# Patient Record
Sex: Female | Born: 1950 | Race: White | Hispanic: No | Smoking: Former smoker
Health system: Southern US, Community
[De-identification: ages and names within clinical notes are randomized; demographics above are authoritative.]

## PROBLEM LIST (undated history)

## (undated) DIAGNOSIS — K219 Gastro-esophageal reflux disease without esophagitis: Secondary | ICD-10-CM

## (undated) DIAGNOSIS — J439 Emphysema, unspecified: Secondary | ICD-10-CM

## (undated) DIAGNOSIS — J45909 Unspecified asthma, uncomplicated: Secondary | ICD-10-CM

## (undated) DIAGNOSIS — M797 Fibromyalgia: Secondary | ICD-10-CM

## (undated) DIAGNOSIS — R569 Unspecified convulsions: Secondary | ICD-10-CM

## (undated) DIAGNOSIS — E079 Disorder of thyroid, unspecified: Secondary | ICD-10-CM

## (undated) DIAGNOSIS — G709 Myoneural disorder, unspecified: Secondary | ICD-10-CM

## (undated) DIAGNOSIS — M1990 Unspecified osteoarthritis, unspecified site: Secondary | ICD-10-CM

## (undated) DIAGNOSIS — I10 Essential (primary) hypertension: Secondary | ICD-10-CM

## (undated) DIAGNOSIS — E785 Hyperlipidemia, unspecified: Secondary | ICD-10-CM

## (undated) DIAGNOSIS — F419 Anxiety disorder, unspecified: Secondary | ICD-10-CM

## (undated) DIAGNOSIS — D649 Anemia, unspecified: Secondary | ICD-10-CM

## (undated) DIAGNOSIS — A809 Acute poliomyelitis, unspecified: Secondary | ICD-10-CM

## (undated) HISTORY — DX: Emphysema, unspecified: J43.9

## (undated) HISTORY — DX: Hyperlipidemia, unspecified: E78.5

## (undated) HISTORY — DX: Myoneural disorder, unspecified: G70.9

## (undated) HISTORY — PX: HIATAL HERNIA REPAIR: SHX195

## (undated) HISTORY — DX: Acute poliomyelitis, unspecified: A80.9

## (undated) HISTORY — DX: Anemia, unspecified: D64.9

## (undated) HISTORY — DX: Unspecified asthma, uncomplicated: J45.909

## (undated) HISTORY — DX: Unspecified convulsions: R56.9

## (undated) HISTORY — DX: Essential (primary) hypertension: I10

## (undated) HISTORY — DX: Unspecified osteoarthritis, unspecified site: M19.90

## (undated) HISTORY — PX: APPENDECTOMY: SHX54

## (undated) HISTORY — DX: Anxiety disorder, unspecified: F41.9

## (undated) HISTORY — DX: Fibromyalgia: M79.7

## (undated) HISTORY — DX: Gastro-esophageal reflux disease without esophagitis: K21.9

## (undated) HISTORY — DX: Disorder of thyroid, unspecified: E07.9

---
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
## 1990-07-30 HISTORY — PX: ABDOMINAL HYSTERECTOMY: SHX81

## 1998-01-19 HISTORY — PX: BREAST EXCISIONAL BIOPSY: SUR124

## 1999-04-14 ENCOUNTER — Other Ambulatory Visit: Admission: RE | Admit: 1999-04-14 | Discharge: 1999-04-14 | Payer: Self-pay | Admitting: *Deleted

## 1999-04-16 ENCOUNTER — Other Ambulatory Visit: Admission: RE | Admit: 1999-04-16 | Discharge: 1999-04-16 | Payer: Self-pay | Admitting: *Deleted

## 2000-01-23 ENCOUNTER — Ambulatory Visit (HOSPITAL_COMMUNITY): Admission: RE | Admit: 2000-01-23 | Discharge: 2000-01-23 | Payer: Self-pay | Admitting: Family Medicine

## 2001-02-19 ENCOUNTER — Encounter: Payer: Self-pay | Admitting: *Deleted

## 2001-02-19 ENCOUNTER — Encounter: Admission: RE | Admit: 2001-02-19 | Discharge: 2001-02-19 | Payer: Self-pay | Admitting: *Deleted

## 2002-02-09 ENCOUNTER — Encounter: Payer: Self-pay | Admitting: Family Medicine

## 2002-02-09 ENCOUNTER — Encounter: Admission: RE | Admit: 2002-02-09 | Discharge: 2002-02-09 | Payer: Self-pay | Admitting: Family Medicine

## 2002-06-04 ENCOUNTER — Encounter: Payer: Self-pay | Admitting: Anesthesiology

## 2002-06-10 ENCOUNTER — Encounter (INDEPENDENT_AMBULATORY_CARE_PROVIDER_SITE_OTHER): Payer: Self-pay | Admitting: Specialist

## 2002-06-10 ENCOUNTER — Inpatient Hospital Stay (HOSPITAL_COMMUNITY): Admission: RE | Admit: 2002-06-10 | Discharge: 2002-06-13 | Payer: Self-pay | Admitting: Obstetrics and Gynecology

## 2002-07-30 HISTORY — PX: FEMUR DISTAL LOCKING SCREW INSERTION: SHX1593

## 2002-07-30 HISTORY — PX: OOPHORECTOMY: SHX86

## 2002-08-25 ENCOUNTER — Encounter: Admission: RE | Admit: 2002-08-25 | Discharge: 2002-08-25 | Payer: Self-pay | Admitting: Obstetrics and Gynecology

## 2002-08-25 ENCOUNTER — Encounter: Payer: Self-pay | Admitting: Obstetrics and Gynecology

## 2003-02-03 ENCOUNTER — Ambulatory Visit (HOSPITAL_BASED_OUTPATIENT_CLINIC_OR_DEPARTMENT_OTHER): Admission: RE | Admit: 2003-02-03 | Discharge: 2003-02-03 | Payer: Self-pay | Admitting: Orthopedic Surgery

## 2004-03-01 ENCOUNTER — Inpatient Hospital Stay (HOSPITAL_COMMUNITY): Admission: AD | Admit: 2004-03-01 | Discharge: 2004-03-02 | Payer: Self-pay | Admitting: Family Medicine

## 2004-03-17 ENCOUNTER — Encounter: Admission: RE | Admit: 2004-03-17 | Discharge: 2004-03-17 | Payer: Self-pay | Admitting: Family Medicine

## 2006-12-02 ENCOUNTER — Ambulatory Visit: Payer: Self-pay | Admitting: Nurse Practitioner

## 2006-12-04 ENCOUNTER — Ambulatory Visit (HOSPITAL_COMMUNITY): Admission: RE | Admit: 2006-12-04 | Discharge: 2006-12-04 | Payer: Self-pay | Admitting: Nurse Practitioner

## 2006-12-04 ENCOUNTER — Ambulatory Visit (HOSPITAL_COMMUNITY): Admission: RE | Admit: 2006-12-04 | Discharge: 2006-12-04 | Payer: Self-pay | Admitting: Family Medicine

## 2006-12-04 ENCOUNTER — Ambulatory Visit: Payer: Self-pay | Admitting: *Deleted

## 2006-12-09 ENCOUNTER — Ambulatory Visit: Payer: Self-pay | Admitting: Nurse Practitioner

## 2006-12-10 ENCOUNTER — Encounter: Admission: RE | Admit: 2006-12-10 | Discharge: 2006-12-10 | Payer: Self-pay | Admitting: Family Medicine

## 2006-12-31 ENCOUNTER — Inpatient Hospital Stay (HOSPITAL_COMMUNITY): Admission: EM | Admit: 2006-12-31 | Discharge: 2007-01-01 | Payer: Self-pay | Admitting: Emergency Medicine

## 2007-01-10 ENCOUNTER — Ambulatory Visit: Payer: Self-pay | Admitting: Internal Medicine

## 2007-01-23 ENCOUNTER — Ambulatory Visit: Admission: RE | Admit: 2007-01-23 | Discharge: 2007-01-23 | Payer: Self-pay | Admitting: Family Medicine

## 2007-02-03 ENCOUNTER — Ambulatory Visit: Payer: Self-pay | Admitting: Family Medicine

## 2007-02-03 ENCOUNTER — Encounter: Admission: RE | Admit: 2007-02-03 | Discharge: 2007-02-03 | Payer: Self-pay | Admitting: Family Medicine

## 2007-02-04 ENCOUNTER — Ambulatory Visit: Payer: Self-pay | Admitting: Family Medicine

## 2007-02-07 ENCOUNTER — Ambulatory Visit (HOSPITAL_COMMUNITY): Admission: RE | Admit: 2007-02-07 | Discharge: 2007-02-07 | Payer: Self-pay | Admitting: Cardiology

## 2007-02-17 ENCOUNTER — Encounter: Admission: RE | Admit: 2007-02-17 | Discharge: 2007-02-17 | Payer: Self-pay | Admitting: Family Medicine

## 2007-02-26 ENCOUNTER — Ambulatory Visit: Payer: Self-pay | Admitting: Internal Medicine

## 2007-04-07 ENCOUNTER — Ambulatory Visit: Payer: Self-pay | Admitting: Internal Medicine

## 2007-04-07 ENCOUNTER — Encounter (INDEPENDENT_AMBULATORY_CARE_PROVIDER_SITE_OTHER): Payer: Self-pay | Admitting: Nurse Practitioner

## 2007-04-07 LAB — CONVERTED CEMR LAB
Albumin: 4.5 g/dL (ref 3.5–5.2)
Bilirubin, Direct: 0.2 mg/dL (ref 0.0–0.3)
Cholesterol: 143 mg/dL (ref 0–200)
HDL: 37 mg/dL — ABNORMAL LOW (ref >39)
Indirect Bilirubin: 1.1 mg/dL — ABNORMAL HIGH (ref 0.0–0.9)
LDL Cholesterol: 59 mg/dL (ref 0–99)

## 2007-04-16 ENCOUNTER — Ambulatory Visit (HOSPITAL_COMMUNITY): Admission: RE | Admit: 2007-04-16 | Discharge: 2007-04-16 | Payer: Self-pay | Admitting: Family Medicine

## 2007-04-16 ENCOUNTER — Ambulatory Visit: Payer: Self-pay | Admitting: Cardiology

## 2007-04-16 ENCOUNTER — Encounter (INDEPENDENT_AMBULATORY_CARE_PROVIDER_SITE_OTHER): Payer: Self-pay | Admitting: Family Medicine

## 2007-08-12 ENCOUNTER — Ambulatory Visit: Payer: Self-pay | Admitting: Internal Medicine

## 2007-08-12 ENCOUNTER — Encounter (INDEPENDENT_AMBULATORY_CARE_PROVIDER_SITE_OTHER): Payer: Self-pay | Admitting: Nurse Practitioner

## 2007-08-12 LAB — CONVERTED CEMR LAB
Alkaline Phosphatase: 129 units/L — ABNORMAL HIGH (ref 39–117)
Creatinine, Ser: 0.81 mg/dL (ref 0.40–1.20)
Glucose, Bld: 84 mg/dL (ref 70–99)
HDL: 44 mg/dL (ref >39)
Potassium: 4.3 meq/L (ref 3.5–5.3)
VLDL: 48 mg/dL — ABNORMAL HIGH (ref 0–40)

## 2007-10-21 ENCOUNTER — Encounter (INDEPENDENT_AMBULATORY_CARE_PROVIDER_SITE_OTHER): Payer: Self-pay | Admitting: Nurse Practitioner

## 2007-10-21 ENCOUNTER — Ambulatory Visit: Payer: Self-pay | Admitting: Internal Medicine

## 2007-10-21 LAB — CONVERTED CEMR LAB
ALT: 14 units/L (ref 0–35)
AST: 17 units/L (ref 0–37)
Bilirubin, Direct: 0.2 mg/dL (ref 0.0–0.3)
LDL Cholesterol: 60 mg/dL (ref 0–99)
Total Bilirubin: 1.4 mg/dL — ABNORMAL HIGH (ref 0.3–1.2)
Total CHOL/HDL Ratio: 3.6
Triglycerides: 207 mg/dL — ABNORMAL HIGH (ref <150)
VLDL: 41 mg/dL — ABNORMAL HIGH (ref 0–40)

## 2007-11-18 ENCOUNTER — Ambulatory Visit: Payer: Self-pay | Admitting: Family Medicine

## 2008-01-20 ENCOUNTER — Ambulatory Visit: Payer: Self-pay | Admitting: Internal Medicine

## 2008-01-20 ENCOUNTER — Encounter (INDEPENDENT_AMBULATORY_CARE_PROVIDER_SITE_OTHER): Payer: Self-pay | Admitting: Nurse Practitioner

## 2008-01-20 LAB — CONVERTED CEMR LAB
Cholesterol: 145 mg/dL (ref 0–200)
LDL Cholesterol: 71 mg/dL (ref 0–99)

## 2008-01-21 ENCOUNTER — Encounter: Admission: RE | Admit: 2008-01-21 | Discharge: 2008-01-21 | Payer: Self-pay | Admitting: Obstetrics and Gynecology

## 2008-03-22 ENCOUNTER — Ambulatory Visit: Payer: Self-pay | Admitting: Internal Medicine

## 2008-03-22 ENCOUNTER — Encounter (INDEPENDENT_AMBULATORY_CARE_PROVIDER_SITE_OTHER): Payer: Self-pay | Admitting: Family Medicine

## 2008-03-22 LAB — CONVERTED CEMR LAB
Cholesterol: 145 mg/dL (ref 0–200)
Triglycerides: 218 mg/dL — ABNORMAL HIGH (ref <150)
VLDL: 44 mg/dL — ABNORMAL HIGH (ref 0–40)

## 2008-04-06 ENCOUNTER — Ambulatory Visit: Payer: Self-pay | Admitting: Family Medicine

## 2008-04-06 LAB — CONVERTED CEMR LAB
Albumin: 4.7 g/dL (ref 3.5–5.2)
Basophils Absolute: 0 10*3/uL (ref 0.0–0.1)
CO2: 24 meq/L (ref 19–32)
CRP: 0.1 mg/dL (ref <0.6)
Calcium: 9.1 mg/dL (ref 8.4–10.5)
Chloride: 106 meq/L (ref 96–112)
Eosinophils Relative: 2 % (ref 0–5)
Lymphocytes Relative: 35 % (ref 12–46)
Lymphs Abs: 2.4 10*3/uL (ref 0.7–4.0)
MCHC: 32.2 g/dL (ref 30.0–36.0)
MCV: 89.4 fL (ref 78.0–100.0)
Monocytes Relative: 7 % (ref 3–12)
Neutro Abs: 3.8 10*3/uL (ref 1.7–7.7)
Neutrophils Relative %: 55 % (ref 43–77)
Potassium: 4.2 meq/L (ref 3.5–5.3)
RDW: 13.8 % (ref 11.5–15.5)
Sodium: 143 meq/L (ref 135–145)
Vit D, 1,25-Dihydroxy: 30 (ref 30–89)
WBC: 6.8 10*3/uL (ref 4.0–10.5)

## 2008-04-19 ENCOUNTER — Ambulatory Visit: Payer: Self-pay | Admitting: Family Medicine

## 2008-07-19 ENCOUNTER — Ambulatory Visit: Payer: Self-pay | Admitting: Family Medicine

## 2008-08-20 ENCOUNTER — Ambulatory Visit: Payer: Self-pay | Admitting: Family Medicine

## 2008-09-18 IMAGING — CR DG FEET 3 VIEWS BILAT
6 series · 6 of 6 positions shown · non-contrast
Comparison: none

CLINICAL DATA: Bilateral 1st metatarsal deformity.  No injury.  Chronic pain.
 LEFT FOOT ? 3 VIEW:

[t foot ap left]
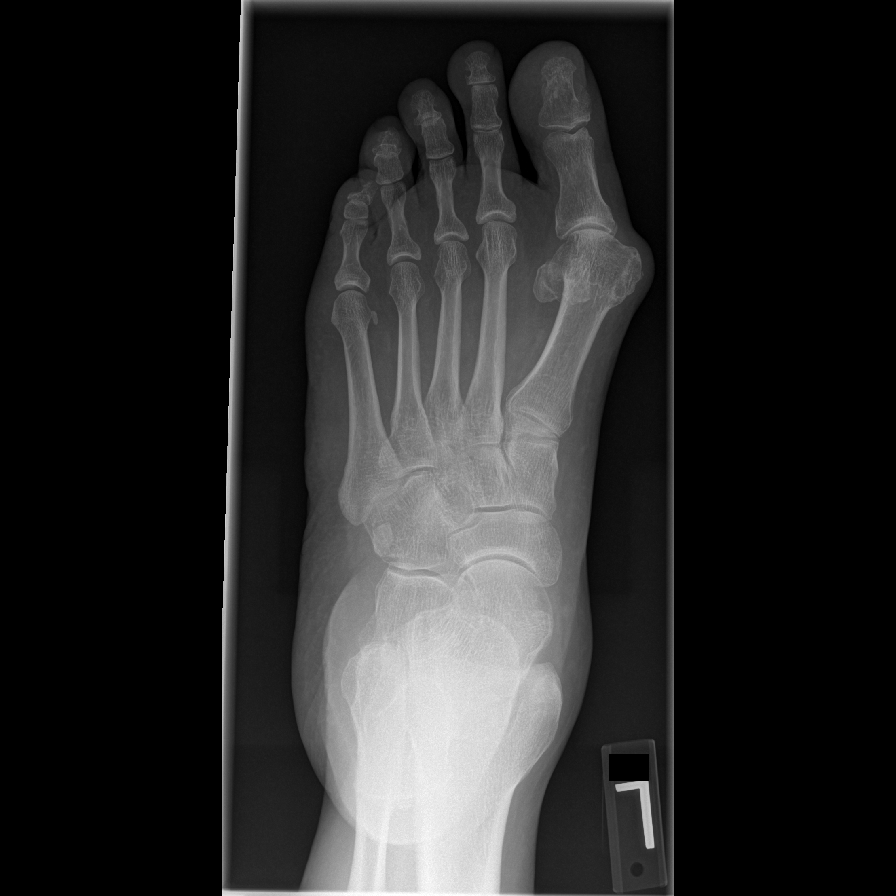

[t foot oblique left]
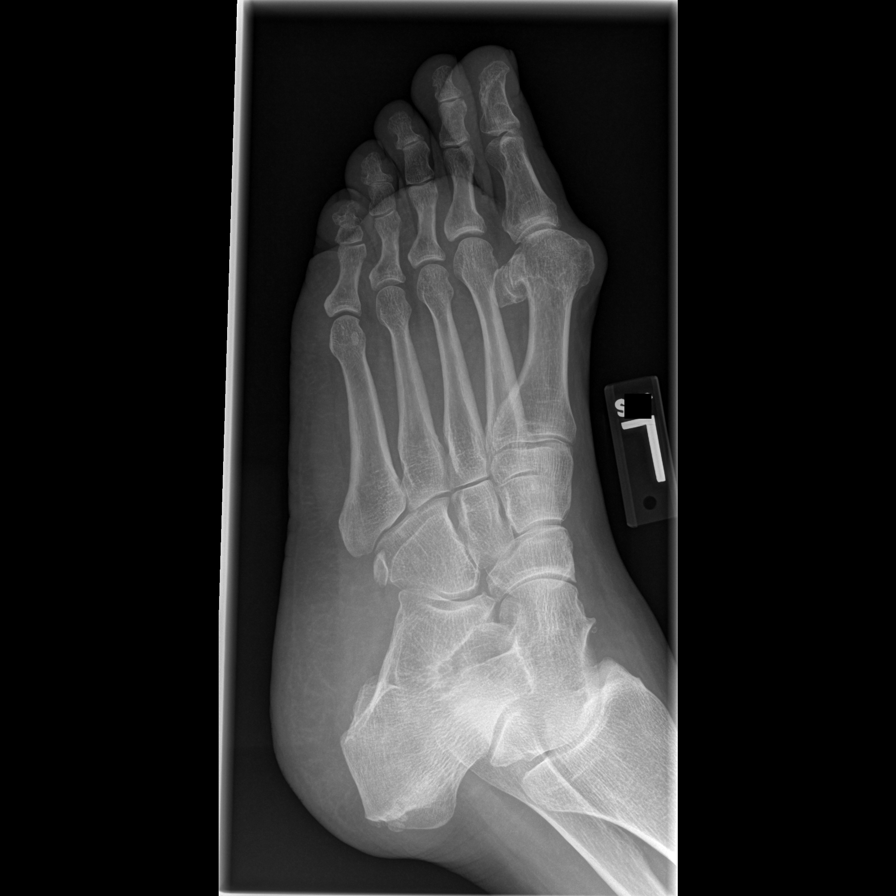

[t foot lat left]
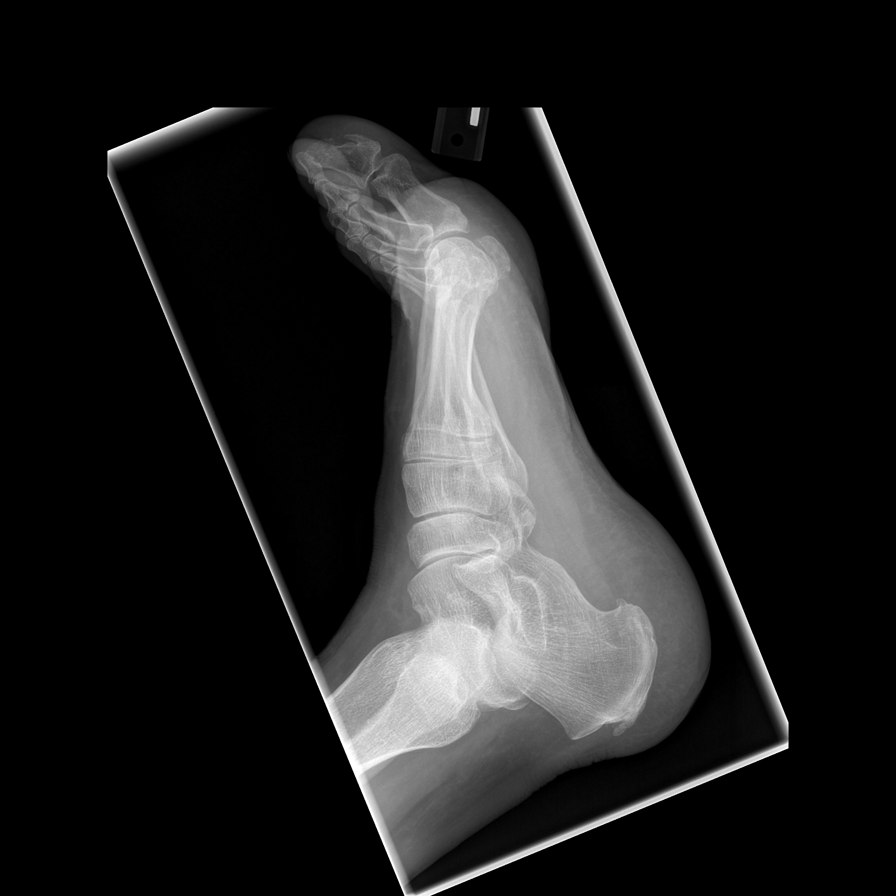

[t foot ap right]
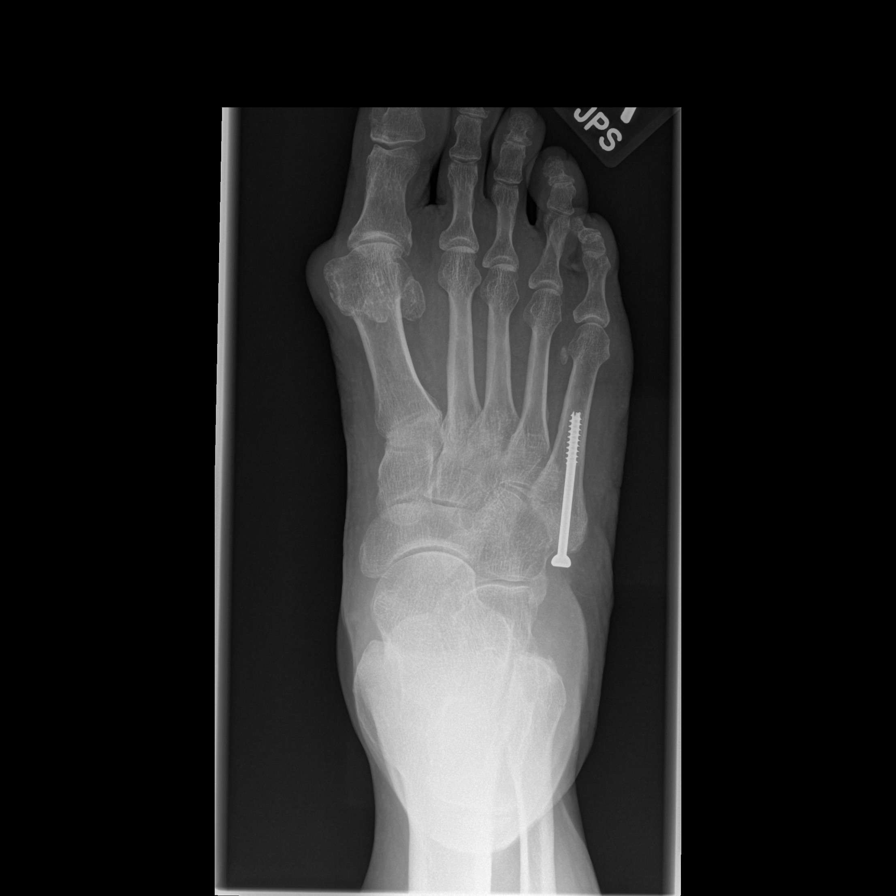

[t foot oblique right]
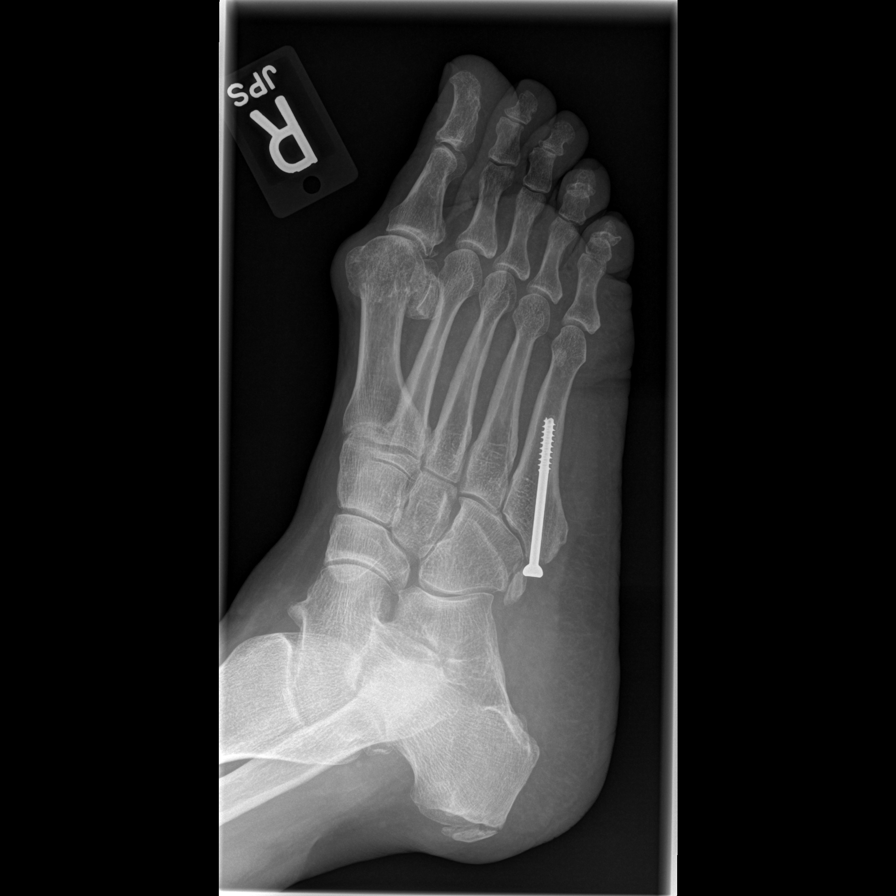

[t foot lat right]
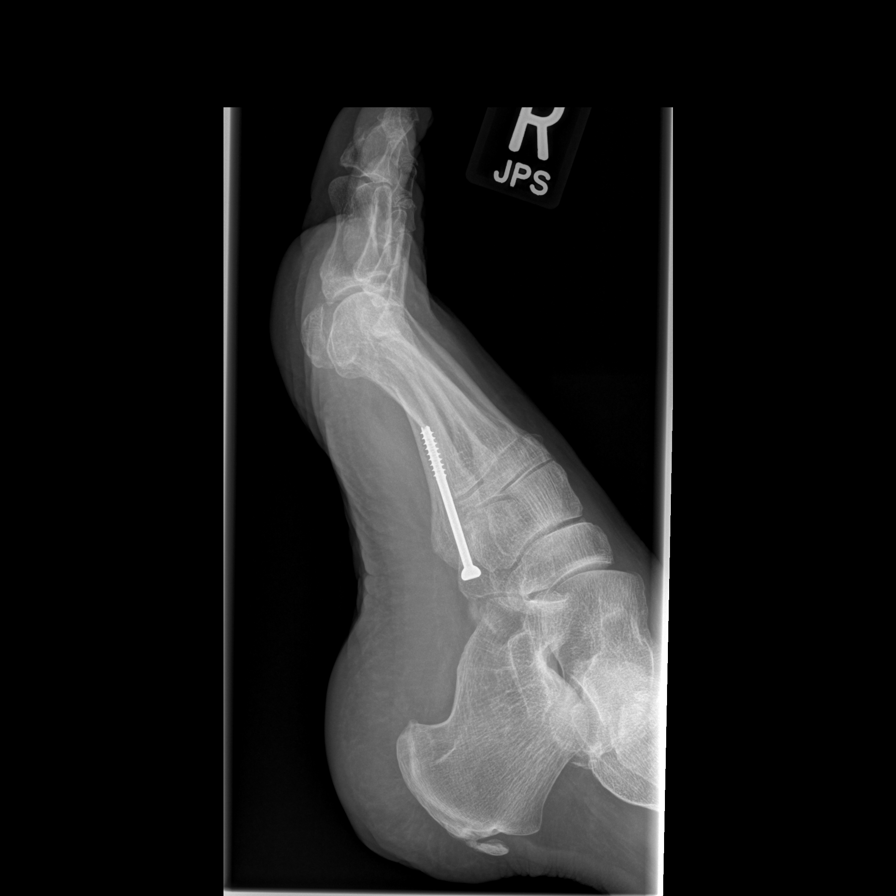

[6 of 6 positions shown; findings below may reference images not displayed]

FINDINGS: There are changes of bunion associated with the left 1st metatarsophalangeal joint with overgrowth of the left 1st metatarsal head and hallux valgus deformity.  The remainder of the study is unremarkable aside for a plantar calcaneal spur and exostosis associated with the 1st toe distal phalanx.
IMPRESSION: Changes of a bunion with hallux valgus deformity associated with the left 1st metatarsophalangeal joint.  
 RIGHT FOOT ? 3 VIEW:
FINDINGS: There are changes of a bunion associated with the right 1st metatarsophalangeal joint with hallux valgus deformity and overgrowth of the 1st metatarsal head.  There has been previous screw fixation of a probable proximal right 5th metatarsal fracture (now healed).  There are no erosive or destructive changes.  There is an exostosis associated with the distal phalanx right 1st toe.
IMPRESSION: Changes of a bunion associated with the right 1st metatarsophalangeal joint as discussed above.

## 2008-11-19 ENCOUNTER — Ambulatory Visit (HOSPITAL_COMMUNITY): Admission: RE | Admit: 2008-11-19 | Discharge: 2008-11-19 | Payer: Self-pay | Admitting: Family Medicine

## 2008-11-19 ENCOUNTER — Ambulatory Visit: Payer: Self-pay | Admitting: Family Medicine

## 2008-12-23 ENCOUNTER — Ambulatory Visit: Payer: Self-pay | Admitting: Family Medicine

## 2009-01-21 ENCOUNTER — Encounter: Admission: RE | Admit: 2009-01-21 | Discharge: 2009-01-21 | Payer: Self-pay | Admitting: Family Medicine

## 2009-01-24 ENCOUNTER — Encounter: Admission: RE | Admit: 2009-01-24 | Discharge: 2009-01-24 | Payer: Self-pay | Admitting: Family Medicine

## 2009-03-28 ENCOUNTER — Ambulatory Visit (HOSPITAL_COMMUNITY): Admission: RE | Admit: 2009-03-28 | Discharge: 2009-03-28 | Payer: Self-pay | Admitting: Family Medicine

## 2009-03-28 ENCOUNTER — Ambulatory Visit: Payer: Self-pay | Admitting: Family Medicine

## 2009-06-17 ENCOUNTER — Ambulatory Visit: Payer: Self-pay | Admitting: Family Medicine

## 2009-06-17 LAB — CONVERTED CEMR LAB
Cholesterol: 190 mg/dL (ref 0–200)
Rhuematoid fact SerPl-aCnc: <20 intl units/mL (ref 0–20)

## 2009-06-20 ENCOUNTER — Ambulatory Visit (HOSPITAL_COMMUNITY): Admission: RE | Admit: 2009-06-20 | Discharge: 2009-06-20 | Payer: Self-pay | Admitting: Family Medicine

## 2009-11-15 ENCOUNTER — Ambulatory Visit: Payer: Self-pay | Admitting: Family Medicine

## 2009-11-15 LAB — CONVERTED CEMR LAB
Basophils Absolute: 0 10*3/uL (ref 0.0–0.1)
Basophils Relative: 0 % (ref 0–1)
CO2: 28 meq/L (ref 19–32)
Calcium: 9 mg/dL (ref 8.4–10.5)
Cholesterol: 180 mg/dL (ref 0–200)
Creatinine, Ser: 0.84 mg/dL (ref 0.40–1.20)
Eosinophils Absolute: 0.1 10*3/uL (ref 0.0–0.7)
Hemoglobin: 10.7 g/dL — ABNORMAL LOW (ref 12.0–15.0)
Lymphs Abs: 1.7 10*3/uL (ref 0.7–4.0)
Neutro Abs: 2.4 10*3/uL (ref 1.7–7.7)
Neutrophils Relative %: 51 % (ref 43–77)
Platelets: 180 10*3/uL (ref 150–400)
Potassium: 4.5 meq/L (ref 3.5–5.3)
RDW: 18.8 % — ABNORMAL HIGH (ref 11.5–15.5)
Total CHOL/HDL Ratio: 4.6
Total Protein: 7 g/dL (ref 6.0–8.3)
Valproic Acid Lvl: 70.2 ug/mL (ref 50.0–100.0)
Vit D, 25-Hydroxy: 23 ng/mL — ABNORMAL LOW (ref 30–89)

## 2009-12-01 ENCOUNTER — Emergency Department (HOSPITAL_COMMUNITY): Admission: EM | Admit: 2009-12-01 | Discharge: 2009-12-01 | Payer: Self-pay | Admitting: Emergency Medicine

## 2010-02-10 ENCOUNTER — Ambulatory Visit (HOSPITAL_COMMUNITY): Admission: RE | Admit: 2010-02-10 | Discharge: 2010-02-10 | Payer: Self-pay | Admitting: Family Medicine

## 2010-06-06 ENCOUNTER — Encounter (INDEPENDENT_AMBULATORY_CARE_PROVIDER_SITE_OTHER): Payer: Self-pay | Admitting: Family Medicine

## 2010-06-06 LAB — CONVERTED CEMR LAB
HCT: 42.2 % (ref 36.0–46.0)
HCV Ab: NEGATIVE
Hep A Total Ab: NEGATIVE
Hep B Core Total Ab: NEGATIVE
MCHC: 31 g/dL (ref 30.0–36.0)
MCV: 93.4 fL (ref 78.0–100.0)
Magnesium: 1.9 mg/dL (ref 1.5–2.5)
RBC: 4.52 M/uL (ref 3.87–5.11)
Vit D, 25-Hydroxy: 32 ng/mL (ref 30–89)

## 2010-06-08 ENCOUNTER — Ambulatory Visit (HOSPITAL_COMMUNITY)
Admission: RE | Admit: 2010-06-08 | Discharge: 2010-06-08 | Payer: Self-pay | Source: Home / Self Care | Admitting: Family Medicine

## 2010-08-20 ENCOUNTER — Encounter: Payer: Self-pay | Admitting: Family Medicine

## 2010-10-17 LAB — HEMOCCULT GUIAC POC 1CARD (OFFICE): Fecal Occult Bld: POSITIVE

## 2010-10-17 LAB — COMPREHENSIVE METABOLIC PANEL
Albumin: 3.9 g/dL (ref 3.5–5.2)
BUN: 9 mg/dL (ref 6–23)
Creatinine, Ser: 0.81 mg/dL (ref 0.4–1.2)
Total Bilirubin: 1.1 mg/dL (ref 0.3–1.2)
Total Protein: 7.4 g/dL (ref 6.0–8.3)

## 2010-10-17 LAB — DIFFERENTIAL
Basophils Absolute: 0 10*3/uL (ref 0.0–0.1)
Lymphocytes Relative: 17 % (ref 12–46)
Monocytes Absolute: 0.4 10*3/uL (ref 0.1–1.0)
Neutro Abs: 6.1 10*3/uL (ref 1.7–7.7)

## 2010-10-17 LAB — CBC
HCT: 35 % — ABNORMAL LOW (ref 36.0–46.0)
MCV: 80.6 fL (ref 78.0–100.0)
Platelets: 172 10*3/uL (ref 150–400)
RDW: 16.9 % — ABNORMAL HIGH (ref 11.5–15.5)

## 2010-10-17 LAB — PROTIME-INR: INR: 1.09 (ref 0.00–1.49)

## 2010-10-17 LAB — APTT: aPTT: 26 seconds (ref 24–37)

## 2010-10-17 LAB — ETHANOL: Alcohol, Ethyl (B): <5 mg/dL (ref 0–10)

## 2010-12-12 NOTE — Discharge Summary (Signed)
NAME:  Tamara Chambers, Tamara Chambers              ACCOUNT NO.:  [id number]   MEDICAL RECORD NO.:  [id number]          PATIENT TYPE:  INP   LOCATION:  3741                         FACILITY:  [facility]   PHYSICIAN:  Herbie Saxon, MDDATE OF BIRTH:  21-Oct-1950   DATE OF ADMISSION:  12/31/2006  DATE OF DISCHARGE:  01/01/2007                         DISCHARGE SUMMARY - REFERRING   PRIMARY CARE PHYSICIAN:  Dr. Maurine Minister, at [facility].   CARDIOLOGIST:  [facility].   RADIOLOGY:  The chest x-ray of December 31, 2006, shows right middle lobe  atelectasis otherwise negative study.   HOSPITAL COURSE:  This 60 year old Caucasian lady presented to the  emergency room complaining of retrosternal chest pain which has been  ongoing for four days associated with some increased wheezing and  indigestion symptoms.  She does have a past medical history of  gastroesophageal reflux disease, bronchial asthma.  Patient's serial  cardiac enzymes have been negative.  EKGs also not showing any acute  changes.  She has been afebrile.  Atypical chest pain is, at present,  controlled.  Patient is not hemodynamically unstable.  She is clinically  improved with bronchodilator therapy and proton pump inhibitors.  Patient also has a past medical history of anxiety and depression for  which she is on Cymbalta and Xanax.  Her lipid panel did show that she  has dyslipidemia with HDL of 28 and triglycerides 220 with a VLDL of 44.  She is being discharged home in stable condition.   DIET:  Cardiac, heart healthy, low fat, low sodium, low cholesterol,  1800 calorie ADA diet.   ACTIVITY:  As tolerated.   FOLLOW UP:  1. With primary care physician at [facility] in one week.  2. [facility] in 3-5 days for possible stress echo as an      outpatient.   DISCHARGE MEDICATIONS:  1. Aspirin 162 mg daily.  2. Niacin 100 mg p.o. b.i.d.  3. Nitroglycerin 0.4 mg sublingual p.r.n.  4. DuoNebs 3 mL q.6h. p.r.n.  5. AcipHex 20 mg b.i.d.  6. Xanax 0.25 mg b.i.d.  7. Ultram 50 mg q.6h. p.r.n.  8. Trazodone 50 mg daily.  9. Lisinopril 10 mg daily.  10.AcipHex 20 mg b.i.d.  11.Cymbalta 90 mg nightly.  12.Pravachol 40 mg daily.  13.Ventolin inhaler 2 puffs q.6h. p.r.n.  14.Metformin 500 mg daily.   DISCHARGE DIAGNOSES:  1. Atypical chest pain, acute coronary syndrome unlikely.  2. Hypertension, controlled.  3. Depression, anxiety.  4. Hyperlipidemia.  5. Gastroesophageal reflux disease.  6. Bronchial asthma.  7. History of diverticulitis.  8. History of seizure disorder in 2001.  9. Mild hyperglycemia, rule out new onset diabetes.   PHYSICAL EXAMINATION:  GENERAL APPEARANCE:  She is a middle-aged lady  not in acute distress.  VITAL SIGNS:  Temperature 98, pulse 56, respiratory rate 20, blood  pressure 126/89.  HEENT:  Pupils are equal, round, reactive to light and accommodation.  Extraocular muscles are intact.  Oropharynx and nasopharynx are clear.  Moist mucous membranes.  Head is atraumatic and normocephalic.  NECK:  There is no elevated JVD or carotid bruit.  No  thyromegaly.  Neck  is supple.  CHEST:  Few expiratory rhonchi.  CARDIOVASCULAR:  Heart sounds 1 and 2 regular.  ABDOMEN:  Soft, nontender, no organomegaly.  Normoactive bowel sounds.  EXTREMITIES:  Peripheral pulses present, no pedal edema.  NEUROLOGIC:  She is alert and oriented x3.  Cranial nerves II-XII  intact.   LABORATORY DATA:  CK 95, MB fraction 1.5, troponin 0.01.  Glucose 158,  sodium 141, potassium 3.8, chloride 104, bicarbonate 30, BUN 11,  creatinine 0.9.  A wbc of 6, hematocrit 40, platelet count 219.   Patient is to be worked up as possible diabetes as outpatient. At  present, I will start her on metformin 500 mg daily.  This is to be  reviewed by the primary care physician as an outpatient.   Patient ,s medication and tests and need for follow-up with the primary  care physician and the cardiac clinic.   She verbalizes understanding.      Herbie Saxon, MD  Electronically Signed     MIO/MEDQ  D:  01/01/2007  T:  01/01/2007  Job:  [id number]

## 2010-12-12 NOTE — Cardiovascular Report (Signed)
NAME:  Tamara Chambers, Tamara Chambers              ACCOUNT NO.:  [id number]   MEDICAL RECORD NO.:  [id number]          PATIENT TYPE:  OIB   LOCATION:  2899                         FACILITY:  [facility]   PHYSICIAN:  Cristy Hilts. Jacinto Halim, MD       DATE OF BIRTH:  [birth year]   DATE OF PROCEDURE:  02/07/2007  DATE OF DISCHARGE:  02/07/2007                            CARDIAC CATHETERIZATION   PROCEDURE PERFORMED:  1. Left ventriculography.  2. Selective left coronary aortography.  3. Abdominal aortogram.   INDICATIONS:  Ms. Tamara Chambers is a 60 year old female with history  of hypertension, GERD, childhood polio, history of duodenal ulcers in  the past, COPD, asthma, smoking, quit 3 years ago.  She also has history  of anxiety and depression and panic attacks, has been complaining of  chest discomfort.  She had undergone a stress Myoview which had shown  inferior apical ischemia, which was very subtle on the  low risk study.  Because of her persistent symptoms, she was given the option to evaluate  her coronary anatomy versus continue medical care.  Given this, after  obtaining informed consent, she was brought to the cardiac  catheterization lab to evaluate her coronary anatomy.  Abdominal  aortogram was performed to evaluate for abdominal aortic  atherosclerosis.   HEMODYNAMIC DATA:  The ventricular pressures were 108/7 with an end-  diastolic pressure of 9 mmHg.  The aortic pressure 108/58 with a mean of  81 mmHg.  There was no pressure gradient across the aortic valve.   ANGIOGRAPHIC DATA:  Left ventricle:  Left ventricular systolic function  was normal with ejection fraction of 65%.  There was no significant  mitral regurgitation.   Right coronary artery:  Right coronary artery is a dominant vessel.  It  is smooth and normal.   Left main:  Left main is large caliber vessel, appears smooth and  normal.   Circumflex:  Circumflex is a large caliber vessel.  Gives origin to  large obtuse marginal one.   Smooth and normal.   LAD.  LAD is a large caliber vessel.  Gives origin to a moderate-sized  diagonal 1 and a moderate-to-large sized diagonal 2.  The LAD and the  diagonals are smooth and normal.   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  They were widely patent.   IMPRESSION:  Normal coronary arteries and normal hemodynamics of the  left ventricle and normal abdominal aorta.  No evidence of renal artery  stenosis or abdominal aortic aneurysm.   RECOMMENDATIONS:  The patient has been reassured.  She will need primary  prevention strategy.  He will follow up with a pulmonary Urgent Care for  further management and evaluation, and a total of 100 mL of contrast was  utilized for diagnostic angiography.   TECHNIQUE OF PROCEDURE:  Under usual sterile precautions using a 6-  French right femoral arterial access, a 6-French multi-purpose B #2  catheter was advanced into the ascending aorta and then into the left  ventricle over a J-wire.  The left ventriculography was performed  in  both LAO and RAO projection.  Catheter was flushed with saline, pulled  back in the ascending aorta and right coronary artery was selectively  engaged, and angiography was performed.  The left main coronary artery  with selective engaged angiography was performed, then the catheter was  pulled back, and the abdominal aortogram was performed.  The catheter  was pulled out of the body in the usual fashion.  Right femoral  angiography was performed through the arterial access sheath, and the  access closed with StarClose with excellent hemostasis.  The patient  tolerated the procedure.  No immediate complication noted.      Cristy Hilts. Jacinto Halim, MD  Electronically Signed     JRG/MEDQ  D:  02/07/2007  T:  02/08/2007  Job:  [id number]
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
## 2010-12-15 NOTE — H&P (Signed)
NAME:  Tamara Chambers, Tamara Chambers                        ACCOUNT NO.:  [id number]   MEDICAL RECORD NO.:  [id number]                   PATIENT TYPE:  INP   LOCATION:  NA                                   FACILITY:  [facility]   PHYSICIAN:  Katherine Roan, M.D.               DATE OF BIRTH:  Dec 08, 1950   DATE OF ADMISSION:  06/09/2002  DATE OF DISCHARGE:                                HISTORY & PHYSICAL   CHIEF COMPLAINT:  Abdominal pain.   HISTORY OF PRESENT ILLNESS:  The patient is a 60 year old gravida 2, para 1  female who presents for a laparotomy for pelvic pain and bilateral ovarian  cysts.  She had a vaginal hysterectomy in 1992 and was evaluated with  tenderness in her left lower quadrant, found to have symptoms and signs of  diverticulitis as well as a large left ovarian cyst measuring about 5 cm.  We have quieted down the diverticulitis and she continues to have the  ovarian cyst.  CA 125 is normal.  Because of the discomfort, laparotomy with  removal of both ovaries has been presented to the patient and she has  agreed.   PAST MEDICAL HISTORY:  She takes albuterol for asthma.  She has a large  hiatal hernia for which she takes Aciphex.  She also takes Paxil for  depression and Xanax for anxiety.  She had a history of a tonsillectomy at  age 47, a C-section in 1984 for a failure to progress and fetal distress  done by Dr. Barbera Setters.   ALLERGIES:  No known drug allergies.   REVIEW OF SYSTEMS:  HEENT:  She has sinus headaches.  Denies migraines,  denies decrease in vision or auditory acuity.  She wears contacts.  No  dizziness.  HEART:  No history of hypertension, no chest pain.  She denies  history of mitral valve prolapse.  LUNGS:  She is an asthmatic, takes  albuterol once or twice a day.  No acute exacerbations in the last year.  GU:  She denies stress incontinence.  GI:  She denies bowel habit change,  but has had a history of diverticulitis and has been recently evaluated.  MUSCLES, BONES, AND JOINTS:  No fractures or arthritis.   SOCIAL HISTORY:  She works at [employer].  She drinks alcohol  socially and reports that she smokes one pack of cigarettes daily.   FAMILY HISTORY:  Her mother is 58, living and well.  Father died at 44 of a  liver disease.  She has three brothers who are living and well, two sisters  who are living and well and two with thyroid disease.  She has a strong  family history of heart disease in her paternal uncles and maternal  grandfather.  She has a sister that is borderline diabetic.   PHYSICAL EXAMINATION:  GENERAL:  A well-developed, nourished, alert and  cooperative 60 year old female who  appears to be her stated age.  Is in no  acute distress.  Is oriented to time, place, and recent events.  VITAL SIGNS:  Weight is 203, blood pressure 120/80, pulse 80.  HEENT:  Unremarkable.  The oropharynx is not injected.  NECK:  Supple.  Carotid pulses are equal without bruits.  The thyroid is not  enlarged.  I detect no adenopathy.  LUNGS:  Clear to P&A.  BREASTS:  No masses or tenderness.  HEART:  Normal sinus rhythm, no murmurs.  No heaves, thrills, rubs, or  gallops.  ABDOMEN:  The abdomen is on plain.  There is a low transverse incision which  is well healed.  Femoral pulses are equal, bowel sounds are normal.  Liver,  spleen, or kidneys are not palpated.  PELVIC:  Reveals a 4-5 cm left adnexal mass which is tender.  Rectovaginal  confirms.  I cannot feel a small cyst on the right ovary.  EXTREMITIES:  Show good range of motion, equal pulses and reflexes.   IMPRESSION:  Cystic ovaries with abdominal pain, history of diverticulitis.   PLAN:  Laparotomy with removal of both ovaries.  Detailed informed consent  has been given to the patient in the form of risks of infection, blood loss,  damage to bladder, bowel, and vascular injuries.  She has also been  appraised of the risks of blood transfusion including West Nile  virus as  well as AIDS and hepatitis.                                               Katherine Roan, M.D.    SDM/MEDQ  D:  06/09/2002  T:  06/09/2002  Job:  [id number]
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
## 2010-12-15 NOTE — Op Note (Signed)
NAME:  Tamara Chambers, Tamara Chambers                        ACCOUNT NO.:  [id number]   MEDICAL RECORD NO.:  [id number]                   PATIENT TYPE:  AMB   LOCATION:  DSC                                  FACILITY:  [facility]   PHYSICIAN:  Harvie Junior, M.D.                DATE OF BIRTH:  1951-04-01   DATE OF PROCEDURE:  02/03/2003  DATE OF DISCHARGE:                                 OPERATIVE REPORT   PREOPERATIVE DIAGNOSIS:  Fracture of fifth metatarsal base with  displacement.   POSTOPERATIVE DIAGNOSIS:  Fracture of fifth metatarsal base with  displacement.   PROCEDURE:  Open reduction and internal fixation of the fifth base of the  metatarsal fracture.   SURGEON:  Harvie Junior, M.D.   ASSISTANT:  Marshia Ly, P.A.   ANESTHESIA:  General.   BRIEF HISTORY:  The patient is a 60 year old female with a history of having  a twisting type injury to her foot.  She ultimately suffered a displaced  fifth metatarsal fracture.  She is followed by Dr. Salvadore Farber over at the  [facility].  She ultimately on x-ray was noted to have distraction  of her fifth metatarsal fragment.  After a discussion with Dr. Salvadore Farber  who was concerned about need for surgical intervention, she was sent over  for evaluation.  Discussion at that time was centered around persistent  nonoperative treatment versus operative intervention.  She ultimately  elected to undergo operative intervention and she was brought to the  operating room for this procedure.   DESCRIPTION OF PROCEDURE:  The patient was taken to the operating room and  after undergoing general anesthesia, the patient was placed on the operating  table.  The right leg was then prepped and draped in the usual sterile  fashion.  Following this, an incision was made over the fifth metatarsal.  Subcutaneous tissues were taken down to the level of the fracture.  The  fracture was identified.  The healing elements were removed and the fracture  was  anatomically reduced.  A guide-wire was then advanced down the shaft in  both the AP and lateral.  Following that, a Synthes 4.5 mm threaded screw  was used to advance down the shaft and excellent bony fixation was achieved  as well as compression.  Fluoroscopy was used throughout the case to insure  there was anatomic alignment of the fracture and compression across the  fracture site and once this had been achieved, the patient had her wounds  copiously irrigated and suctioned dry.  The skin was closed with 4-0 nylon  interrupted sutures.  A sterile compressive dressing was applied at this  point as well as a posterior plaster.  The patient was taken to the recovery  room and was noted to be in satisfactory condition.  Estimated blood loss  was none.  Harvie Junior, M.D.    Ranae Plumber  D:  02/03/2003  T:  02/03/2003  Job:  [id number]   cc:   Delano Metz, M.D.  [address]  [place]  [place] [address]  Fax: [phone]

## 2010-12-15 NOTE — Discharge Summary (Signed)
NAME:  Tamara Chambers, Tamara Chambers                        ACCOUNT NO.:  [id number]   MEDICAL RECORD NO.:  [id number]                   PATIENT TYPE:  INP   LOCATION:  3731                                 FACILITY:  [facility]   PHYSICIAN:  Leighton Roach McDiarmid, M.D.             DATE OF BIRTH:  14-Jul-1951   DATE OF ADMISSION:  03/01/2004  DATE OF DISCHARGE:  03/02/2004                                 DISCHARGE SUMMARY   DISCHARGE DIAGNOSES:  1. Chest pain, noncardiac in nature.  2. Hypertension.  3. Depression.  4. Tobacco abuse.  5. Anxiety.   DISCHARGE MEDICATIONS:  1. Minocycline 50 mg p.o. every day.  2. Aciphex 40 mg one p.o. every day.  3. Aspirin 81 mg one p.o. every day.  4. Hydrochlorothiazide 12.5 mg one p.o. every day.  5. Paxil 20 mg one p.o. every day.  6. Lisinopril 10 mg one p.o. every day.  7. Estrogen patch one use as directed.  8. Gemfibrozil 600 mg p.o. b.i.d.   CONSULTATIONS:  Smoking cessation.   PROCEDURES:  EKG that was normal.   DISPOSITION:  Discharge to home.   FOLLOW UP:  Followup will be with urgent care with Dr. Delano Metz, on  Friday, March 10, 2004, after 2 p.m. and [facility]  for a cardiac stress test on March 08, 2004 at 8:45 a.m.   HISTORY OF PRESENT ILLNESS:  This is a 60 year old female who, after several  months of difficulty at work, became upset after a boss suggested that she  look for other work while sitting at her computer.  Looking for other work,  she experienced severe burning chest pain substernal in nature that radiated  to her left arm and with sharp bursts of pain into her neck.  She was  flushed and sweating and also felt nauseated and dizzy and felt confused.  After two hours, she was concerned and became increasingly upset, so she  called her [facility]  ref[facility] her for admission for a rule out chest pain.  Her pain is now  resolved with only minimal burning in  her left chest.  Please see the  dictated H&P for a full history and physical.   HOSPITAL COURSE:  1. Chest pain.  The patient underwent three sets of cardiac enzyme testing     all of which were negative.  She also had a normal basic metabolic panel     and CBC.  She did have a TSH drawn which was within normal limits at     1.847 and a lipid profile with a total cholesterol of 153, triglycerides     of 325, HDL cholesterol of 28, and LDL cholesterol of 60.  The patient     was discharged after ruling out for a cardiac cause of her chest pain but     she will undergo stress  testing at [facility] on     March 08, 2004, after discharge.  She is to continue on her     hydrochlorothiazide, Lisinopril, and was started on gemfibrozil as her     Lipitor apparently was not controlling her triglycerides and bringing up     her HDL cholesterol.  2. Hypertension.  She was continued with good control on hydrochlorothiazide     and Lisinopril.  3. Depression.  She was continued on her Paxil.  4. Tobacco abuse.  She underwent a smoking cessation consult and we have     advised her to continue smoking cessation measures as she does have some     risk factors for coronary artery disease.  5. Anxiety.  The patient's pain did resolve after administration of a very     low dose of Xanax.  After she did rule out for cardiac cause of her chest     pain, we determined that it was likely to be secondary to anxiety related     to her greatly increased social stressors in the previous several months.     We have suggested that she obtain psychological counseling as an     outpatient either with a professional psychologist or in her church where     she is deeply involved with, and she is agreeable at this time.      Ursula Beath, MD                     Leighton Roach McDiarmid, M.D.    JT/MEDQ  D:  03/03/2004  T:  03/04/2004  Job:  [id number]   cc:   Delano Metz, M.D.  [address]  [place]  [place] [address]  Fax: [phone]   [facility]

## 2010-12-15 NOTE — Discharge Summary (Signed)
   NAME:  Tamara Chambers, Tamara Chambers                        ACCOUNT NO.:  [id number]   MEDICAL RECORD NO.:  [id number]                   PATIENT TYPE:  INP   LOCATION:  0465                                 [facility]ACILITY:  [facility]   PHYSICIAN:  Katherine Roan, M.D.               DATE OF BIRTH:  10-25-1950   DATE OF ADMISSION:  06/10/2002  DATE OF DISCHARGE:  06/13/2002                                 DISCHARGE SUMMARY   ADMISSION DIAGNOSIS:  Pelvic pain, cystic ovaries.   DISCHARGE DIAGNOSIS:  Pelvic pain, cystic ovaries.   OPERATION PERFORMED:  Exploratory laparotomy, bilateral salpingo-  oophorectomy, appendectomy.   BRIEF HISTORY:  The patient is a 60 year old female status post vaginal  hysterectomy who was found to have a left adnexal mass on routine scan when  she was worked up for diverticulitis.  The mass in the ovary was about 4 cm.  At the time she had an acute diverticular attack and it was felt best to put  this on quiescent prior to surgical procedure.  She was then admitted for  definitive care.  She continued to have intermittent pelvic pain.  Admission  hemoglobin 14, echocardiogram was essentially normal as was a chest x-ray.  She had a history of asthma.   HOSPITAL COURSE:  The patient was admitted to the hospital and underwent an  uneventful pelvic exam under anesthesia, exploratory laparotomy, bilateral  salpingo-oophorectomy and appendectomy, the findings of benign ovarian cyst  and a normal appendix.  She was kept an extra day in the hospital because  her incision was draining bloody fluid which we thought was from the  irrigation.  Incision was intact.  After she was observed for 24 hours with  no drainage, she was discharged to be followed in the office in two weeks.  She was to call for fever, bleeding or any other difficulty.  Pathology  report revealed benign findings.   DISCHARGE CONDITION:  Improved.                                               Katherine Roan,  M.D.    SDM/MEDQ  D:  06/22/2002  T:  06/22/2002  Job:  [id number]

## 2010-12-15 NOTE — Op Note (Signed)
NAME:  Tamara Chambers, Tamara Chambers                        ACCOUNT NO.:  [id number]   MEDICAL RECORD NO.:  [id number]                   PATIENT TYPE:  INP   LOCATION:  NA                                   [facility]   PHYSICIAN:  Katherine Roan, M.D.               DATE OF BIRTH:  06-26-1951   DATE OF PROCEDURE:  06/10/2002  DATE OF DISCHARGE:                                 OPERATIVE REPORT   PREOPERATIVE DIAGNOSES:  Ovarian cyst, pelvic pain status post vaginal  hysterectomy.   POSTOPERATIVE DIAGNOSES:  Ovarian cyst, pelvic pain status post vaginal  hysterectomy.   OPERATION:  Pelvic exam under anesthesia, exploratory laparotomy, bilateral  salpingo-oophorectomy and appendectomy.   DESCRIPTION OF PROCEDURE:  The patient was placed in lithotomy position and  pelvic exam under anesthesia was accomplished. The left ovary was about 4-5  cm, no masses noted otherwise in the adnexa. The patient was then prepped  and draped, Foley catheter was inserted and the transverse incision was made  in the abdomen. The incision was extended to the fascia which was opened  transversely, peritoneum opened vertically. The upper abdomen appeared to be  normal with no abnormalities noted. The abdominal viscera were then packed  away carefully from the pelvic viscera. The appendix was on top of the right  infundibulopelvic ligament and appeared to be somewhat swollen, was not  erythematous, however. There was a small adhesion into the left tube. The  infundibulopelvic ligament on the left was isolated, skeletonized and  ligated with #0 chromic suture. The left ovary which had a small simple cyst  on it was removed, hemostasis was secure. The right ovary was removed in the  same fashion, skeletonized the infundibulopelvic ligating this and suture  ligating it with #0  chromic and 3-0 Vicryl sutures for hemostasis. The  appendix was in and because it was swollen and laying on top of the  infundibulopelvic  ligament and I freed it up, ligated the mesoappendix and  tied off the base of the appendix with #0 chromic, inverted the stump with 3-  0 Vicryl pursestring suture. Hemostasis was secure. The pelvis was then  washed with copious amounts of saline. The parietoperitoneum was then closed  with 2-0 PDS after sponge and needle count was verified to be correct. The  fascia was then closed with a running suture 2-0 Vicryl and one interrupted  suture of 2-0 Vicryl. The subcutaneum was closed with one 2-0 Vicryl and the  skin was closed with 4-0 PDS. The incision was infiltrated with 0.5%  Marcaine with epinephrine. The patient was awakened and carried to the  recovery room in good condition.  Katherine Roan, M.D.    SDM/MEDQ  D:  06/10/2002  T:  06/10/2002  Job:  [id number]

## 2011-02-19 ENCOUNTER — Ambulatory Visit (HOSPITAL_COMMUNITY)
Admission: RE | Admit: 2011-02-19 | Discharge: 2011-02-19 | Disposition: A | Discharge status: Home / Self Care | Payer: Medicare Other | Source: Ambulatory Visit | Attending: Internal Medicine | Admitting: Internal Medicine

## 2011-02-19 ENCOUNTER — Other Ambulatory Visit (HOSPITAL_COMMUNITY): Payer: Self-pay | Admitting: Internal Medicine

## 2011-02-19 DIAGNOSIS — R0602 Shortness of breath: Secondary | ICD-10-CM | POA: Insufficient documentation

## 2011-02-19 DIAGNOSIS — M7989 Other specified soft tissue disorders: Secondary | ICD-10-CM

## 2011-02-19 DIAGNOSIS — K449 Diaphragmatic hernia without obstruction or gangrene: Secondary | ICD-10-CM | POA: Insufficient documentation

## 2011-04-07 ENCOUNTER — Inpatient Hospital Stay (HOSPITAL_COMMUNITY)
Admission: EM | Admit: 2011-04-07 | Discharge: 2011-04-10 | DRG: 812 | Disposition: A | Discharge status: Home / Self Care | Payer: Medicare Other | Attending: Internal Medicine | Admitting: Internal Medicine

## 2011-04-07 ENCOUNTER — Emergency Department (HOSPITAL_COMMUNITY): Payer: Medicare Other

## 2011-04-07 DIAGNOSIS — E785 Hyperlipidemia, unspecified: Secondary | ICD-10-CM | POA: Diagnosis present

## 2011-04-07 DIAGNOSIS — I10 Essential (primary) hypertension: Secondary | ICD-10-CM | POA: Diagnosis present

## 2011-04-07 DIAGNOSIS — F329 Major depressive disorder, single episode, unspecified: Secondary | ICD-10-CM | POA: Diagnosis present

## 2011-04-07 DIAGNOSIS — R002 Palpitations: Secondary | ICD-10-CM | POA: Diagnosis present

## 2011-04-07 DIAGNOSIS — K449 Diaphragmatic hernia without obstruction or gangrene: Secondary | ICD-10-CM | POA: Diagnosis present

## 2011-04-07 DIAGNOSIS — E669 Obesity, unspecified: Secondary | ICD-10-CM | POA: Diagnosis present

## 2011-04-07 DIAGNOSIS — K219 Gastro-esophageal reflux disease without esophagitis: Secondary | ICD-10-CM | POA: Diagnosis present

## 2011-04-07 DIAGNOSIS — G4733 Obstructive sleep apnea (adult) (pediatric): Secondary | ICD-10-CM | POA: Diagnosis present

## 2011-04-07 DIAGNOSIS — D509 Iron deficiency anemia, unspecified: Principal | ICD-10-CM | POA: Diagnosis present

## 2011-04-07 DIAGNOSIS — K5730 Diverticulosis of large intestine without perforation or abscess without bleeding: Secondary | ICD-10-CM | POA: Diagnosis present

## 2011-04-07 DIAGNOSIS — K222 Esophageal obstruction: Secondary | ICD-10-CM | POA: Diagnosis present

## 2011-04-07 DIAGNOSIS — Z7982 Long term (current) use of aspirin: Secondary | ICD-10-CM

## 2011-04-07 LAB — BASIC METABOLIC PANEL
Calcium: 9.8 mg/dL (ref 8.4–10.5)
GFR calc Af Amer: >60 mL/min (ref >60)
GFR calc non Af Amer: >60 mL/min (ref >60)
Potassium: 4.1 mEq/L (ref 3.5–5.1)
Sodium: 137 mEq/L (ref 135–145)

## 2011-04-07 LAB — T4, FREE: Free T4: 0.86 ng/dL (ref 0.80–1.80)

## 2011-04-07 LAB — DIFFERENTIAL
Basophils Absolute: 0 10*3/uL (ref 0.0–0.1)
Basophils Relative: 0 % (ref 0–1)
Eosinophils Absolute: 0.1 10*3/uL (ref 0.0–0.7)
Eosinophils Relative: 1 % (ref 0–5)
Lymphocytes Relative: 23 % (ref 12–46)

## 2011-04-07 LAB — CBC
HCT: 25.2 % — ABNORMAL LOW (ref 36.0–46.0)
Platelets: 302 10*3/uL (ref 150–400)
RDW: 17.4 % — ABNORMAL HIGH (ref 11.5–15.5)
WBC: 9.7 10*3/uL (ref 4.0–10.5)

## 2011-04-07 LAB — T3, FREE: T3, Free: 2.8 pg/mL (ref 2.3–4.2)

## 2011-04-07 LAB — D-DIMER, QUANTITATIVE: D-Dimer, Quant: 0.46 ug/mL-FEU (ref 0.00–0.48)

## 2011-04-07 LAB — ABO/RH: ABO/RH(D): O POS

## 2011-04-08 LAB — CARDIAC PANEL(CRET KIN+CKTOT+MB+TROPI)
CK, MB: 2.1 ng/mL (ref 0.3–4.0)
CK, MB: 2.1 ng/mL (ref 0.3–4.0)
Relative Index: INVALID (ref 0.0–2.5)
Relative Index: INVALID (ref 0.0–2.5)
Total CK: 65 U/L (ref 7–177)
Total CK: 77 U/L (ref 7–177)
Troponin I: <0.3 ng/mL (ref <0.30)
Troponin I: <0.3 ng/mL (ref <0.30)
Troponin I: <0.3 ng/mL (ref <0.30)

## 2011-04-08 LAB — IRON AND TIBC
Iron: <10 ug/dL — ABNORMAL LOW (ref 42–135)
UIBC: >450 ug/dL — ABNORMAL HIGH (ref 125–400)

## 2011-04-08 LAB — PREPARE RBC (CROSSMATCH)

## 2011-04-08 LAB — FOLATE
Folate: 18.2 ng/mL
Folate: 18.8 ng/mL

## 2011-04-08 LAB — IRON: Iron: 14 ug/dL — ABNORMAL LOW (ref 42–135)

## 2011-04-08 LAB — CBC
Platelets: 234 10*3/uL (ref 150–400)
RBC: 3.19 MIL/uL — ABNORMAL LOW (ref 3.87–5.11)
RDW: 16.9 % — ABNORMAL HIGH (ref 11.5–15.5)
WBC: 7.4 10*3/uL (ref 4.0–10.5)

## 2011-04-08 LAB — FERRITIN: Ferritin: 7 ng/mL — ABNORMAL LOW (ref 10–291)

## 2011-04-08 LAB — BILIRUBIN, TOTAL: Total Bilirubin: 0.5 mg/dL (ref 0.3–1.2)

## 2011-04-08 NOTE — H&P (Signed)
NAME:  Tamara Chambers, Tamara Chambers NO.:  [id number]  MEDICAL RECORD NO.:  [id number]  LOCATION:  [facility]                         FACILITY:  [facility]  PHYSICIAN:  Seward Speck, MD        DATE OF BIRTH:  [birth year]  DATE OF ADMISSION:  04/07/2011 DATE OF DISCHARGE:                             HISTORY & PHYSICAL   CHIEF COMPLAINT:  Palpitations, dyspnea, fatigue.  HISTORY OF PRESENT ILLNESS:  The patient is a 60 year old female, presents for the above complaint.  The patient states for the past 2 months or so, she has been feeling decreased energy, palpitations which she describes as a fast irregular heart rate worse with movements/activity/exertion, intermittent dizziness especially upon standing (no actual syncope), and increased dyspnea with activities. She has never had this constellation of symptoms before.  She has had intermittent chest pain with her dyspnea as well, but no chest pain recently.  Of note, she was here in 2008 for chest discomfort and headaches.  Cardiac catheterization was completely normal, specifically no evidence of coronary artery disease.  She during this time has noted occasional hematochezia, described as a few drops of bright red blood on her stool or on toilet paper after wiping associated with the need to strain due to constipation and some tearing sensation inside her distal rectum with having a bowel movement.  Her stools have otherwise been normal, specifically no thin stools or other change in bowel habits.  She has not had any other signs of bleeding such as melena, hematemesis, hemoptysis, or vaginal bleeding.  She presents to the emergency room due to the symptoms, tonight was found to have a new anemia with hemoglobin near 7 and has been admitted for further care.  PAST MEDICAL HISTORY: 1. Hypertension. 2. Major depressive disorder. 3. Hyperlipidemia. 4. History of OSA per her, but she is not currently on CPAP, but treats     this by  sleeping upright. 5. History of TAH/bilateral salpingo-oophorectomy. 6. History of appendectomy. 7. History of diverticulosis in 1990s with last bleeding episode in     2004. 8. History of cardiac cath in 2008, it was negative for any     significant coronary artery disease. 9. History of PUD in 1990s. 10.History of colonoscopy in the past with her last being 2004 due to     her diverticulosis.  ALLERGIES:  BUPROPION, CODEINE, DEPAKOTE, GABAPENTIN.  CURRENT MEDICATIONS: 1. Ventolin as needed. 2. Aspirin 81 mg daily. 3. Zoloft 100 mg daily. 4. Xanax 0.5 mg 3 times daily. 5. Cymbalta 30 mg twice daily. 6. Tramadol 50 mg 2 tablets every 6 hours. 7. Lipitor 20 mg daily. 8. Furosemide 20 mg as needed. 9. Lisinopril 10 mg once daily.  Denies any NSAID use other than her daily aspirin.  FAMILY HISTORY:  Significant for heart disease and has a history of some sort of heart arrhythmia requiring ablation, details unknown.  SOCIAL HISTORY:  The patient denies any smoking or tobacco use.  Lives alone.  REVIEW OF SYSTEMS:  12-point review of systems was reviewed with the patient as in the HPI, otherwise negative.  Code status: Full  PHYSICAL EXAMINATION:  VITAL SIGNS:  Afebrile.  Last  blood pressure was 106/47, pulse has been one teens, respiratory rate 18, oxygen saturation 100% on room air. GENERAL/CONSTITUTIONAL:  Obese female in no acute distress.  She is tearful during our discussion due to some issues she is having with her daughter. HEENT:  Moist mucous membranes. She has some sublingual icterus.  Her pupils, conjunctivae are bit pale as well. NECK:  Supple without JVD.  Carotid upstroke is normal. CARDIOVASCULAR:  Tachycardic, but regular.  She has 2/6 systolic flow sounding murmur heard best at the and right left upper sternal border. There is no radiation. PULMONARY:  Clear to auscultation bilaterally. ABDOMEN:  Soft and nontender.  No masses. EXTREMITIES:  Warm and  well perfused.  No cyanosis, clubbing, or edema x4.  Normal pulses throughout. SKIN:  No rash. PSYCHIATRIC:  Appropriate.  NEUROLOGIC:  Alert or oriented x3. PSYCH:  Cheerful at times due to issues she is having with her daughter. RECTAL:  The emergency room physician's rectal exam and testing her Hemoccult blood was negative, otherwise normal per report.  DIAGNOSTIC STUDIES:  CBC today showed hemoglobin 7.6, MCV and MCH are low, RDW high.  Last hemoglobin in our system was 11.7 in May 2011, prior to that was 14.6 in June 2008.  BMP was normal.  Thyroid studies normal.  Chest x-ray showed a hiatal hernia, otherwise negative.  EKG sinus tachycardia, nonspecific T-wave changes, otherwise unremarkable.  ASSESSMENT AND PLAN: 1. Anemia, symptomatic with palpitations, dyspnea, and fatigue.  Her     pattern suggest iron-deficiency anemia.  There is no obvious source     although specifically GI source be the most likely cause.  She does     have history of diverticulosis and PUD, both of which have not     produced overt symptoms of late.  She also has a history consistent     with anal fissures, possibly hemorrhoids, although her degree of     anemia seems out of proportion to the degree of blood loss she is     describing due to the latter 2 problems.  In any case, we will     continue to complete the workup with an iron panel including     ferritin, B12, folic acid level, and hemolysis labs.  We will give     her 2 units of blood to symptomatically improve her.  She will need     upper and lower endoscopies, which may be able to be done as an     outpatient, obviously it will not be done  tomorrow, so we will not     keep her n.p.o.  we will use ambulation for DVT     prophylaxis given the possibility of a bleeding     source.  In addition, we will hold her daily aspirin as there is no     obvious hard indication for at this time. 2. History of depression.  We will continue her meds  while here. 3. History of hypertension.  We will continue her meds while here. 4. History of hyperlipidemia.  We will continue Lipitor while here. 5. Palpitations.  This is likely the sinus tachycardia that she has     been displaying here.  As a result of her anemia, we will not work     this up from a primary cardiac standpoint any further at this time,     but rather treat her underlying anemia.     She had a heart cath in the past,  which was negative and     she has also had an in 2008 that was     relatively unremarkable, I doubt we need to work up for primary     heart  etiology when we have anemia as the likely cause, obviously     should her symptoms change, we will then pursue further cardiac     workup. 6. Dyspnea on exertion, again this is likely due to anemia.  We will     not work this up any further at the current time.  I considered but     rejected the diagnosis of a PE based on lack of hypoxia and     alternative explanation of anemia and the danger in anticoagulating     her should she even have a PE to begin with, but again I do not     feel this is required for further workup at this time as there is     documented hypoxia. 7. Fatigue.  This is likely due to her anemia as stated as above. 8. Prophylaxis.  We will use ambulation due to the reasons stated     above.  She is full code per her request.          ______________________________ Seward Speck, MD     DP/MEDQ  D:  04/07/2011  T:  04/08/2011  Job:  [id number]  Electronically Signed by Seward Speck MD on 04/08/2011 03:34:39 AM

## 2011-04-09 LAB — CBC
MCH: 24.2 pg — ABNORMAL LOW (ref 26.0–34.0)
MCV: 79 fL (ref 78.0–100.0)
Platelets: 221 10*3/uL (ref 150–400)
RDW: 17.2 % — ABNORMAL HIGH (ref 11.5–15.5)
WBC: 7.7 10*3/uL (ref 4.0–10.5)

## 2011-04-09 LAB — DIFFERENTIAL
Basophils Relative: 1 % (ref 0–1)
Eosinophils Absolute: 0.3 10*3/uL (ref 0.0–0.7)
Eosinophils Relative: 3 % (ref 0–5)
Lymphs Abs: 2.6 10*3/uL (ref 0.7–4.0)
Neutrophils Relative %: 55 % (ref 43–77)

## 2011-04-09 LAB — OCCULT BLOOD X 1 CARD TO LAB, STOOL
Fecal Occult Bld: POSITIVE
Fecal Occult Bld: POSITIVE
Fecal Occult Bld: POSITIVE

## 2011-04-09 LAB — BASIC METABOLIC PANEL
Calcium: 8.8 mg/dL (ref 8.4–10.5)
Chloride: 107 mEq/L (ref 96–112)
Creatinine, Ser: 0.81 mg/dL (ref 0.50–1.10)
GFR calc Af Amer: >60 mL/min (ref >60)
Sodium: 140 mEq/L (ref 135–145)

## 2011-04-10 ENCOUNTER — Other Ambulatory Visit: Payer: Self-pay | Admitting: Gastroenterology

## 2011-04-10 ENCOUNTER — Inpatient Hospital Stay (HOSPITAL_COMMUNITY): Payer: Medicare Other

## 2011-04-10 DIAGNOSIS — K449 Diaphragmatic hernia without obstruction or gangrene: Secondary | ICD-10-CM

## 2011-04-10 LAB — CBC
MCH: 24.6 pg — ABNORMAL LOW (ref 26.0–34.0)
MCHC: 30.9 g/dL (ref 30.0–36.0)
Platelets: 215 10*3/uL (ref 150–400)

## 2011-04-10 LAB — CROSSMATCH
ABO/RH(D): O POS
Antibody Screen: NEGATIVE

## 2011-04-10 LAB — HAPTOGLOBIN: Haptoglobin: 209 mg/dL — ABNORMAL HIGH (ref 30–200)

## 2011-04-12 NOTE — Op Note (Signed)
NAME:  Tamara Chambers, Tamara Chambers              ACCOUNT NO.:  [id number]  MEDICAL RECORD NO.:  [id number]  LOCATION:  3731                         FACILITY:  [facility]  PHYSICIAN:  Bernette Redbird, M.D.   DATE OF BIRTH:  Jan 10, 1951  DATE OF PROCEDURE:  04/10/2011 DATE OF DISCHARGE:                              OPERATIVE REPORT   PROCEDURE:  Upper endoscopy with Savary dilatation of the esophagus under fluoroscopy.  INDICATIONS:  A 60 year old female with longstanding dysphagia symptoms and previous radiographic evaluation showing hang-up of the barium tablet at the level of the Schatzki ring, in association with a large hiatal hernia.  FINDINGS:  Large hiatal hernia, patulous diaphragmatic hiatus, Savary dilatation to 15 mm.  PROCEDURE:  The risks of the procedure had been reviewed with the patient who provided written consent, having been brought from her hospital room to the endoscopy unit.  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  She also has a history of sleep apnea but there was no evidence of that during this procedure, and specifically no evidence of desaturation.  The Pentax adult video endoscope was passed under direct vision.  The vocal cords looked entirely normal.  The esophagus was entered and its mucosa was entirely normal without evidence of reflux esophagitis, Barrett esophagus, nor any varices, infection, or neoplasia.  At the squamocolumnar junction, there was slight resistance to passage of the 10 mm endoscope, which "popped" through that area with gentle resistance, consistent with fracturing the ring, and in fact there was a little bit of bleeding.  A large hiatal hernia was present, with a cephalocaudal dimension of probably about 5 or 6 cm, perhaps more, but with  most of the hiatal hernia in a transverse orientation.  It is estimated that perhaps a quarter of the patient's stomach was in the chest.  The abdominal portion of the stomach was entered.  It had normal mucosa without evidence of gastritis, erosions, ulcers, polyps or masses and a retroflexed view of the proximal stomach showed no evidence of Cameron erosions at the diaphragmatic hiatus, or any other abnormalities in the proximal stomach.  The diaphragmatic hiatus was so patulous that it was almost not recognizable except with forced respiratory maneuvers by the patient at our request.  The pylorus, duodenal bulb and second duodenum looked normal.  Based on the patient's history of recurrent dysphagia symptoms, it was elected to proceed with Savary dilatation of the esophagus.  The spring tip guidewire was passed through the scope into the antral region of the stomach, the scope was removed in an exchanged fashion, and sequential passage was made using Savary dilators, sizes 12.8 and 15 mm, encountering fairly smooth resistance.  There was no overt blood on the dilators.  The patient was free endoscoped after the second dilator which showed a moderate amount of fresh red blood but no evidence of active bleeding, some evidence of mucosal disruption but no deep tears or evidence of perforation.  It was felt that sufficient dilatation for this time had  been accomplished, considering the small diameter that we had started from, so I elected not to go up to a larger size dilators. The scope was therefore removed from the patient.  She tolerated the procedure well and there were no apparent complications.  IMPRESSION: 1. Esophageal ring dilated to 15 mm by Savary technique, starting from     a rather tight initial diameter of less than 10 mm as evidenced by     fracture from passage of the scope at the start of the procedure. 2. Large hiatal hernia.  This could conceivably account for  the     patient's severe iron-deficiency anemia, hemoglobin in the 7 range,     with which she presented, although not necessarily would account     for the heme-positive stool that was noted.  PLAN: 1. A clinical followup of dysphagia symptoms. 2. Consider surgical repair of the hiatal hernia, particularly if no     alternative source for iron-deficiency anemia seen on colonoscopy.     We will proceed to colonoscopy at this time.          ______________________________ Bernette Redbird, M.D.     RB/MEDQ  D:  04/10/2011  T:  04/10/2011  Job:  [id number]  cc:   Fayrene Fearing L. Malon Kindle., M.D.  Electronically Signed by Bernette Redbird M.D. on 04/12/2011 11:16:54 AM

## 2011-04-12 NOTE — Op Note (Signed)
  NAMEMarland Kitchen  MESHELLE, Tamara Chambers NO.:  [id number]  MEDICAL RECORD NO.:  [id number]  LOCATION:  3731                         FACILITY:  [facility]  PHYSICIAN:  Bernette Redbird, M.D.   DATE OF BIRTH:  Jan 08, 1951  DATE OF PROCEDURE:  04/10/2011 DATE OF DISCHARGE:                              OPERATIVE REPORT   SURGEON:  Bernette Redbird, MD  PROCEDURE:  Colonoscopy with biopsy.  INDICATIONS:  A 60 year old female with heme-positive stool and iron deficiency anemia, last colonoscoped in 2004.  FINDINGS:  Diminutive rectosigmoid polyp.  Pancolonic diverticulosis. No source of iron deficiency anemia evident.  PROCEDURE:  The risks of the procedure had been reviewed with the patient who provided written consent.  This procedure was performed immediately following her upper endoscopy.  Total sedation for the two procedures was fentanyl 37.5 mcg and Versed 9 mg IV without arrhythmias desaturation or clinical instability.  The Pentax adult videocolonoscope was readily advanced around the colon, using some external abdominal compression to control looping.  The cecum was identified by visualization of the appendiceal orifice, and I also entered the terminal ileum for a short distance, which appeared normal.  Pullback was then performed in a gradual fashion.  The quality of the prep was very good except for a little bit of retained vegetable debris in the cecum itself, although it is not felt that would have obscured any significant lesions.  The patient had a 3-mm sessile polyp near the rectosigmoid junction removed by cold biopsy and she had scattered diverticular disease, mild- to-moderate in amount, throughout most of the colon.  This was otherwise a normal exam.  No colitis, large polyps, masses, or vascular ectasia were noted and retroflexion of the rectum was unremarkable.  The patient tolerated the procedure well and there were no apparent complications.  IMPRESSION: 1.  No source of iron deficiency anemia or heme-positive stool,     colonoscopically evident during current examination, suggesting     that the patient's known large hiatal hernia is the likely     explanation for the anemia. 2. Diminutive polyp, pathology pending. 3. Extensive diverticulosis.  PLAN:  Await polyp pathology and consider surgical consultation for possible hiatal hernia repair.          ______________________________ Bernette Redbird, M.D.     RB/MEDQ  D:  04/10/2011  T:  04/10/2011  Job:  [id number]  cc:   Fayrene Fearing L. Malon Kindle., M.D.  Electronically Signed by Bernette Redbird M.D. on 04/12/2011 11:16:56 AM

## 2011-04-14 NOTE — Discharge Summary (Signed)
Tamara Chambers, Tamara Chambers              ACCOUNT NO.:  [id number]  MEDICAL RECORD NO.:  [id number]  LOCATION:  3731                         FACILITY:  [facility]  PHYSICIAN:  Kela Millin, M.D.DATE OF BIRTH:  06/07/1951  DATE OF ADMISSION:  04/07/2011 DATE OF DISCHARGE:  04/10/2011                        DISCHARGE SUMMARY - REFERRING   DISCHARGE DIAGNOSES: 1. Severe iron-deficiency anemia/GI bleed -- per GI likely secondary     to large hiatal hernia. 2. Dizziness/palpitations -- likely secondary to #1, ruled out for     myocardial infarction by enzymes. 3. Large hiatal hernia -- elective surgical repair outpatient per Dr.     Johna Sheriff, follow-up appointment on April 27, 2011. 4. Esophageal ring -- status post dilatation. 5. Gastroesophageal reflux disease. 6. Chest pain -- ruled out for myocardial infarction by enzymes,     likely secondary to gastroesophageal reflux disease. 7. Hypertension. 8. History of major depressive disorder. 9. History of diverticulosis. 10.History of obstructive sleep apnea. 11.Status post cardiac catheterization in 2008 which was negative for     significant coronary artery disease. 12.History of peptic ulcer disease in 90s.  CONSULTATIONS: 1. Gastroenterology -- Dr. Hayes/Dr. Buccini. 2. General Surgery -- Dr. Johna Sheriff.  PROCEDURES AND STUDIES: 1. Chest x-ray on 09/08 -- hiatal hernia.  No infiltrate, congestive     heart failure, and pneumothorax. 2. Colonoscopy with biopsy on 09/11 per Dr. Matthias Hughs -- no source of     iron-deficiency anemia or heme-positive stool, colonoscopically     evident during the current exam, suggesting that the patient's     known large hiatal hernia is the likely explanation for the anemia.     Diminutive polyp, pathology pending.  Extensive diverticulosis. 3. EGD with Savary dilation of the esophagus under fluoroscopy --     esophageal ring to 15 mm by Savary technique, starting from a     rather tight initial  diameter of less than 10 mm as evidenced by     fracture from passage of the scope at the start of the procedure.     Large hiatal hernia -- this could conceivably account for the     patient's severe iron-deficiency anemia with hemoglobin in the 7     range with which she presented also, not necessarily we would     account for the heme positive stool that was noted.  Surgical     repair of hiatal hernia, particularly if no alternative source for     iron-deficiency anemia recommended per GI.  BRIEF HISTORY:  The patient is a pleasant 60 year old white female with above-listed medical problems, who presented with complaints of palpitations, dyspnea, and fatigue.  She reported that these symptoms had been going on for about 2 months or so and she had decreased energy levels with palpitations and intermittent dizziness, especially with standing.  She also reported increased dyspnea with activities, and admitted to intermittent chest pain but stated that she had not had the chest pain recently.  It was noted that she had, had a cardiac cath in 2008 which did not show any significant coronary artery disease.  She also admitted to occasional hematochezia -- with bright red blood on her stool or on  the toilet paper after wiping herself.  In the ED, her hemoglobin was low at 7.6, and she was admitted for further evaluation and management.  HOSPITAL COURSE: 1. Severe iron-deficiency anemia/GI bleed -- upon admission, the     patient had an anemia panel done which came back with an iron level     of less than 10 and TIBC greater than 450.  Her vitamin B12 was     461, and her ferritin was 7.  Her folate was 18.2.  Given that she     was symptomatic with this anemia, she was transfused 2 units of     packed red blood cells following admission, and her hemoglobin came     up to 8.1.  On followup, her hemoglobin again dropped to 7.5, and     she was transfused another unit of packed red blood  cells and on     recheck today, the hemoglobin is 8.6.  She has not had any further     dizziness, and she is more energetic and the palpitations had     decreased.  Gastroenterology was consulted following her admission,     and Dr. Madilyn Fireman saw the patient initially and recommended endoscopy,     and Dr. Matthias Hughs followed up with the patient today and colonoscopy     was done, which showed a diminutive polyp with extensive     diverticulosis but no source of the iron-deficiency anemia or heme-     positive stool was noted.  The patient also had an EGD which showed     a large hiatal hernia, and Dr. Buccini's impression was that it was     most likely the source of her iron-deficiency anemia.  He     recommended a General Surgery consult and I agreed and consulted     General Surgery, and the patient was seen by Dr. Johna Sheriff and     recommends outpatient followup for elective surgical repair.  He     also wants the patient to have a manometry preoperatively.  She is     to follow up outpatient with GI and Surgery for this to be done.     She is tolerating her regular diet at this time, and she will be     discharged home for outpatient followup.  The patient has been     discharged on iron supplementation and is to follow up outpatient. 2. Chest discomfort/dizziness -- with palpitations as discussed above,     the patient had cardiac enzymes cycled on admission and this came     back negative.  It was noted that she had, had a cardiac cath in     2008 by Dr. Jacinto Halim and it came back with no significant coronary     artery disease.  The impression was that these symptoms were     secondary to #1, and she is much clinically improved status post     transfusions and treatment with a PPI. 3. Large hiatal hernia -- as above noted per the EGD and General     Surgery consulted and Dr. Johna Sheriff saw the patient and recommended     outpatient surgery, and she is to have manometry preoperatively. 4.  Gastroesophageal reflux disease -- the patient was placed on PPI     during this hospital stay with resolution of her chest discomfort,     and she is to continue this upon discharge and follow up  outpatient.  She is also to follow up for elective surgical repair     of her hernia as above. 5. Esophageal ring -- status post dilatation as above. 6. Hypertension -- the patient was maintained on her outpatient     medications and is to continue them upon discharge. 7. History of dyslipidemia -- she is to continue her Lipitor upon     discharge. 8. Depression -- the patient was maintained on her outpatient     medications during this hospital stay.  DISCHARGE MEDICATIONS: 1. Iron sulfate 325 mg one p.o. b.i.d. 2. Prilosec 40 mg one p.o. b.i.d. 3. Senokot 1-2 tablets p.o. at bedtime, hold for diarrhea. 4. Cymbalta 30 mg p.o. b.i.d. 5. Lipitor 20 mg p.o. at bedtime. 6. Lisinopril 10 mg p.o. daily. 7. Tramadol 50 mg 2 tablets q.6 h. p.r.n. 8. Albuterol MDI 2 puffs t.i.d. p.r.n. 9. Xanax 0.5 mg t.i.d. 10.Zoloft 100 mg one p.o. q.a.m.  DISCONTINUED MEDICATIONS:  Lasix and aspirin, the patient to stay off aspirin until follow-up with GI/PCP.  FOLLOW-UP CARE: 1. Primary care physician in 1-2 weeks. 2. Dr. Johna Sheriff with [facility] on 09/28 at 02:30 p.m. 3. Cardiology -- Dr. Jacinto Halim as needed. 4. Gastroenterology - Dr. Edwards/Dr. Buccini in 1-2 weeks, call for     appointment.  DISCHARGE CONDITION:  Improved/stable.     Kela Millin, M.D.     ACV/MEDQ  D:  04/10/2011  T:  04/10/2011  Job:  [id number]  Electronically Signed by Donnalee Curry M.D. on 04/14/2011 05:46:06 PM

## 2011-04-22 NOTE — Consult Note (Signed)
  NAME:  Tamara Chambers, PITZ              ACCOUNT NO.:  [id number]  MEDICAL RECORD NO.:  [id number]  LOCATION:                                 FACILITY:  PHYSICIAN:  Pami Wool C. Madilyn Fireman, M.D.    DATE OF BIRTH:  03-Dec-1950  DATE OF CONSULTATION:  04/08/2011 DATE OF DISCHARGE:                                CONSULTATION   REASON FOR CONSULTATION:  Anemia and dysphagia with a known hiatal hernia.  HISTORY OF PRESENT ILLNESS:  The patient is a 60 year old white female who presented with dyspnea, palpitations, mild chest pain, and peripheral edema.  She was found to have a hemoglobin of 7.  She describes occasional bright red blood per rectum, but no other specific GI symptoms and does not use nonsteroidal anti-inflammatory drugs.  She reportedly had a history of peptic ulcer disease in 1990s.  She has had colonoscopies by Dr. Randa Evens, but the last one in 2004 showing diverticulosis.  She has a known large hiatal hernia by barium swallow last year.  This also showed a stricture without passage of the tablet. The patient does complain of intermittent solid food dysphagia with some forced regurgitation and has never had an EGD.  PAST MEDICAL HISTORY:  Hypertension, depression, hyperlipidemia, diverticulosis, and reported peptic ulcer disease.  PAST SURGICAL HISTORY:  TAH-BSO, appendectomy, and cardiac cath.  MEDICATIONS:  Ventolin, lisinopril, aspirin, furosemide, Zoloft, Lipitor, Xanax, tramadol, and Cymbalta.  ALLERGIES:  GABAPENTIN, DEPAKOTE, CODEINE, and BUPROPION.  FAMILY HISTORY:  Positive for heart disease.  SOCIAL HISTORY:  Denies alcohol or tobacco use.  PHYSICAL EXAMINATION:  GENERAL:  Moderately obese white female in no acute stress. HEART:  Regular rate and rhythm with a 2/6 systolic murmur. ABDOMEN:  Soft and nondistended with normoactive bowel sounds.  No hepatosplenomegaly, mass, or guarding.  LABORATORY DATA:  Hemoglobin 7.6, MCV 76, and platelets 302,000.   Liver function tests normal.  Iron 14.  IMPRESSION:  Iron-deficiency anemia, occasional rectal bleeding, and solid food dysphagia in a patient with known diverticulosis, large hiatal hernia with a radiologically diagnosed stricture and reported history of peptic ulcer disease in the remote past.  PLAN:  I will plan on updating both her endoscopy and colonoscopy while here and perform dilatation if feasible and appropriate, we will plan on prepping the day after tomorrow for procedures.          ______________________________ [name], M.D.     [facility]  D:  04/08/2011  T:  04/08/2011  Job:  [id number]  Electronically Signed by Dorena Cookey M.D. on 04/22/2011 11:42:16 AM

## 2011-04-27 ENCOUNTER — Encounter (INDEPENDENT_AMBULATORY_CARE_PROVIDER_SITE_OTHER): Payer: Self-pay | Admitting: General Surgery

## 2011-04-27 ENCOUNTER — Ambulatory Visit (INDEPENDENT_AMBULATORY_CARE_PROVIDER_SITE_OTHER): Payer: Medicare Other | Admitting: General Surgery

## 2011-04-27 VITALS — BP 132/90 | HR 80 | Temp 98.2°F | Resp 20 | Ht 67.0 in | Wt 229.4 lb

## 2011-04-27 DIAGNOSIS — K219 Gastro-esophageal reflux disease without esophagitis: Secondary | ICD-10-CM

## 2011-04-27 NOTE — Progress Notes (Signed)
Subjective:   Reflux and anemia and hiatal hernia  Patient ID: Tamara Chambers, female   DOB: 03-14-1951, 60 y.o.   MRN: [id number] Patient is a 60 year old female initially seen in consultation when she was hospitalized a [facility] on September 11 of this year. HPI At that time she was hospitalized with fatigue and dyspnea and found to have a significant iron deficiency anemia with a hemoglobin of about 7. She was transfused and workup instituted. She has a many year history of chronic reflux and heartburn symptoms. She has a known hiatal hernia. She has had dysphagia and esophageal stricture dilated in the past. The patient has continued to have water brash at night and wakes up with fluid in her throat if she does not sleep with her head elevated.  While in the hospital she underwent a colonoscopy which showed only a couple of small rectosigmoid benign polyps. However upper endoscopy showed a large hiatal hernia with a distal esophageal stricture that was dilated. She did not have any erosions or ulcers in her stomach. However these findings were the most significant and felt to be likely a cause of her anemia. She had a upper GI series and CT scan in 2011 showing a large hiatal hernia with approximately half the stomach in the chest and suspected stricture at that time as well appeared  Currently since her recent dilatation her dysphagia is resolved. She had heartburn years ago but not so much recently but continues with significant regurgitation water brash.  Past Medical History  Diagnosis Date  . Anemia   . Anxiety   . Arthritis   . Diabetes mellitus   . Emphysema of lung   . GERD (gastroesophageal reflux disease)   . Hyperlipidemia   . Hypertension   . Neuromuscular disorder   . Seizures   . Thyroid disease    Past Surgical History  Procedure Date  . Tonsilectomy, adenoidectomy, bilateral myringotomy and tubes 1968  . Cesarean section 1984  . Abdominal hysterectomy  1992    partial  . Oophorectomy 2004  . Femur distal locking screw insertion 2004    left foot   Current Outpatient Prescriptions  Medication Sig Dispense Refill  . albuterol (PROVENTIL,VENTOLIN) 90 MCG/ACT inhaler Inhale 2 puffs into the lungs as needed.        . ALPRAZolam (XANAX) 0.5 MG tablet Take 0.5 mg by mouth 3 (three) times daily.        Marland Kitchen atorvastatin (LIPITOR) 20 MG tablet Take 20 mg by mouth at bedtime.        . DULoxetine (CYMBALTA) 30 MG capsule Take 30 mg by mouth 2 (two) times daily.        . ferrous sulfate 325 (65 FE) MG tablet Take 325 mg by mouth 2 (two) times daily.        . furosemide (LASIX) 20 MG tablet Take 20 mg by mouth as needed.        Marland Kitchen lisinopril (PRINIVIL,ZESTRIL) 10 MG tablet Take 10 mg by mouth daily.        Marland Kitchen omeprazole (PRILOSEC) 40 MG capsule Take 40 mg by mouth 2 (two) times daily.        Marland Kitchen senna (SENOKOT) 8.6 MG tablet Take 1 tablet by mouth daily.        . sertraline (ZOLOFT) 100 MG tablet Take 100 mg by mouth daily.        . traMADol (ULTRAM) 50 MG tablet Take 50 mg by mouth every 6 (  six) hours as needed.         Allergies  Allergen Reactions  . Depakote Anaphylaxis  . Codeine Other (See Comments)    Patient cannot remember reaction at this time.  . Neurontin (Gabapentin) Other (See Comments)    Cannot remember reaction.  . Wellbutrin (Bupropion Hcl) Other (See Comments)    seizure   History  Substance Use Topics  . Smoking status: Former Smoker    Quit date: 04/26/2004  . Smokeless tobacco: Never Used  . Alcohol Use: No      Review of Systems  Constitutional: Positive for fatigue.  HENT: Negative.   Respiratory: Negative.   Cardiovascular: Positive for leg swelling.  Gastrointestinal: Positive for blood in stool and anal bleeding. Negative for abdominal pain.  Genitourinary: Negative.   Musculoskeletal: Positive for arthralgias.       Objective:   Physical Exam General: Moderately obese Caucasian female in no  distress HEENT: No palpable masses or thyromegaly. Sclera nonicteric. Oropharynx clear. Lymph nodes: Cervical or supraclavicular or inguinal nodes palpable Lungs: Clear without wheezing or increased work of breathing Cardiovascular: Regular rate and rhythm without murmur. No edema. Abdomen: Well-healed Pfannenstiel incision. Soft and nontender without masses or organomegaly identified Extremities: No joint swelling deformity or significant edema Neurologic: Alert and fully oriented gait normal    Assessment:     Large hiatal hernia with long-standing reflux and regurgitation, esophageal stricture and possible bleeding from [facility] ulcers although this was not clearly documented. I believe she has indications for hiatal hernia repair with fundoplication. We discussed the indications for the procedure, its nature, and risks of anesthetic complications, bleeding, infection, recurrent hernia and reflux, dysphagia, gas bloat. She was given complete literature regarding the procedure. She would like to proceed.    Plan:     Laparoscopic repair of her large hiatal hernia with Nissen fundoplication.

## 2011-04-27 NOTE — Patient Instructions (Signed)
Call for any questions prior to surgery

## 2011-04-30 NOTE — Consult Note (Signed)
NAMEMarland Kitchen  AAYRA, HORNBAKER              ACCOUNT NO.:  [id number]  MEDICAL RECORD NO.:  [id number]  LOCATION:  3731                         FACILITY:  [facility]  PHYSICIAN:  Lorne Skeens. Talayla Doyel, M.D.DATE OF BIRTH:  [birth year]  DATE OF CONSULTATION:  04/10/2011 DATE OF DISCHARGE:  04/10/2011                                CONSULTATION   REQUESTING PHYSICIAN:  Kela Millin, MD  REASON FOR CONSULTATION:  Large hiatal hernia.  HISTORY OF PRESENT ILLNESS:  Tamara Chambers is a 60 year old who presented with palpitations, dyspnea, and fatigue and was found to have significant anemia and has been worked up and found to have iron- deficiency anemia.  Of note, she has had a history of diverticulosis with bleeding episode several years ago.  She has also had a history of reflux disease with a known history of hiatal hernia and esophageal stricture.  She has multiple other medical disorders that will be discussed in our past medical history.  She has been worked up this admission for the anemia and again found to have significant iron deficiency.  She had a total transfusion of 3 units of packed red blood cells and her hemoglobin is currently 8.6.  Symptomatically, the patient is feeling significantly better.  Gastroenterology was consulted and performed upper and lower endoscopies today.  The lower endoscopy showed evidence of small rectosigmoid polyps without evidence of active bleeding.  This was snared for biopsy and also shows pancolonic diverticulum, but no evidence of active bleed.  The upper endoscopy showed significant distal esophageal stricture that was subsequently dilated.  Once into the stomach, there was evidence of large hiatal hernia.  No evidence of varices or AVMs were identified.  No evidence of gastritis, erosions, ulcers, polyps, or masses were found in the stomach.  No evidence of Cameron lesions in the diaphragmatic hiatus were identified.  No peptic ulcer were seen.  The  patient was sent to the floor in a stable condition post dilatation and actually has been tolerating regular diet since that time.  It has been recommended though that a surgical consult is warranted for hiatal hernia as the possible source of her chronic iron-deficiency anemia.  PAST MEDICAL HISTORY:  Significant for, 1. Hypertension. 2. Major depressive disorder. 3. Hyperlipidemia. 4. History of obstructive sleep apnea. 5. History of diverticulosis. 6. Peptic ulcer disease.  PAST SURGICAL HISTORY:  The patient has had a C-section.  She also had a total abdominal hysterectomy and BSO with an incidental appendectomy at the same time.  She has had a cardiac cath in 2008, which was negative. She has had upper and lower endoscopies as noted.  ALLERGIES:  BUPROPION, CODEINE, DEPAKOTE, and GABAPENTIN.  CURRENT MEDICATIONS:  Ventolin, aspirin, Zoloft, Xanax, Cymbalta, tramadol, Lipitor, Lasix, and lisinopril.  FAMILY HISTORY:  Noncontributory to the present case.  SOCIAL HISTORY:  The patient denies alcohol, tobacco abuse.  She lives alone, but her mother was present here at the time of the visit.  REVIEW OF SYSTEMS:  Please see history of present illness, otherwise complete 12-system review found negative.  PHYSICAL EXAMINATION:  GENERAL:  A 60 year old female who is in no acute distress. CURRENT VITAL SIGNS:  Temperature of 97.7, heart  rate of 78, respiratory rate of 22, blood pressure 123/73, oxygen saturation 96% on room air. ENT:  Unremarkable. NECK:  Supple without lymphadenopathy. LUNGS:  Clear to auscultation.  No wheezes, rhonchi, or rales.  Normal respiratory effort without use of accessory muscles. HEART:  Regular rate and rhythm.  No murmurs, gallops, or rubs. Carotids are 2+ and brisk without bruits.  Peripheral pulses intact and symmetrical. ABDOMEN:  Soft.  Surgical scars noted.  She is nontender, nondistended. Bowel sounds are active. EXTREMITIES:  Without acute  deficit. SKIN:  Otherwise warm and dry with good turgor.  No rashes, lesions, or nodules. NEUROLOGICALLY:  The patient is alert and oriented.  LABORATORY DATA:  CBC shows a white blood cell count of 8.6, hemoglobin of 8.6, hematocrit of 27.8, platelet count of 215.  Metabolic panel shows a sodium of 140, potassium of 4.5, chloride of 107, CO2 of 24, BUN of 15, creatinine of 0.8, glucose of 106.  Iron level very low at 14.  B12 within normal range.  IMAGING:  No imaging has been performed this admission aside from endoscopic imaging  Dating back to last year 2011 both by upper GI and CT scan shows findings of a large hiatal hernia with a stricture at the EG junction.  IMPRESSION: 1. Large hiatal hernia - no active bleeding source identified within     the hernia, but suspected for chronic iron-deficiency anemia. 2. Distal esophageal stricture status post dilatation. 3. Iron-deficiency anemia.  RECOMMENDATIONS:  Recommend starting iron supplementation.  The patient is currently feeling significantly better and is not symptomatic from her hiatal hernia.  She is tolerating regular diet without any pain, nausea, or vomiting.  She could be discharged from our standpoint and I will be happy to see her in our office for continued discussion for possible surgical intervention.  Certainly, the patient could benefit from the procedure as she has had stricture likely from reflux that has become a chronic problem as well as ongoing iron-deficiency anemia.  The patient has been scheduled with Dr. Johna Sheriff for a followup appointment in the coming weeks.  Please contact our office or call back with any questions or concerns.     Brayton El, PA-C   ______________________________ Lorne Skeens. Tykeisha Peer, M.D.    KB/MEDQ  D:  04/10/2011  T:  04/11/2011  Job:  [id number]  Electronically Signed by Brayton El  on 04/18/2011 03:22:17 PM Electronically Signed by Glenna Fellows M.D. on  04/30/2011 01:18:06 PM

## 2011-05-09 ENCOUNTER — Encounter (HOSPITAL_COMMUNITY): Payer: Medicare Other

## 2011-05-09 ENCOUNTER — Other Ambulatory Visit (INDEPENDENT_AMBULATORY_CARE_PROVIDER_SITE_OTHER): Payer: Self-pay | Admitting: General Surgery

## 2011-05-09 LAB — SURGICAL PCR SCREEN
MRSA, PCR: POSITIVE — AB
Staphylococcus aureus: POSITIVE — AB

## 2011-05-09 LAB — BASIC METABOLIC PANEL
CO2: 30 mEq/L (ref 19–32)
Calcium: 9.7 mg/dL (ref 8.4–10.5)
Creatinine, Ser: 0.98 mg/dL (ref 0.50–1.10)
GFR calc non Af Amer: 63 mL/min — ABNORMAL LOW (ref >90)
Sodium: 140 mEq/L (ref 135–145)

## 2011-05-09 LAB — CBC
MCH: 26.2 pg (ref 26.0–34.0)
MCHC: 30.7 g/dL (ref 30.0–36.0)
MCV: 85.4 fL (ref 78.0–100.0)
Platelets: 208 10*3/uL (ref 150–400)
RBC: 4.46 MIL/uL (ref 3.87–5.11)

## 2011-05-15 ENCOUNTER — Inpatient Hospital Stay (HOSPITAL_COMMUNITY)
Admission: RE | Admit: 2011-05-15 | Discharge: 2011-05-18 | DRG: 328 | Disposition: A | Discharge status: Home / Self Care | Payer: Medicare Other | Source: Ambulatory Visit | Attending: General Surgery | Admitting: General Surgery

## 2011-05-15 DIAGNOSIS — D509 Iron deficiency anemia, unspecified: Secondary | ICD-10-CM | POA: Diagnosis present

## 2011-05-15 DIAGNOSIS — IMO0001 Reserved for inherently not codable concepts without codable children: Secondary | ICD-10-CM | POA: Diagnosis present

## 2011-05-15 DIAGNOSIS — K449 Diaphragmatic hernia without obstruction or gangrene: Secondary | ICD-10-CM

## 2011-05-15 DIAGNOSIS — M129 Arthropathy, unspecified: Secondary | ICD-10-CM | POA: Diagnosis present

## 2011-05-15 DIAGNOSIS — Z6835 Body mass index (BMI) 35.0-35.9, adult: Secondary | ICD-10-CM

## 2011-05-15 DIAGNOSIS — Z79899 Other long term (current) drug therapy: Secondary | ICD-10-CM

## 2011-05-15 DIAGNOSIS — K222 Esophageal obstruction: Secondary | ICD-10-CM | POA: Diagnosis present

## 2011-05-15 DIAGNOSIS — K210 Gastro-esophageal reflux disease with esophagitis: Secondary | ICD-10-CM

## 2011-05-15 DIAGNOSIS — F411 Generalized anxiety disorder: Secondary | ICD-10-CM | POA: Diagnosis present

## 2011-05-15 DIAGNOSIS — E669 Obesity, unspecified: Secondary | ICD-10-CM | POA: Diagnosis present

## 2011-05-15 DIAGNOSIS — E785 Hyperlipidemia, unspecified: Secondary | ICD-10-CM | POA: Diagnosis present

## 2011-05-15 DIAGNOSIS — K219 Gastro-esophageal reflux disease without esophagitis: Secondary | ICD-10-CM | POA: Diagnosis present

## 2011-05-15 DIAGNOSIS — Z01812 Encounter for preprocedural laboratory examination: Secondary | ICD-10-CM

## 2011-05-15 DIAGNOSIS — I10 Essential (primary) hypertension: Secondary | ICD-10-CM | POA: Diagnosis present

## 2011-05-15 DIAGNOSIS — E119 Type 2 diabetes mellitus without complications: Secondary | ICD-10-CM | POA: Diagnosis present

## 2011-05-15 DIAGNOSIS — J438 Other emphysema: Secondary | ICD-10-CM | POA: Diagnosis present

## 2011-05-15 HISTORY — PX: LAPAROSCOPIC NISSEN FUNDOPLICATION: SHX1932

## 2011-05-15 LAB — TSH: TSH: 10.415 — ABNORMAL HIGH

## 2011-05-17 LAB — LIPID PANEL
Cholesterol: 137
HDL: 28 — ABNORMAL LOW
Total CHOL/HDL Ratio: 4.9
VLDL: 44 — ABNORMAL HIGH

## 2011-05-17 LAB — POCT I-STAT CREATININE
Creatinine, Ser: 0.9
Operator id: 189501

## 2011-05-17 LAB — COMPREHENSIVE METABOLIC PANEL
Alkaline Phosphatase: 107
BUN: 11
Creatinine, Ser: 0.92
Glucose, Bld: 158 — ABNORMAL HIGH
Potassium: 3.8
Total Bilirubin: 1.3 — ABNORMAL HIGH
Total Protein: 7

## 2011-05-17 LAB — I-STAT 8, (EC8 V) (CONVERTED LAB)
Acid-Base Excess: 1
BUN: 12
Bicarbonate: 24.9 — ABNORMAL HIGH
Chloride: 108
Glucose, Bld: 92
HCT: 43
Hemoglobin: 14.6
Operator id: 189501
Potassium: 3.8
Sodium: 139
TCO2: 26
pCO2, Ven: 36.7 — ABNORMAL LOW
pH, Ven: 7.44 — ABNORMAL HIGH

## 2011-05-17 LAB — DIFFERENTIAL
Eosinophils Absolute: 0.1
Eosinophils Relative: 2
Lymphocytes Relative: 35
Lymphs Abs: 2.2
Monocytes Absolute: 0.4

## 2011-05-17 LAB — POCT CARDIAC MARKERS
CKMB, poc: <1 — ABNORMAL LOW
Myoglobin, poc: 79.3
Operator id: 189501
Troponin i, poc: <0.05

## 2011-05-17 LAB — CBC
HCT: 40.3
Hemoglobin: 13.7
MCV: 85.7
RDW: 13.8
WBC: 6.3

## 2011-05-17 LAB — HEPATIC FUNCTION PANEL
ALT: 29
AST: 34
Albumin: 4.1
Alkaline Phosphatase: 121 — ABNORMAL HIGH
Bilirubin, Direct: 0.2
Indirect Bilirubin: 1.2 — ABNORMAL HIGH
Total Bilirubin: 1.4 — ABNORMAL HIGH
Total Protein: 7.8

## 2011-05-17 LAB — CK TOTAL AND CKMB (NOT AT ARMC)
CK, MB: 1.6
Relative Index: INVALID
Total CK: 88
Total CK: 91

## 2011-05-17 LAB — TROPONIN I
Troponin I: 0.01
Troponin I: 0.01

## 2011-05-17 LAB — TSH: TSH: 4.01

## 2011-05-21 NOTE — Op Note (Signed)
NAMEMarland Kitchen  LEAANN, NEVILS              ACCOUNT NO.:  [id number]  MEDICAL RECORD NO.:  [id number]  LOCATION:  1536                         FACILITY:  [facility]  PHYSICIAN:  Sharlet Salina T. Everly Rubalcava, M.D.DATE OF BIRTH:  [birth year]  DATE OF PROCEDURE:  05/15/2011 DATE OF DISCHARGE:                              OPERATIVE REPORT   PREOPERATIVE DIAGNOSIS:  Large hiatal hernia with gastroesophageal reflux disease.  POSTOPERATIVE DIAGNOSIS:  Large hiatal hernia with gastroesophageal reflux disease.  SURGICAL PROCEDURE:  Laparoscopic repair of large hiatal hernia with Nissen fundoplication.  SURGEON:  Lorne Skeens. Kaydee Magel, M.D.  ASSISTANT:  Mary Sella. Andrey Campanile, MD.  BRIEF HISTORY:  This patient is a 60 year old female with a long history of chronic heartburn and reflux symptoms, known hiatal hernia, and a history of esophageal stricture requiring dilatation.  She was recently hospitalized with anemia and hemoglobin of 7 and had an extensive workup with conclusion that she likely had bleeding from a Cameron ulcer secondary to her large hiatal hernia.  Previous imaging has shown about half the stomach herniated into the chest.  With this constellation of findings, we have recommended proceeding with laparoscopic repair of her hiatal hernia with Nissen fundoplication.  We discussed the nature of the procedure, and alternatives and risks of anesthetic complications, bleeding, infection, stomach or esophageal injury, and long-term risks of recurrent hernia, gas bloat, dysphagia.  We discussed the likelihood this would relieve her symptoms.  She has agreed and was brought to the operating room for this procedure.  DESCRIPTION OF OPERATION:  The patient was brought to the operating room, placed supine position on the table, and general endotracheal anesthesia was induced.  The abdomen was widely sterilely prepped and draped.  PAS were in place.  Correct patient procedure were verified. She received  preoperative IV antibiotics.  Access was obtained in the left upper quadrant with a 5 mm Optiview trocar without difficulty and pneumoperitoneum established.  There was no evidence of trocar injury. Under direct vision, a 5 mm trocar was placed laterally in the right upper quadrant, a 12 mm trocar in the right upper quadrant midclavicular line, a 12 mm trocar just above and to the left of the umbilicus for the camera port, and an additional 5 mm trocar in the left flank.  Through a 5 mm subxiphoid site, the Nathanson retractor was placed and the left lobe of the liver elevated with excellent exposure of the hiatus and upper stomach.  There was an obvious very large hiatal hernia.  With careful traction, the stomach was reduced down from the hernia and again at least half of the stomach was up in the chest.  The stomach was able to be reduced and then beginning along the anterior border of the hiatus, the hernia sac was incised and hernia sac stripped down out of the mediastinum.  We initially dissected the entire anterior hiatus and pulled the hernia sac and large fat pad down, and the esophagus was identified.  Dissection was continued down the left crus as far as could be easily seen clearing the crus.  We then divided the pars flaccida in an avascular area and the anterior border of the right crus was identified.  The peritoneum here was incised and the right crus mobilized and the hernia content dissected down inferiorly.  A large fat pad was reduced from the hiatus and the esophagus and EG junction were again identified from the right side and the right crus was dissected posteriorly down to the confluence of the crura and we could see the base of the left crus extending anteriorly as well at this point. Following this, the short gastrics were taken down choosing an area in the distal fundus and the lesser sac was entered and the greater curve was mobilized, taking the short gastrics  down with Harmonic scalpel working up toward the angle of His and the EG junction and the angle of His were completely mobilized off the posterior left crus.  At this point, the Penrose drain was passed around the EG junction and elevating the EG junction and retracted inferiorly, further mediastinal dissection was done fully mobilizing the distal esophagus and removing all adhesions and areolar attachments and some final attachments along the posterior crura were completely dissected in the entire hiatus.  At this point, we had very good esophageal length with several cm of esophagus lying in the abdominal cavity without any tension.  A posterior crural repair was then done with interrupted 0 Ethibond sutures with pledgeted working anteriorly.  As we got close to completing the crural repair, a lighted 56 bougie dilator was passed orally down into the stomach without any difficulty.  Using this to adjust the size of the hiatus, we put 1 more pledgeted suture in posteriorly and completed the crural repair.  Following this, a Surgisis hiatal soft tissue patch was used to overlay and reinforce the crural repair.  This was moistened and introduced into the abdomen.  I had to cut a little further posterior slip, so we could wrap the Surgisis anterior to the esophagus as well. The Surgisis was deployed over the crural repair and out laterally over the diaphragm on either side.  It was then sutured in place with a number of interrupted 3-0 Vicryl sutures using an SH needle and finally the patch was sutured anteriorly back to itself at the anterior edge of the diaphragm at the hiatus.  This appeared to provide nice broad coverage of the repair in all directions.  Following this, a mobile area fundus was chosen and brought easily back behind the esophagus and a contiguous area fundus was carefully chosen for the wrap, so that there was no redundancy nor any excess tension.  This was accomplished  with the 56 dilator back in place.  The wrap was then constructed with 3 interrupted 0 Ethibond sutures completing a 360 degree wrap incorporating anterior bites of the esophagus.  The wrap was 2 cm in length and under no tension.  With the dilator removed, it was seen to be nice and floppy.  The abdomen was then carefully inspected for hemostasis and there was no evidence of bleeding, bowel injury, and other problem.  The Nathanson retractor was moved under direct vision. All CO2 was evacuated, trocar was removed.  Incisions were further infiltrated with Marcaine with epinephrine.  Skin was closed with subcuticular Monocryl and Dermabond.  Sponge and needle counts were correct.  The patient was taken to recovery in good condition.     Lorne Skeens. Danisa Kopec, M.D.     Tory Emerald  D:  05/15/2011  T:  05/16/2011  Job:  [id number]  Electronically Signed by Glenna Fellows M.D. on 05/21/2011 02:47:30 PM

## 2011-06-08 ENCOUNTER — Ambulatory Visit (INDEPENDENT_AMBULATORY_CARE_PROVIDER_SITE_OTHER): Payer: Medicare Other | Admitting: General Surgery

## 2011-06-08 ENCOUNTER — Encounter (INDEPENDENT_AMBULATORY_CARE_PROVIDER_SITE_OTHER): Payer: Self-pay | Admitting: General Surgery

## 2011-06-08 VITALS — BP 122/88 | HR 60 | Temp 97.5°F | Resp 18 | Ht 67.0 in | Wt 219.5 lb

## 2011-06-08 DIAGNOSIS — Z09 Encounter for follow-up examination after completed treatment for conditions other than malignant neoplasm: Secondary | ICD-10-CM

## 2011-06-08 NOTE — Patient Instructions (Signed)
Gradually and cautiously at the solid food. All for questions or concerns.

## 2011-06-08 NOTE — Progress Notes (Signed)
Patient returns 3 weeks following laparoscopic repair of her large hiatal hernia and Nissen fundoplication. She is extremely happy with how she is feeling. She states that the reflux and heartburn that she has had for 25 years is completely resolved. She has not been taking any acid reduction medication. She's tolerating her full liquid diet well and has begun to add some solid foods with just occasional mild dysphagia.  On exam she appears well. Her abdomen is soft and nontender. Wounds healing nicely.  Assessment plan: Doing extremely well following laparoscopic hiatal hernia repair and Nissen fundoplication. 2 continue to gradually add a solid foods. I will see her back in 4-6 weeks.

## 2011-07-05 ENCOUNTER — Encounter (INDEPENDENT_AMBULATORY_CARE_PROVIDER_SITE_OTHER): Payer: Medicare Other | Admitting: General Surgery

## 2011-07-10 ENCOUNTER — Other Ambulatory Visit (HOSPITAL_COMMUNITY): Payer: Self-pay | Admitting: Gastroenterology

## 2011-07-10 DIAGNOSIS — R4702 Dysphasia: Secondary | ICD-10-CM

## 2011-07-13 ENCOUNTER — Ambulatory Visit (HOSPITAL_COMMUNITY)
Admission: RE | Admit: 2011-07-13 | Discharge: 2011-07-13 | Disposition: A | Discharge status: Home / Self Care | Payer: Medicare Other | Source: Ambulatory Visit | Attending: Gastroenterology | Admitting: Gastroenterology

## 2011-07-13 DIAGNOSIS — R4702 Dysphasia: Secondary | ICD-10-CM

## 2011-07-13 DIAGNOSIS — R1310 Dysphagia, unspecified: Secondary | ICD-10-CM | POA: Insufficient documentation

## 2011-07-19 ENCOUNTER — Encounter (INDEPENDENT_AMBULATORY_CARE_PROVIDER_SITE_OTHER): Payer: Medicare Other | Admitting: General Surgery

## 2011-10-12 ENCOUNTER — Other Ambulatory Visit (HOSPITAL_COMMUNITY): Payer: Self-pay | Admitting: Family Medicine

## 2011-10-12 DIAGNOSIS — Z1231 Encounter for screening mammogram for malignant neoplasm of breast: Secondary | ICD-10-CM

## 2011-10-15 ENCOUNTER — Ambulatory Visit (HOSPITAL_COMMUNITY)
Admission: RE | Admit: 2011-10-15 | Discharge: 2011-10-15 | Disposition: A | Discharge status: Home / Self Care | Payer: Medicare Other | Source: Ambulatory Visit | Attending: Family Medicine | Admitting: Family Medicine

## 2011-10-15 DIAGNOSIS — Z1231 Encounter for screening mammogram for malignant neoplasm of breast: Secondary | ICD-10-CM

## 2013-08-24 ENCOUNTER — Other Ambulatory Visit: Payer: Self-pay | Admitting: Family Medicine

## 2013-08-24 ENCOUNTER — Ambulatory Visit
Admission: RE | Admit: 2013-08-24 | Discharge: 2013-08-24 | Disposition: A | Discharge status: Home / Self Care | Payer: Medicare Other | Source: Ambulatory Visit | Attending: Family Medicine | Admitting: Family Medicine

## 2013-08-24 DIAGNOSIS — R0602 Shortness of breath: Secondary | ICD-10-CM

## 2014-01-27 ENCOUNTER — Other Ambulatory Visit: Payer: Self-pay | Admitting: Family Medicine

## 2015-02-03 ENCOUNTER — Other Ambulatory Visit: Payer: Self-pay | Admitting: Family Medicine

## 2015-02-03 DIAGNOSIS — E2839 Other primary ovarian failure: Secondary | ICD-10-CM

## 2015-02-07 ENCOUNTER — Other Ambulatory Visit: Payer: Self-pay | Admitting: Physician Assistant

## 2015-02-07 ENCOUNTER — Other Ambulatory Visit: Payer: Self-pay

## 2015-02-07 ENCOUNTER — Ambulatory Visit
Admission: RE | Admit: 2015-02-07 | Discharge: 2015-02-07 | Disposition: A | Discharge status: Home / Self Care | Payer: Medicare Other | Source: Ambulatory Visit | Attending: Family Medicine | Admitting: Family Medicine

## 2015-02-07 DIAGNOSIS — E2839 Other primary ovarian failure: Secondary | ICD-10-CM

## 2015-02-07 DIAGNOSIS — N63 Unspecified lump in unspecified breast: Secondary | ICD-10-CM

## 2015-02-10 ENCOUNTER — Other Ambulatory Visit: Payer: Self-pay | Admitting: Physician Assistant

## 2015-02-10 DIAGNOSIS — N63 Unspecified lump in unspecified breast: Secondary | ICD-10-CM

## 2015-02-14 ENCOUNTER — Ambulatory Visit
Admission: RE | Admit: 2015-02-14 | Discharge: 2015-02-14 | Disposition: A | Discharge status: Home / Self Care | Payer: Medicare Other | Source: Ambulatory Visit | Attending: Physician Assistant | Admitting: Physician Assistant

## 2015-02-14 ENCOUNTER — Other Ambulatory Visit: Payer: Self-pay | Admitting: Physician Assistant

## 2015-02-14 DIAGNOSIS — N63 Unspecified lump in unspecified breast: Secondary | ICD-10-CM

## 2015-09-11 ENCOUNTER — Emergency Department (HOSPITAL_COMMUNITY): Payer: Medicare Other

## 2015-09-11 ENCOUNTER — Encounter (HOSPITAL_COMMUNITY): Payer: Self-pay | Admitting: Emergency Medicine

## 2015-09-11 ENCOUNTER — Observation Stay (HOSPITAL_COMMUNITY): Payer: Medicare Other

## 2015-09-11 ENCOUNTER — Inpatient Hospital Stay (HOSPITAL_COMMUNITY)
Admission: EM | Admit: 2015-09-11 | Discharge: 2015-09-14 | DRG: 419 | Disposition: A | Discharge status: Home / Self Care | Payer: Medicare Other | Attending: Internal Medicine | Admitting: Internal Medicine

## 2015-09-11 DIAGNOSIS — Z888 Allergy status to other drugs, medicaments and biological substances status: Secondary | ICD-10-CM

## 2015-09-11 DIAGNOSIS — J439 Emphysema, unspecified: Secondary | ICD-10-CM | POA: Diagnosis present

## 2015-09-11 DIAGNOSIS — R079 Chest pain, unspecified: Secondary | ICD-10-CM | POA: Diagnosis not present

## 2015-09-11 DIAGNOSIS — K8062 Calculus of gallbladder and bile duct with acute cholecystitis without obstruction: Principal | ICD-10-CM | POA: Diagnosis present

## 2015-09-11 DIAGNOSIS — K8042 Calculus of bile duct with acute cholecystitis without obstruction: Secondary | ICD-10-CM | POA: Insufficient documentation

## 2015-09-11 DIAGNOSIS — Z8249 Family history of ischemic heart disease and other diseases of the circulatory system: Secondary | ICD-10-CM

## 2015-09-11 DIAGNOSIS — F419 Anxiety disorder, unspecified: Secondary | ICD-10-CM | POA: Diagnosis present

## 2015-09-11 DIAGNOSIS — Z8719 Personal history of other diseases of the digestive system: Secondary | ICD-10-CM

## 2015-09-11 DIAGNOSIS — K810 Acute cholecystitis: Secondary | ICD-10-CM | POA: Diagnosis present

## 2015-09-11 DIAGNOSIS — I10 Essential (primary) hypertension: Secondary | ICD-10-CM | POA: Diagnosis present

## 2015-09-11 DIAGNOSIS — R1013 Epigastric pain: Secondary | ICD-10-CM

## 2015-09-11 DIAGNOSIS — K8000 Calculus of gallbladder with acute cholecystitis without obstruction: Secondary | ICD-10-CM | POA: Diagnosis present

## 2015-09-11 DIAGNOSIS — R739 Hyperglycemia, unspecified: Secondary | ICD-10-CM | POA: Diagnosis present

## 2015-09-11 DIAGNOSIS — Z87891 Personal history of nicotine dependence: Secondary | ICD-10-CM

## 2015-09-11 DIAGNOSIS — R112 Nausea with vomiting, unspecified: Secondary | ICD-10-CM | POA: Diagnosis present

## 2015-09-11 DIAGNOSIS — K219 Gastro-esophageal reflux disease without esophagitis: Secondary | ICD-10-CM | POA: Diagnosis present

## 2015-09-11 DIAGNOSIS — L299 Pruritus, unspecified: Secondary | ICD-10-CM | POA: Diagnosis not present

## 2015-09-11 DIAGNOSIS — E119 Type 2 diabetes mellitus without complications: Secondary | ICD-10-CM

## 2015-09-11 DIAGNOSIS — E785 Hyperlipidemia, unspecified: Secondary | ICD-10-CM | POA: Diagnosis present

## 2015-09-11 DIAGNOSIS — Z79899 Other long term (current) drug therapy: Secondary | ICD-10-CM

## 2015-09-11 DIAGNOSIS — Z9889 Other specified postprocedural states: Secondary | ICD-10-CM

## 2015-09-11 DIAGNOSIS — Z885 Allergy status to narcotic agent status: Secondary | ICD-10-CM

## 2015-09-11 DIAGNOSIS — M797 Fibromyalgia: Secondary | ICD-10-CM | POA: Diagnosis present

## 2015-09-11 DIAGNOSIS — R1110 Vomiting, unspecified: Secondary | ICD-10-CM

## 2015-09-11 DIAGNOSIS — K8050 Calculus of bile duct without cholangitis or cholecystitis without obstruction: Secondary | ICD-10-CM

## 2015-09-11 LAB — HEPATIC FUNCTION PANEL
ALK PHOS: 224 U/L — AB (ref 38–126)
ALT: 369 U/L — AB (ref 14–54)
AST: 350 U/L — AB (ref 15–41)
Albumin: 3.8 g/dL (ref 3.5–5.0)
BILIRUBIN DIRECT: 4.6 mg/dL — AB (ref 0.1–0.5)
BILIRUBIN INDIRECT: 2.8 mg/dL — AB (ref 0.3–0.9)
BILIRUBIN TOTAL: 7.4 mg/dL — AB (ref 0.3–1.2)
Total Protein: 7.5 g/dL (ref 6.5–8.1)

## 2015-09-11 LAB — CBC
HEMATOCRIT: 44.8 % (ref 36.0–46.0)
HEMOGLOBIN: 15 g/dL (ref 12.0–15.0)
MCH: 29.8 pg (ref 26.0–34.0)
MCHC: 33.5 g/dL (ref 30.0–36.0)
MCV: 89.1 fL (ref 78.0–100.0)
Platelets: 200 10*3/uL (ref 150–400)
RBC: 5.03 MIL/uL (ref 3.87–5.11)
RDW: 13 % (ref 11.5–15.5)
WBC: 10.9 10*3/uL — ABNORMAL HIGH (ref 4.0–10.5)

## 2015-09-11 LAB — BASIC METABOLIC PANEL
ANION GAP: 14 (ref 5–15)
BUN: 9 mg/dL (ref 6–20)
CHLORIDE: 101 mmol/L (ref 101–111)
CO2: 25 mmol/L (ref 22–32)
Calcium: 9.6 mg/dL (ref 8.9–10.3)
Creatinine, Ser: 0.79 mg/dL (ref 0.44–1.00)
GFR calc Af Amer: >60 mL/min (ref >60)
GLUCOSE: 169 mg/dL — AB (ref 65–99)
POTASSIUM: 3.5 mmol/L (ref 3.5–5.1)
Sodium: 140 mmol/L (ref 135–145)

## 2015-09-11 LAB — GLUCOSE, CAPILLARY
GLUCOSE-CAPILLARY: 136 mg/dL — AB (ref 65–99)
Glucose-Capillary: 155 mg/dL — ABNORMAL HIGH (ref 65–99)
Glucose-Capillary: 97 mg/dL (ref 65–99)

## 2015-09-11 LAB — MRSA PCR SCREENING: MRSA by PCR: NEGATIVE

## 2015-09-11 LAB — LIPID PANEL
CHOL/HDL RATIO: 4.6 ratio
Cholesterol: 180 mg/dL (ref 0–200)
HDL: 39 mg/dL — AB (ref >40)
LDL Cholesterol: 124 mg/dL — ABNORMAL HIGH (ref 0–99)
TRIGLYCERIDES: 84 mg/dL (ref <150)
VLDL: 17 mg/dL (ref 0–40)

## 2015-09-11 LAB — I-STAT TROPONIN, ED: Troponin i, poc: 0 ng/mL (ref 0.00–0.08)

## 2015-09-11 LAB — TROPONIN I

## 2015-09-11 LAB — I-STAT CG4 LACTIC ACID, ED: LACTIC ACID, VENOUS: 1.95 mmol/L (ref 0.5–2.0)

## 2015-09-11 MED ORDER — POTASSIUM CHLORIDE IN NACL 20-0.9 MEQ/L-% IV SOLN
INTRAVENOUS | Status: DC
  Administered 2015-09-11 – 2015-09-13 (×4): via INTRAVENOUS
  Filled 2015-09-11 (×7): qty 1000

## 2015-09-11 MED ORDER — ENOXAPARIN SODIUM 40 MG/0.4ML ~~LOC~~ SOLN: 40.0000 mg | SUBCUTANEOUS | Status: DC

## 2015-09-11 MED ORDER — ALUM & MAG HYDROXIDE-SIMETH 200-200-20 MG/5ML PO SUSP
30.0000 mL | Freq: Four times a day (QID) | ORAL | Status: DC | PRN
  Administered 2015-09-11: 30 mL via ORAL
  Filled 2015-09-11: qty 30

## 2015-09-11 MED ORDER — LISINOPRIL 20 MG PO TABS
20.0000 mg | ORAL_TABLET | Freq: Every day | ORAL | Status: DC
  Administered 2015-09-11 – 2015-09-14 (×3): 20 mg via ORAL
  Filled 2015-09-11 (×3): qty 1

## 2015-09-11 MED ORDER — GI COCKTAIL ~~LOC~~
30.0000 mL | Freq: Once | ORAL | Status: AC
  Administered 2015-09-12: 30 mL via ORAL
  Filled 2015-09-11 (×2): qty 30

## 2015-09-11 MED ORDER — ACETAMINOPHEN 325 MG PO TABS
650.0000 mg | ORAL_TABLET | Freq: Four times a day (QID) | ORAL | Status: DC | PRN
Start: 2015-09-11 — End: 2015-09-14
  Administered 2015-09-13: 650 mg via ORAL
  Filled 2015-09-11: qty 2

## 2015-09-11 MED ORDER — INSULIN ASPART 100 UNIT/ML ~~LOC~~ SOLN
0.0000 [IU] | Freq: Three times a day (TID) | SUBCUTANEOUS | Status: DC
  Administered 2015-09-11: 2 [IU] via SUBCUTANEOUS

## 2015-09-11 MED ORDER — SUCRALFATE 1 GM/10ML PO SUSP: 1.0000 g | Freq: Three times a day (TID) | ORAL | Status: DC

## 2015-09-11 MED ORDER — SODIUM CHLORIDE 0.9% FLUSH
3.0000 mL | Freq: Two times a day (BID) | INTRAVENOUS | Status: DC
  Administered 2015-09-12 – 2015-09-13 (×2): 3 mL via INTRAVENOUS

## 2015-09-11 MED ORDER — ONDANSETRON HCL 4 MG/2ML IJ SOLN
4.0000 mg | Freq: Once | INTRAMUSCULAR | Status: AC
  Administered 2015-09-11: 4 mg via INTRAVENOUS
  Filled 2015-09-11: qty 2

## 2015-09-11 MED ORDER — DEXTROSE 5 % IV SOLN
2.0000 g | INTRAVENOUS | Status: DC
  Administered 2015-09-11 – 2015-09-13 (×4): 2 g via INTRAVENOUS
  Filled 2015-09-11 (×5): qty 2

## 2015-09-11 MED ORDER — PANTOPRAZOLE SODIUM 40 MG IV SOLR
40.0000 mg | Freq: Two times a day (BID) | INTRAVENOUS | Status: DC
  Administered 2015-09-11 – 2015-09-13 (×4): 40 mg via INTRAVENOUS
  Filled 2015-09-11 (×4): qty 40

## 2015-09-11 MED ORDER — TRAMADOL HCL 50 MG PO TABS
50.0000 mg | ORAL_TABLET | Freq: Four times a day (QID) | ORAL | Status: DC | PRN
  Administered 2015-09-11: 50 mg via ORAL
  Filled 2015-09-11: qty 1

## 2015-09-11 MED ORDER — PROMETHAZINE HCL 25 MG RE SUPP: 25.0000 mg | Freq: Four times a day (QID) | RECTAL | Status: DC | PRN

## 2015-09-11 MED ORDER — INSULIN ASPART 100 UNIT/ML ~~LOC~~ SOLN: 0.0000 [IU] | Freq: Every day | SUBCUTANEOUS | Status: DC

## 2015-09-11 MED ORDER — POTASSIUM CHLORIDE IN NACL 40-0.9 MEQ/L-% IV SOLN
INTRAVENOUS | Status: DC
  Administered 2015-09-11: 100 mL/h via INTRAVENOUS
  Filled 2015-09-11: qty 1000

## 2015-09-11 MED ORDER — ALBUTEROL 90 MCG/ACT IN AERS: 2.0000 | INHALATION_SPRAY | Freq: Four times a day (QID) | RESPIRATORY_TRACT | Status: DC | PRN

## 2015-09-11 MED ORDER — MORPHINE SULFATE (PF) 2 MG/ML IV SOLN
2.0000 mg | INTRAVENOUS | Status: DC | PRN
  Administered 2015-09-12 – 2015-09-13 (×3): 2 mg via INTRAVENOUS
  Filled 2015-09-11 (×3): qty 1

## 2015-09-11 MED ORDER — PROMETHAZINE HCL 25 MG/ML IJ SOLN
25.0000 mg | Freq: Four times a day (QID) | INTRAMUSCULAR | Status: DC | PRN
  Administered 2015-09-11 – 2015-09-13 (×2): 25 mg via INTRAVENOUS
  Administered 2015-09-13: 12.5 mg via INTRAVENOUS
  Filled 2015-09-11 (×2): qty 1

## 2015-09-11 MED ORDER — ALBUTEROL SULFATE (2.5 MG/3ML) 0.083% IN NEBU
2.5000 mg | INHALATION_SOLUTION | Freq: Four times a day (QID) | RESPIRATORY_TRACT | Status: DC | PRN
  Administered 2015-09-12 – 2015-09-13 (×3): 2.5 mg via RESPIRATORY_TRACT
  Filled 2015-09-11 (×2): qty 3

## 2015-09-11 MED ORDER — PROMETHAZINE HCL 25 MG PO TABS: 25.0000 mg | ORAL_TABLET | Freq: Four times a day (QID) | ORAL | Status: DC | PRN

## 2015-09-11 MED ORDER — ASPIRIN EC 81 MG PO TBEC
81.0000 mg | DELAYED_RELEASE_TABLET | Freq: Every day | ORAL | Status: DC
  Administered 2015-09-12 – 2015-09-14 (×2): 81 mg via ORAL
  Filled 2015-09-11 (×2): qty 1

## 2015-09-11 MED ORDER — ACETAMINOPHEN 650 MG RE SUPP: 650.0000 mg | Freq: Four times a day (QID) | RECTAL | Status: DC | PRN

## 2015-09-11 MED ORDER — METOCLOPRAMIDE HCL 5 MG/ML IJ SOLN
10.0000 mg | Freq: Once | INTRAMUSCULAR | Status: AC
  Administered 2015-09-11: 10 mg via INTRAVENOUS
  Filled 2015-09-11: qty 2

## 2015-09-11 MED ORDER — HEPARIN SODIUM (PORCINE) 5000 UNIT/ML IJ SOLN
5000.0000 [IU] | Freq: Three times a day (TID) | INTRAMUSCULAR | Status: AC
  Administered 2015-09-11 – 2015-09-12 (×4): 5000 [IU] via SUBCUTANEOUS
  Filled 2015-09-11 (×4): qty 1

## 2015-09-11 MED ORDER — SODIUM CHLORIDE 0.9 % IV BOLUS (SEPSIS)
1000.0000 mL | Freq: Once | INTRAVENOUS | Status: AC
  Administered 2015-09-11: 1000 mL via INTRAVENOUS

## 2015-09-11 MED ORDER — TRAMADOL HCL 50 MG PO TABS
50.0000 mg | ORAL_TABLET | Freq: Four times a day (QID) | ORAL | Status: DC | PRN
  Administered 2015-09-11: 100 mg via ORAL
  Filled 2015-09-11: qty 2

## 2015-09-11 MED ORDER — PROMETHAZINE HCL 25 MG/ML IJ SOLN
25.0000 mg | Freq: Once | INTRAMUSCULAR | Status: AC
  Administered 2015-09-11: 25 mg via INTRAVENOUS
  Filled 2015-09-11: qty 1

## 2015-09-11 NOTE — ED Notes (Signed)
Per EMS, patient comes in for chest pain. Has pressure all day in her chest. She took some of her own nitro but it had expired. She took 2 more nitro tabs with ems, and  of aspirin before they arrived. She also had nausea with clear vomit. bp with ems: 138/70, p 70, 20g present in left FA. Normal sinus en route, rate in the 70s.

## 2015-09-11 NOTE — ED Provider Notes (Signed)
CSN: [id number]     Arrival date & time 09/11/15  4098 History   First MD Initiated Contact with Patient 09/11/15 0543     Chief Complaint  Patient presents with  . Chest Pain     (Consider location/radiation/quality/duration/timing/severity/associated sxs/prior Treatment) HPI Comments: 65 year old female with past medical history including hypertension, hyperlipidemia, type 2 diabetes mellitus, GERD, fibromyalgia who presents with chest pain. Patient states that approximately one day ago while at rest, she began having central chest pressure after eating which has persisted throughout the day and has not been relieved by position changes. The pain is severe and constant. No associated heart burn sensation. She endorses associated diaphoresis and nausea. She has had nitroglycerin by EMS as well as aspirin without relief. She received Zofran in transport for vomiting. She denies any abdominal pain, cough/cold symptoms, fevers, or recent illness. No recent travel, history of cancer, leg swelling, or history of blood clots. Family history notable for mother with history of MI, grandfather who died of MI in his 71s.  Patient is a 65 y.o. female presenting with chest pain. The history is provided by the patient.  Chest Pain   Past Medical History  Diagnosis Date  . Anemia   . Anxiety   . Arthritis   . Diabetes mellitus   . Emphysema of lung (HCC)   . GERD (gastroesophageal reflux disease)   . Hyperlipidemia   . Hypertension   . Neuromuscular disorder (HCC)   . Seizures (HCC)   . Thyroid disease    Past Surgical History  Procedure Laterality Date  . Tonsilectomy, adenoidectomy, bilateral myringotomy and tubes  1968  . Cesarean section  1984  . Abdominal hysterectomy  1992    partial  . Oophorectomy  2004  . Femur distal locking screw insertion  2004    left foot  . Laparoscopic nissen fundoplication  05/15/11    lap nissen and niatal hernia    Family History  Problem Relation Age  of Onset  . Cancer Mother     breast  . Cirrhosis Father    Social History  Substance Use Topics  . Smoking status: Former Smoker    Quit date: 04/26/2004  . Smokeless tobacco: Never Used  . Alcohol Use: No   OB History    No data available     Review of Systems  Cardiovascular: Positive for chest pain.   10 Systems reviewed and are negative for acute change except as noted in the HPI.    Allergies  Divalproex sodium; Codeine; Neurontin; and Wellbutrin  Home Medications   Prior to Admission medications   Medication Sig Start Date End Date Taking? Authorizing Provider  albuterol (PROVENTIL,VENTOLIN) 90 MCG/ACT inhaler Inhale 2 puffs into the lungs as needed.     Yes Historical Provider, MD  ketorolac (TORADOL) 10 MG tablet Take 10 mg by mouth every 6 (six) hours as needed for moderate pain.   Yes Historical Provider, MD  lisinopril (PRINIVIL,ZESTRIL) 20 MG tablet Take 20 mg by mouth daily.   Yes Historical Provider, MD  traMADol (ULTRAM) 50 MG tablet Take 50 mg by mouth every 6 (six) hours as needed for moderate pain.    Yes Historical Provider, MD   BP 166/62 mmHg  Pulse 67  Temp(Src) 98.9 F (37.2 C) (Oral)  Resp 13  Ht 5\' 5"  (1.651 m)  Wt 200 lb (90.719 kg)  BMI 33.28 kg/m2  SpO2 94% Physical Exam  Constitutional: She is oriented to person, place, and time.  She appears well-developed and well-nourished. No distress.  uncomfortable  HENT:  Head: Normocephalic and atraumatic.  Moist mucous membranes  Eyes: Conjunctivae are normal. Pupils are equal, round, and reactive to light.  Neck: Neck supple.  Cardiovascular: Normal rate, regular rhythm and normal heart sounds.   No murmur heard. Pulmonary/Chest: Effort normal and breath sounds normal.  Abdominal: Soft. Bowel sounds are normal. She exhibits no distension. There is no tenderness.  Musculoskeletal: She exhibits no edema.  Neurological: She is alert and oriented to person, place, and time.  Fluent speech   Skin: Skin is warm and dry.  Psychiatric: She has a normal mood and affect. Judgment normal.  Nursing note and vitals reviewed.   ED Course  Procedures (including critical care time) Labs Review Labs Reviewed  BASIC METABOLIC PANEL - Abnormal; Notable for the following:    Glucose, Bld 169 (*)    All other components within normal limits  CBC - Abnormal; Notable for the following:    WBC 10.9 (*)    All other components within normal limits  I-STAT TROPOININ, ED  I-STAT CG4 LACTIC ACID, ED    Imaging Review Dg Chest 2 View  09/11/2015  CLINICAL DATA:  65 year old female with acute chest pain for 2 days. EXAM: CHEST  2 VIEW COMPARISON:  08/24/2013 and prior chest radiographs FINDINGS: The cardiomediastinal silhouette is unremarkable. Right hemidiaphragm elevation is unchanged. Mild chronic peribronchial thickening again noted. There is no evidence of focal airspace disease, pulmonary edema, suspicious pulmonary nodule/mass, pleural effusion, or pneumothorax. No acute bony abnormalities are identified. IMPRESSION: No evidence of acute cardiopulmonary disease. Mild chronic peribronchial thickening. Electronically Signed   By: Harmon Pier M.D.   On: 09/11/2015 07:10   I have personally reviewed and evaluated these lab results as part of my medical decision-making.   EKG Interpretation   Date/Time:  Sunday September 11 2015 05:07:28 EST Ventricular Rate:  57 PR Interval:  126 QRS Duration: 92 QT Interval:  412 QTC Calculation: 401 R Axis:   51 Text Interpretation:  Sinus bradycardia Otherwise normal ECG No  significant change since last tracing Confirmed by Violette Morneault MD, Omarie Parcell  [phone]) on 09/11/2015 5:13:20 AM     Medications  gi cocktail (Maalox,Lidocaine,Donnatal) (0 mLs Oral Hold 09/11/15 0744)  ondansetron (ZOFRAN) injection 4 mg (4 mg Intravenous Given 09/11/15 0537)  sodium chloride 0.9 % bolus 1,000 mL (1,000 mLs Intravenous New Bag/Given 09/11/15 0657)  promethazine  (PHENERGAN) injection 25 mg (25 mg Intravenous Given 09/11/15 0654)  metoCLOPramide (REGLAN) injection 10 mg (10 mg Intravenous Given 09/11/15 0747)    MDM   Final diagnoses:  Chest pain, unspecified chest pain type  Intractable vomiting with nausea, vomiting of unspecified type   PT p/w 1 day of central chest pressure associated with diaphoresis and vomiting. She received aspirin and nitroglycerin as well as Zofran by EMS. On exam, she was uncomfortable, holding an emesis bag but in no acute distress. Vital signs notable for mild hypertension. EKG shows sinus rhythm without ischemic changes. No abdominal tenderness on exam. Gave the patient Zofran and later Phenergan as well as IV fluid bolus, as she began vomiting in ED. Obtained chest x-ray as well as basic lab work including troponin. Patient has no risk factors for PE and Wells score is 0. Patient has continued to have vomiting and I have given her Phenergan and later Reglan for symptoms. She does state, however that her vomiting began tonight but her chest pain has been at least 24  hours in duration. Although ddx includes GI process, I am concerned about ACS due to risk factors and family hx. Her HEART score is 4 and I have discussed admission for chest pain rule out; pt in agreement w/ plan. Discussed w/ internal medicine, Dr. Senaida Ores, and pt admitted for further care.   Laurence Spates, MD 09/11/15 [phone]

## 2015-09-11 NOTE — ED Notes (Signed)
Dr. Clarene Duke made aware that patient continues to vomit after being given phenergan.

## 2015-09-11 NOTE — H&P (Signed)
Date: 09/11/2015               Patient Name:  Tamara Chambers MRN: [id number]  DOB: [birth year] Age / Sex: 65 y.o., female   PCP: Viann Shove, MD         Medical Service: Internal Medicine Teaching Service         Attending Physician: Dr. Doneen Poisson    First Contact: Dr. Johnny Bridge Pager: [phone]  Second Contact: Dr. Senaida Ores Pager: [phone]       After Hours (After 5p/  First Contact Pager: [phone]  weekends / holidays): Second Contact Pager: [phone]   Chief Complaint: chest pain   History of Present Illness:   This is a 65 yo woman with past medical history of HTN, HLD, T2Dm, chronic GERD,  diverticulosis, PUD in 1990s,  History of esophageal strictures requiring dilation, and lap repair of hiatal hernia 2012, fibromyalgia, major depression, OSA, who is here for central chest pain since Friday. Patient says that on Thursday, her mother dropped off a tuna sandwich and she ate half of that, and the other leftover half on Friday afternoon. Then 4 hours after this, around 7:30 PM,she started having constant squeezing pressure like pain in her central chest and epigastric area, which was nonradiating to the arm, back and jaw. The pain was about an 8/10 at its worst. She said she tried taking nitroglycerin and aspirin and it only decreased the pain to 7/10. She mentions that her back feels sore like she had a workout. The pain continued to stay throughout Saturday and that is why she came in this morning. She denies headaches or dizziness. She says the pain worsens when she takes a deep breath.She says the pain is improved when she leans forward and pain is worsened by sitting or lying down.   She denies any increased shortness of breath.  She says 2 weeks ago, she would be able to climb a flight of stairs before getting short of breath. She denies palpitations. She has expired nitroglycerin at home and says she took nitro about twice in the last year, but the pain was not like  this.  She sleeps daily on 4 pillows, and does not wake up at night with SOB.She does not think laying flat makes her SOB worse but is not sure.    After the chest pain started, she had 8-10 episodes of nausea and 'dry heaving' on Saturday, and one clear liquid nonbloody emesis on Friday night after she ate. She denies diarrhea or blood in stool.    She says she had "low grade fever" on Saturday but did not check it and also feels a little weak. She says she has not slept in 3 days. She is worried about a blood clot though there is no prior history of blood clot or no family history of blood clot.   SHe also has fibromyalgia for which she takes daily toradol and tramadol though she has not taken both in 2 days, and as such her body pain is unbearable.  She was also on some "6 or 7 antidepressants in past" but she has weaned herself off of all of them by herself.   For her hypertension, she takes lisinopril 20 mg daily and does not miss doses except for the last 2 days.  She has a PCP in [address] Dr Jeannetta Nap and she goes to him/her occasionally.   She says she had a cath in 2008 but there were  no blockages and no stents. She had a stress test done a few years ago and she does not remember the results. She does not have a cardiologist here in [place].   Patient was admitted in September  2012 with symptomatic anemia with palpitations and fatigue and she was found to have large hiatal hernia  And underwent elective lap repair with nissen fundoplication in September 2012.   In the ER, i stat troponin 0, and ekg unremarkable. Her pain unresolved by zofran and phenergan and nitro, she she is admitted for ACS rule out.    PMH: as listed above FH: mother had MI and stents at age 80, sister had 'cardiac arrythmia' at age 36.  Grandfather had MI at age 50. Alcoholism in father  SH: smoked 1 ppd for 25 years and quit 12 years ago. She quit alcohol in 90s. No recent illicit drug use.      Meds: Current Facility-Administered Medications  Medication Dose Route Frequency Provider Last Rate Last Dose  . gi cocktail (Maalox,Lidocaine,Donnatal)  30 mL Oral Once Laurence Spates, MD   Stopped at 09/11/15 [phone]   Current Outpatient Prescriptions  Medication Sig Dispense Refill  . albuterol (PROVENTIL,VENTOLIN) 90 MCG/ACT inhaler Inhale 2 puffs into the lungs as needed.      Marland Kitchen ketorolac (TORADOL) 10 MG tablet Take 10 mg by mouth every 6 (six) hours as needed for moderate pain.    Marland Kitchen lisinopril (PRINIVIL,ZESTRIL) 20 MG tablet Take 20 mg by mouth daily.    . traMADol (ULTRAM) 50 MG tablet Take 50 mg by mouth every 6 (six) hours as needed for moderate pain.       Allergies: Allergies as of 09/11/2015 - Review Complete 09/11/2015  Allergen Reaction Noted  . Divalproex sodium Anaphylaxis 04/27/2011  . Codeine Other (See Comments) 04/27/2011  . Neurontin [gabapentin] Other (See Comments) 04/27/2011  . Wellbutrin [bupropion hcl] Other (See Comments) 04/27/2011   Past Medical History  Diagnosis Date  . Anemia   . Anxiety   . Arthritis   . Diabetes mellitus   . Emphysema of lung (HCC)   . GERD (gastroesophageal reflux disease)   . Hyperlipidemia   . Hypertension   . Neuromuscular disorder (HCC)   . Seizures (HCC)   . Thyroid disease    Past Surgical History  Procedure Laterality Date  . Tonsilectomy, adenoidectomy, bilateral myringotomy and tubes  1968  . Cesarean section  1984  . Abdominal hysterectomy  1992    partial  . Oophorectomy  2004  . Femur distal locking screw insertion  2004    left foot  . Laparoscopic nissen fundoplication  05/15/11    lap nissen and niatal hernia    Family History  Problem Relation Age of Onset  . Cancer Mother     breast  . Cirrhosis Father   . Heart failure Mother    Social History   Social History  . Marital Status: Divorced    Spouse Name: N/A  . Number of Children: N/A  . Years of Education: N/A   Occupational  History  . Not on file.   Social History Main Topics  . Smoking status: Former Smoker    Quit date: 04/26/2004  . Smokeless tobacco: Never Used  . Alcohol Use: No  . Drug Use: No  . Sexual Activity: Not on file   Other Topics Concern  . Not on file   Social History Narrative    Review of Systems: Pertinent items noted in HPI  and remainder of comprehensive ROS otherwise negative.  Physical Exam: Blood pressure 128/55, pulse 63, temperature 98.9 F (37.2 C), temperature source Oral, resp. rate 13, height  (1.651 m), weight 200 lb (90.719 kg), SpO2 99 %.  General: Vital signs reviewed. Patient in some discomfort, sitting on bed leaned forward HEENT: somewhat dry mucous membrane, PERRLA  Cardiovascular: regular rate, rhythm, no murmur, no carotid bruits, no JVD  Pulmonary/Chest: Clear to auscultation bilaterally, no wheezes, or crackles Abdominal: Soft, nd, +BS, tender to palpation in epigastric area, negative murphys Extremities: No lower extremity edema bilaterally, pulses symmetric and intact b/l Skin: Warm, dry and intact. No rashes or erythema    Lab results: Results for orders placed or performed during the hospital encounter of 09/11/15 (from the past 24 hour(s))  Basic metabolic panel     Status: Abnormal   Collection Time: 09/11/15  5:57 AM  Result Value Ref Range   Sodium 140 135 - 145 mmol/L   Potassium 3.5 3.5 - 5.1 mmol/L   Chloride 101 101 - 111 mmol/L   CO2 25 22 - 32 mmol/L   Glucose, Bld 169 (H) 65 - 99 mg/dL   BUN 9 6 - 20 mg/dL   Creatinine, Ser 4.09 0.44 - 1.00 mg/dL   Calcium 9.6 8.9 - 81.1 mg/dL   GFR calc non Af Amer >60 >60 mL/min   GFR calc Af Amer >60 >60 mL/min   Anion gap 14 5 - 15  CBC     Status: Abnormal   Collection Time: 09/11/15  5:57 AM  Result Value Ref Range   WBC 10.9 (H) 4.0 - 10.5 K/uL   RBC 5.03 3.87 - 5.11 MIL/uL   Hemoglobin 15.0 12.0 - 15.0 g/dL   HCT 91.4 78.2 - 95.6 %   MCV 89.1 78.0 - 100.0 fL   MCH 29.8 26.0 -  34.0 pg   MCHC 33.5 30.0 - 36.0 g/dL   RDW 21.3 08.6 - 57.8 %   Platelets 200 150 - 400 K/uL  I-stat troponin, ED (not at [facility])     Status: None   Collection Time: 09/11/15  6:05 AM  Result Value Ref Range   Troponin i, poc 0.00 0.00 - 0.08 ng/mL   Comment 3          I-Stat CG4 Lactic Acid, ED     Status: None   Collection Time: 09/11/15  6:38 AM  Result Value Ref Range   Lactic Acid, Venous 1.95 0.5 - 2.0 mmol/L     Imaging results:  Dg Chest 2 View  09/11/2015  CLINICAL DATA:  65 year old female with acute chest pain for 2 days. EXAM: CHEST  2 VIEW COMPARISON:  08/24/2013 and prior chest radiographs FINDINGS: The cardiomediastinal silhouette is unremarkable. Right hemidiaphragm elevation is unchanged. Mild chronic peribronchial thickening again noted. There is no evidence of focal airspace disease, pulmonary edema, suspicious pulmonary nodule/mass, pleural effusion, or pneumothorax. No acute bony abnormalities are identified. IMPRESSION: No evidence of acute cardiopulmonary disease. Mild chronic peribronchial thickening. Electronically Signed   By: Harmon Pier M.D.   On: 09/11/2015 07:10    Other results: EKG: normal EKG, normal sinus rhythm, unchanged from previous tracings, sinus bradycardia.  Assessment & Plan by Problem: Principal Problem:   Chest pain Active Problems:   HTN (hypertension)   Nausea and vomiting   Fibromyalgia   T2DM (type 2 diabetes mellitus) (HCC)   GERD (gastroesophageal reflux disease)   History of tobacco abuse   History  of repair of hiatal hernia   Hyperlipidemia  Acute calculous cholecystitis with gallbladder wall thickening and mild cbd dilation,  rule out ACS: Patient with history of cath in 2008 and intermittent use of nitro in past, coming with 8/10 central pressure like nonradiating constant chest pain since Friday night 4 hours after eating a sandwich. Pain worsened by taking a deep breath and improved by leaning forward. Pain did not  relieve by aspirin and nitroglycerin. In the ER, i stat troponin and ekg unremarkable. I do not think this seems to be a cardiac etiology, as the pain was reproducible on palpation and it was mainly located in epigastric area, and stopped at the xyphoid process.  Unlikely to be PE as wells score 0 and her o2 sats were high 90s, and HR normal. Unlikely to be aortic dissection as pain does not radiate to back and we measured blood pressure which was normal in both arms Her cxr was unremarkable. Her symptoms sound like pericarditis but no friction rub on exam, and EKG did not show diffuse st elevation or any changes at all. Also no cardiac tamponade as no JVD, no muffled heart sounds, and blood pressure normal, and ekg shows no low voltage.    Her pain seems to be more of GI etiology due to her extensive GI history of hiatal hernia repair, h/o esophageal strictures. Also with hertaking NSAID every day, it raises concern for an ulcer, given that she had history of peptic ulcer disease. Also, we obtained a RUQ ultrasound which showed cholelithiasis with gallbladder wall thickening and pericholecystic fluid suspicious for acute cholecystitis  And 2.3 cm gallstone at neck- mild cbd dilation   -consult surgery  -NPO -telemetry -trend troponins -morning EKG -repeat CBC and CMET -lipid panel -HIV -blood cultures   -aspirin daily  -tramadol for pain, hold toradol and other nsaids if there may be an ulcer  -NS wth 40 meq Kcl at 100 cc/hr -GI cocktail and maalox -carafate -Protonix 40 mg BID IV -phenergan 25 mg q6 PRN  -ceftriaxone per pharm    T2DM- no recent a1c on file , pt not on any meds  -order a1c -SSI  HTN BP mildly elevated as pt did not take morning dose. BP same on both sides of arm  -lisinopril 20 gm daily  HLD -order lipid panel  Fibromyalgia -tramadol -hold toradol  IDA -repeat CBC   Dispo: Disposition is deferred at this time, awaiting improvement of current medical  problems. Anticipated discharge in approximately 1 day(s).   The patient does not have a current PCP (Bienvenido Havery Moros, MD) and does need an [facility] hospital follow-up appointment after discharge.  The patient does not have transportation limitations that hinder transportation to clinic appointments.  Signed: Deneise Lever, MD 09/11/2015, 9:40 AM

## 2015-09-11 NOTE — Progress Notes (Signed)
Results of ABD [place] called to this RN. MD notified. No new orders received. Will continue to monitor.   Reginold Agent, RN

## 2015-09-11 NOTE — Plan of Care (Signed)
Problem: Pain Managment: Goal: General experience of comfort will improve Outcome: Completed/Met Date Met:  09/11/15 Pt educated on pain scale and interventions. Pt verbalized understanding.

## 2015-09-11 NOTE — Consult Note (Signed)
Reason for Consult: Acute cholecystitis Referring Physician: Dr. Dorene Grebe is an 65 y.o. female.  HPI: Patient is a 65 year old female who is admitted secondary to epigastric pain patient states this started approximately 2 days ago. She states this started after eating soup. She states the pain had been similar to previously when she's had chest pain which required sublingual nitroglycerin. She stated that upon taking nitroglycerin this did not help with pain and he continued. She states she had associated nausea and dry heaving. Patient presented to the ER for further evaluation.  Upon evaluation ER patient underwent laboratory studies and ultrasound. Ultrasound revealed thickened gallbladder wall as well as stones pericholecystic fluid, a borderline dilated common bile duct 9 mm. Patient did have an elevated WBC count 10.9. Patient's LFTs currently pending. We were asked to see the patient secondary to the acute cholecystitis.    Of note the patient has had a laparoscopic Nissen fundoplication and hiatal hernia repair in 2010 by Dr. Excell Seltzer.   Past Medical History  Diagnosis Date  . Anemia   . Anxiety   . Arthritis   . Diabetes mellitus   . Emphysema of lung ([place])   . GERD (gastroesophageal reflux disease)   . Hyperlipidemia   . Hypertension   . Neuromuscular disorder ([place])   . Seizures ([place])   . Thyroid disease     Past Surgical History  Procedure Laterality Date  . Tonsilectomy, adenoidectomy, bilateral myringotomy and tubes  1968  . Cesarean section  1984  . Abdominal hysterectomy  1992    partial  . Oophorectomy  2004  . Femur distal locking screw insertion  2004    left foot  . Laparoscopic nissen fundoplication  09/81/19    lap nissen and niatal hernia     Family History  Problem Relation Age of Onset  . Cancer Mother     breast  . Cirrhosis Father   . Heart failure Mother     Social History:  reports that she quit smoking about 11 years ago. She  has never used smokeless tobacco. She reports that she does not drink alcohol or use illicit drugs.  Allergies:  Allergies  Allergen Reactions  . Divalproex Sodium Anaphylaxis  . Codeine Other (See Comments)    Patient cannot remember reaction at this time.  . Neurontin [Gabapentin] Other (See Comments)    Cannot remember reaction.  . Wellbutrin [Bupropion Hcl] Other (See Comments)    seizure    Medications: I have reviewed the patient's current medications.  Results for orders placed or performed during the hospital encounter of 09/11/15 (from the past 48 hour(s))  Basic metabolic panel     Status: Abnormal   Collection Time: 09/11/15  5:57 AM  Result Value Ref Range   Sodium 140 135 - 145 mmol/L   Potassium 3.5 3.5 - 5.1 mmol/L   Chloride 101 101 - 111 mmol/L   CO2 25 22 - 32 mmol/L   Glucose, Bld 169 (H) 65 - 99 mg/dL   BUN 9 6 - 20 mg/dL   Creatinine, Ser 0.79 0.44 - 1.00 mg/dL   Calcium 9.6 8.9 - 10.3 mg/dL   GFR calc non Af Amer >60 >60 mL/min   GFR calc Af Amer >60 >60 mL/min    Comment: (NOTE) The eGFR has been calculated using the CKD EPI equation. This calculation has not been validated in all clinical situations. eGFR's persistently <60 mL/min signify possible Chronic Kidney Disease.    Anion  gap 14 5 - 15  CBC     Status: Abnormal   Collection Time: 09/11/15  5:57 AM  Result Value Ref Range   WBC 10.9 (H) 4.0 - 10.5 K/uL   RBC 5.03 3.87 - 5.11 MIL/uL   Hemoglobin 15.0 12.0 - 15.0 g/dL   HCT 44.8 36.0 - 46.0 %   MCV 89.1 78.0 - 100.0 fL   MCH 29.8 26.0 - 34.0 pg   MCHC 33.5 30.0 - 36.0 g/dL   RDW 13.0 11.5 - 15.5 %   Platelets 200 150 - 400 K/uL  I-stat troponin, ED (not at [facility])     Status: None   Collection Time: 09/11/15  6:05 AM  Result Value Ref Range   Troponin i, poc 0.00 0.00 - 0.08 ng/mL   Comment 3            Comment: Due to the release kinetics of cTnI, a negative result within the first hours of the onset of symptoms does not rule  out myocardial infarction with certainty. If myocardial infarction is still suspected, repeat the test at appropriate intervals.   I-Stat CG4 Lactic Acid, ED     Status: None   Collection Time: 09/11/15  6:38 AM  Result Value Ref Range   Lactic Acid, Venous 1.95 0.5 - 2.0 mmol/L  Troponin I     Status: None   Collection Time: 09/11/15 12:48 PM  Result Value Ref Range   Troponin I <0.03 <0.031 ng/mL    Comment:        NO INDICATION OF MYOCARDIAL INJURY.   Lipid panel     Status: Abnormal   Collection Time: 09/11/15 12:48 PM  Result Value Ref Range   Cholesterol 180 0 - 200 mg/dL   Triglycerides 84 <150 mg/dL   HDL 39 (L) >40 mg/dL   Total CHOL/HDL Ratio 4.6 RATIO   VLDL 17 0 - 40 mg/dL   LDL Cholesterol 124 (H) 0 - 99 mg/dL    Comment:        Total Cholesterol/HDL:CHD Risk Coronary Heart Disease Risk Table                     Men   Women  1/2 Average Risk   3.4   3.3  Average Risk       5.0   4.4  2 X Average Risk   9.6   7.1  3 X Average Risk  23.4   11.0        Use the calculated Patient Ratio above and the CHD Risk Table to determine the patient's CHD Risk.        ATP III CLASSIFICATION (LDL):  <100     mg/dL   Optimal  100-129  mg/dL   Near or Above                    Optimal  130-159  mg/dL   Borderline  160-189  mg/dL   High  >190     mg/dL   Very High   Glucose, capillary     Status: Abnormal   Collection Time: 09/11/15 12:57 PM  Result Value Ref Range   Glucose-Capillary 136 (H) 65 - 99 mg/dL  Glucose, capillary     Status: Abnormal   Collection Time: 09/11/15  4:23 PM  Result Value Ref Range   Glucose-Capillary 155 (H) 65 - 99 mg/dL    Dg Chest 2 View  09/11/2015  CLINICAL DATA:  65 year old female with acute chest pain for 2 days. EXAM: CHEST  2 VIEW COMPARISON:  08/24/2013 and prior chest radiographs FINDINGS: The cardiomediastinal silhouette is unremarkable. Right hemidiaphragm elevation is unchanged. Mild chronic peribronchial thickening again  noted. There is no evidence of focal airspace disease, pulmonary edema, suspicious pulmonary nodule/mass, pleural effusion, or pneumothorax. No acute bony abnormalities are identified. IMPRESSION: No evidence of acute cardiopulmonary disease. Mild chronic peribronchial thickening. Electronically Signed   By: Margarette Canada M.D.   On: 09/11/2015 07:10   US Abdomen Limited  09/11/2015  CLINICAL DATA:  65 year old female with acute abdominal pain for 2 days. EXAM: US ABDOMEN LIMITED - RIGHT UPPER QUADRANT COMPARISON:  12/01/2009 CT FINDINGS: Gallbladder: Gallstones are identified, with the largest measuring 2.3 cm at the neck. Gallbladder wall thickening and small amount of pericholecystic fluid noted. Common bile duct: Diameter: 9 mm. There is no evidence of intrahepatic biliary dilatation. No definite choledocholithiasis are identified within the visualized CBD Liver: Increased echogenicity of the liver is identified without focal abnormality. IMPRESSION: Cholelithiasis with gallbladder wall thickening and pericholecystic fluid highly suspicious for acute cholecystitis. 2.3 cm gallstone at the neck. Mild CBD dilatation. These results were discussed with Loree Fee, RN on 09/11/2015 at 3:30 p.m. Electronically Signed   By: Margarette Canada M.D.   On: 09/11/2015 15:32    Review of Systems  Constitutional: Negative.   HENT: Negative.   Respiratory: Negative.   Cardiovascular: Negative.   Gastrointestinal: Positive for nausea and abdominal pain. Negative for vomiting.  Musculoskeletal: Negative.   Skin: Negative.   Neurological: Negative.    Blood pressure 171/60, pulse 62, temperature 98.8 F (37.1 C), temperature source Oral, resp. rate 20, height 5' 7"  (1.702 m), weight 98.113 kg (216 lb 4.8 oz), SpO2 99 %. Physical Exam  Constitutional: She is oriented to person, place, and time. She appears well-developed and well-nourished.  HENT:  Head: Normocephalic and atraumatic.  Eyes: Conjunctivae and EOM are  normal. Pupils are equal, round, and reactive to light.  Neck: Normal range of motion. Neck supple.  Cardiovascular: Normal rate, regular rhythm and normal heart sounds.   Respiratory: Effort normal and breath sounds normal.  GI: Soft. Bowel sounds are normal. She exhibits no distension. There is tenderness (RUQ/epigastrum). There is no rebound and no guarding.  Musculoskeletal: Normal range of motion.  Neurological: She is alert and oriented to person, place, and time.  Skin: Skin is warm and dry.    Assessment/Plan: 65 year old female with acute cholecystitis Principal Problem:   Acute calculous cholecystitis Active Problems:   Chest pain   HTN (hypertension)   Nausea and vomiting   Fibromyalgia   T2DM (type 2 diabetes mellitus) (HCC)   GERD (gastroesophageal reflux disease)   History of tobacco abuse   History of repair of hiatal hernia   Hyperlipidemia  1. The patient will be scheduled for lap Cholecystectomy w/ IOC by Dr. Brantley Stage.  I will discuss the patient with him. 2.All risks and benefits were discussed with the patient to generally include: infection, bleeding, possible need for post op ERCP, damage to the bile ducts, and bile leak. Alternatives were offered and described.  All questions were answered and the patient voiced understanding of the procedure and wishes to proceed at this point with a laparoscopic cholecystectomy    Rosario Jacks., [facility] 09/11/2015, 4:50 PM

## 2015-09-12 ENCOUNTER — Observation Stay (HOSPITAL_COMMUNITY): Payer: Medicare Other | Admitting: Anesthesiology

## 2015-09-12 ENCOUNTER — Encounter (HOSPITAL_COMMUNITY): Payer: Self-pay | Admitting: Anesthesiology

## 2015-09-12 ENCOUNTER — Encounter (HOSPITAL_COMMUNITY)
Admission: EM | Disposition: A | Discharge status: Home / Self Care | Payer: Self-pay | Source: Home / Self Care | Attending: Internal Medicine

## 2015-09-12 ENCOUNTER — Observation Stay (HOSPITAL_COMMUNITY): Payer: Medicare Other

## 2015-09-12 DIAGNOSIS — K810 Acute cholecystitis: Secondary | ICD-10-CM | POA: Diagnosis present

## 2015-09-12 DIAGNOSIS — Z885 Allergy status to narcotic agent status: Secondary | ICD-10-CM | POA: Diagnosis not present

## 2015-09-12 DIAGNOSIS — E119 Type 2 diabetes mellitus without complications: Secondary | ICD-10-CM | POA: Diagnosis present

## 2015-09-12 DIAGNOSIS — I10 Essential (primary) hypertension: Secondary | ICD-10-CM

## 2015-09-12 DIAGNOSIS — L298 Other pruritus: Secondary | ICD-10-CM | POA: Diagnosis not present

## 2015-09-12 DIAGNOSIS — K8000 Calculus of gallbladder with acute cholecystitis without obstruction: Secondary | ICD-10-CM | POA: Diagnosis not present

## 2015-09-12 DIAGNOSIS — F419 Anxiety disorder, unspecified: Secondary | ICD-10-CM | POA: Diagnosis present

## 2015-09-12 DIAGNOSIS — Z9049 Acquired absence of other specified parts of digestive tract: Secondary | ICD-10-CM | POA: Diagnosis not present

## 2015-09-12 DIAGNOSIS — L299 Pruritus, unspecified: Secondary | ICD-10-CM | POA: Diagnosis not present

## 2015-09-12 DIAGNOSIS — M797 Fibromyalgia: Secondary | ICD-10-CM

## 2015-09-12 DIAGNOSIS — J439 Emphysema, unspecified: Secondary | ICD-10-CM | POA: Diagnosis present

## 2015-09-12 DIAGNOSIS — E785 Hyperlipidemia, unspecified: Secondary | ICD-10-CM | POA: Diagnosis present

## 2015-09-12 DIAGNOSIS — Z888 Allergy status to other drugs, medicaments and biological substances status: Secondary | ICD-10-CM | POA: Diagnosis not present

## 2015-09-12 DIAGNOSIS — K8042 Calculus of bile duct with acute cholecystitis without obstruction: Secondary | ICD-10-CM | POA: Diagnosis not present

## 2015-09-12 DIAGNOSIS — Z8249 Family history of ischemic heart disease and other diseases of the circulatory system: Secondary | ICD-10-CM | POA: Diagnosis not present

## 2015-09-12 DIAGNOSIS — K219 Gastro-esophageal reflux disease without esophagitis: Secondary | ICD-10-CM | POA: Diagnosis present

## 2015-09-12 DIAGNOSIS — R0789 Other chest pain: Secondary | ICD-10-CM | POA: Diagnosis not present

## 2015-09-12 DIAGNOSIS — K8062 Calculus of gallbladder and bile duct with acute cholecystitis without obstruction: Secondary | ICD-10-CM | POA: Diagnosis present

## 2015-09-12 DIAGNOSIS — R079 Chest pain, unspecified: Secondary | ICD-10-CM | POA: Diagnosis present

## 2015-09-12 DIAGNOSIS — D509 Iron deficiency anemia, unspecified: Secondary | ICD-10-CM

## 2015-09-12 DIAGNOSIS — Z87891 Personal history of nicotine dependence: Secondary | ICD-10-CM | POA: Diagnosis not present

## 2015-09-12 DIAGNOSIS — Z79899 Other long term (current) drug therapy: Secondary | ICD-10-CM | POA: Diagnosis not present

## 2015-09-12 HISTORY — PX: ERCP: SHX5425

## 2015-09-12 LAB — HEMOGLOBIN A1C
HEMOGLOBIN A1C: 5.6 % (ref 4.8–5.6)
MEAN PLASMA GLUCOSE: 114 mg/dL

## 2015-09-12 LAB — COMPREHENSIVE METABOLIC PANEL
ALT: 311 U/L — AB (ref 14–54)
AST: 244 U/L — AB (ref 15–41)
Albumin: 3.3 g/dL — ABNORMAL LOW (ref 3.5–5.0)
Alkaline Phosphatase: 231 U/L — ABNORMAL HIGH (ref 38–126)
Anion gap: 13 (ref 5–15)
BUN: 7 mg/dL (ref 6–20)
CO2: 21 mmol/L — ABNORMAL LOW (ref 22–32)
Calcium: 8.9 mg/dL (ref 8.9–10.3)
Chloride: 105 mmol/L (ref 101–111)
Creatinine, Ser: 0.77 mg/dL (ref 0.44–1.00)
GFR calc Af Amer: >60 mL/min (ref >60)
GFR calc non Af Amer: >60 mL/min (ref >60)
Glucose, Bld: 118 mg/dL — ABNORMAL HIGH (ref 65–99)
Potassium: 3.8 mmol/L (ref 3.5–5.1)
SODIUM: 139 mmol/L (ref 135–145)
TOTAL PROTEIN: 6.6 g/dL (ref 6.5–8.1)
Total Bilirubin: 7.1 mg/dL — ABNORMAL HIGH (ref 0.3–1.2)

## 2015-09-12 LAB — CBC
HEMATOCRIT: 40.4 % (ref 36.0–46.0)
Hemoglobin: 13.4 g/dL (ref 12.0–15.0)
MCH: 29.7 pg (ref 26.0–34.0)
MCHC: 33.2 g/dL (ref 30.0–36.0)
MCV: 89.6 fL (ref 78.0–100.0)
PLATELETS: 181 10*3/uL (ref 150–400)
RBC: 4.51 MIL/uL (ref 3.87–5.11)
RDW: 13.7 % (ref 11.5–15.5)
WBC: 11.9 10*3/uL — AB (ref 4.0–10.5)

## 2015-09-12 LAB — HIV ANTIBODY (ROUTINE TESTING W REFLEX): HIV SCREEN 4TH GENERATION: NONREACTIVE

## 2015-09-12 LAB — GLUCOSE, CAPILLARY
GLUCOSE-CAPILLARY: 97 mg/dL (ref 65–99)
GLUCOSE-CAPILLARY: 118 mg/dL — AB (ref 65–99)
GLUCOSE-CAPILLARY: 132 mg/dL — AB (ref 65–99)
Glucose-Capillary: 102 mg/dL — ABNORMAL HIGH (ref 65–99)

## 2015-09-12 SURGERY — ERCP, WITH INTERVENTION IF INDICATED
Anesthesia: General

## 2015-09-12 MED ORDER — PROPOFOL 10 MG/ML IV BOLUS
INTRAVENOUS | Status: DC | PRN
  Administered 2015-09-12: 200 mg via INTRAVENOUS

## 2015-09-12 MED ORDER — LACTATED RINGERS IV SOLN: INTRAVENOUS | Status: DC | PRN

## 2015-09-12 MED ORDER — SUCCINYLCHOLINE CHLORIDE 20 MG/ML IJ SOLN
INTRAMUSCULAR | Status: DC | PRN
  Administered 2015-09-12: 40 mg via INTRAVENOUS
  Administered 2015-09-12: 80 mg via INTRAVENOUS

## 2015-09-12 MED ORDER — ONDANSETRON HCL 4 MG/2ML IJ SOLN
INTRAMUSCULAR | Status: DC | PRN
  Administered 2015-09-12: 4 mg via INTRAVENOUS

## 2015-09-12 MED ORDER — LIDOCAINE HCL 4 % EX SOLN
CUTANEOUS | Status: DC | PRN
  Administered 2015-09-12: 3 mL via TOPICAL

## 2015-09-12 MED ORDER — LACTATED RINGERS IV SOLN
INTRAVENOUS | Status: DC
  Administered 2015-09-12: 1000 mL via INTRAVENOUS

## 2015-09-12 MED ORDER — ARTIFICIAL TEARS OP OINT
TOPICAL_OINTMENT | OPHTHALMIC | Status: DC | PRN
Start: 2015-09-12 — End: 2015-09-12
  Administered 2015-09-12: 1 via OPHTHALMIC

## 2015-09-12 MED ORDER — FENTANYL CITRATE (PF) 100 MCG/2ML IJ SOLN
INTRAMUSCULAR | Status: DC | PRN
  Administered 2015-09-12 (×2): 25 ug via INTRAVENOUS

## 2015-09-12 MED ORDER — LACTATED RINGERS IV SOLN
INTRAVENOUS | Status: DC | PRN
  Administered 2015-09-12: 13:00:00 via INTRAVENOUS

## 2015-09-12 MED ORDER — LIDOCAINE HCL (CARDIAC) 20 MG/ML IV SOLN
INTRAVENOUS | Status: DC | PRN
  Administered 2015-09-12: 60 mg via INTRAVENOUS

## 2015-09-12 MED ORDER — ALBUTEROL SULFATE (2.5 MG/3ML) 0.083% IN NEBU
INHALATION_SOLUTION | RESPIRATORY_TRACT | Status: AC
  Filled 2015-09-12: qty 3

## 2015-09-12 MED ORDER — SODIUM CHLORIDE 0.9 % IV SOLN
INTRAVENOUS | Status: DC | PRN
  Administered 2015-09-12: 30 mL

## 2015-09-12 MED ORDER — SODIUM CHLORIDE 0.9 % IV SOLN: INTRAVENOUS | Status: DC

## 2015-09-12 NOTE — Consult Note (Signed)
CARDIOLOGY CONSULT NOTE       Patient ID: Tamara Chambers MRN: [id number] DOB/AGE: 1951/05/25 65 y.o.  Admit date: 09/11/2015 Referring Physician:  Josem Kaufmann Primary Physician: Viann Shove, MD Primary Cardiologist:  Jacinto Halim  Reason for Consultation:  Pre op Clearance Chest Pain   Principal Problem:   Acute calculous cholecystitis Active Problems:   Chest pain   HTN (hypertension)   Nausea and vomiting   Fibromyalgia   T2DM (type 2 diabetes mellitus) (HCC)   GERD (gastroesophageal reflux disease)   History of tobacco abuse   History of repair of hiatal hernia   Hyperlipidemia   HPI:  65 y.o. admitted with epigastric pain after eating.  She has previously been seen by Dr Jacinto Halim.  Had normal cath in 2008 oncluding no RAS and normal LV gram.  Hospital w/u som far consistant with cholecystitis  Pain related to food.  Not exertional.  Not in chest This am localizes more to RUQ.  [place] with stones and dilated duct  WBC elevated LFT;s up and may need ERCP before surgery CRF;s  HTN and DM.  Currently only with some pain to palpation of RUQ  On antibiotics.  .    ROS All other systems reviewed and negative except as noted above  Past Medical History  Diagnosis Date  . Anemia   . Anxiety   . Arthritis   . Diabetes mellitus   . Emphysema of lung (HCC)   . GERD (gastroesophageal reflux disease)   . Hyperlipidemia   . Hypertension   . Neuromuscular disorder ([facility])   . Seizures ([facility])   . Thyroid disease     Family History  Problem Relation Age of Onset  . Cancer Mother     breast  . Cirrhosis Father   . Heart failure Mother     Social History   Social History  . Marital Status: Divorced    Spouse Name: N/A  . Number of Children: N/A  . Years of Education: N/A   Occupational History  . Not on file.   Social History Main Topics  . Smoking status: Former Smoker    Quit date: 04/26/2004  . Smokeless tobacco: Never Used  . Alcohol Use: No  . Drug Use: No  .  Sexual Activity: Not on file   Other Topics Concern  . Not on file   Social History Narrative    Past Surgical History  Procedure Laterality Date  . Tonsilectomy, adenoidectomy, bilateral myringotomy and tubes  1968  . Cesarean section  1984  . Abdominal hysterectomy  1992    partial  . Oophorectomy  2004  . Femur distal locking screw insertion  2004    left foot  . Laparoscopic nissen fundoplication  05/15/11    lap nissen and niatal hernia      . aspirin EC  81 mg Oral Daily  . cefTRIAXone (ROCEPHIN)  IV  2 g Intravenous Q24H  . gi cocktail  30 mL Oral Once  . heparin subcutaneous  5,000 Units Subcutaneous 3 times per day  . insulin aspart  0-5 Units Subcutaneous QHS  . insulin aspart  0-9 Units Subcutaneous TID WC  . lisinopril  20 mg Oral Daily  . pantoprazole (PROTONIX) IV  40 mg Intravenous Q12H  . sodium chloride flush  3 mL Intravenous Q12H   . 0.9 % NaCl with KCl 20 mEq / L 100 mL/hr at 09/12/15 0500    Physical Exam: Blood pressure 138/68, pulse 69, temperature 98.7  F (37.1 C), temperature source Oral, resp. rate 20, height 5\' 7"  (1.702 m), weight 96.798 kg (213 lb 6.4 oz), SpO2 98 %.   Affect appropriate Healthy:  appears stated age HEENT: normal Neck supple with no adenopathy JVP normal no bruits no thyromegaly Lungs clear with no wheezing and good diaphragmatic motion Heart:  S1/S2 no murmur, no rub, gallop or click PMI normal Abdomen:  RUQ pain no rebound no distension  no bruit.  No HSM or HJR Distal pulses intact with no bruits No edema Neuro non-focal Skin warm and dry No muscular weakness   Labs:   Lab Results  Component Value Date   WBC 11.9* 09/12/2015   HGB 13.4 09/12/2015   HCT 40.4 09/12/2015   MCV 89.6 09/12/2015   PLT 181 09/12/2015    Recent Labs Lab 09/12/15 0357  NA 139  K 3.8  CL 105  CO2 21*  BUN 7  CREATININE 0.77  CALCIUM 8.9  PROT 6.6  BILITOT 7.1*  ALKPHOS 231*  ALT 311*  AST 244*  GLUCOSE 118*   Lab  Results  Component Value Date   CKTOTAL 77 04/08/2011   CKMB 2.1 04/08/2011   TROPONINI <0.03 09/11/2015    Lab Results  Component Value Date   CHOL 180 09/11/2015   CHOL 232* 06/06/2010   CHOL 180 11/15/2009   Lab Results  Component Value Date   HDL 39* 09/11/2015   HDL 39* 11/15/2009   HDL 42 06/17/2009   Lab Results  Component Value Date   LDLCALC 124* 09/11/2015   LDLCALC 102* 11/15/2009   LDLCALC 89 06/17/2009   Lab Results  Component Value Date   TRIG 84 09/11/2015   TRIG 195* 11/15/2009   TRIG 296* 06/17/2009   Lab Results  Component Value Date   CHOLHDL 4.6 09/11/2015   CHOLHDL 4.6 Ratio 11/15/2009   CHOLHDL 4.5 Ratio 06/17/2009   No results found for: LDLDIRECT    Radiology: Dg Chest 2 View  09/11/2015  CLINICAL DATA:  65 year old female with acute chest pain for 2 days. EXAM: CHEST  2 VIEW COMPARISON:  08/24/2013 and prior chest radiographs FINDINGS: The cardiomediastinal silhouette is unremarkable. Right hemidiaphragm elevation is unchanged. Mild chronic peribronchial thickening again noted. There is no evidence of focal airspace disease, pulmonary edema, suspicious pulmonary nodule/mass, pleural effusion, or pneumothorax. No acute bony abnormalities are identified. IMPRESSION: No evidence of acute cardiopulmonary disease. Mild chronic peribronchial thickening. Electronically Signed   By: Harmon Pier M.D.   On: 09/11/2015 07:10   US Abdomen Limited  09/11/2015  CLINICAL DATA:  65 year old female with acute abdominal pain for 2 days. EXAM: US ABDOMEN LIMITED - RIGHT UPPER QUADRANT COMPARISON:  12/01/2009 CT FINDINGS: Gallbladder: Gallstones are identified, with the largest measuring 2.3 cm at the neck. Gallbladder wall thickening and small amount of pericholecystic fluid noted. Common bile duct: Diameter: 9 mm. There is no evidence of intrahepatic biliary dilatation. No definite choledocholithiasis are identified within the visualized CBD Liver: Increased  echogenicity of the liver is identified without focal abnormality. IMPRESSION: Cholelithiasis with gallbladder wall thickening and pericholecystic fluid highly suspicious for acute cholecystitis. 2.3 cm gallstone at the neck. Mild CBD dilatation. These results were discussed with Alphonzo Lemmings, RN on 09/11/2015 at 3:30 p.m. Electronically Signed   By: Harmon Pier M.D.   On: 09/11/2015 15:32    EKG: SR rate 57 normal ECG    ASSESSMENT AND PLAN:  Preop/Chest Pain:  Non cardiac pain Clinically pain from GB disease.  WBC elevated, LFTls up, [place] positive and pain to palpation RUQ on exam.  No history of CAD with normal cath In 2008 and negative troponin and normal ECG  Clear to have ERCP and lap choly  GB:  On antibiotics and NPO  GI to see for ? ERCP then lap choly    HTN  Stable continue ACE  DM:   SS insulin check A1c per primary team   Signed: Charlton Haws 09/12/2015, 7:48 AM

## 2015-09-12 NOTE — Anesthesia Procedure Notes (Signed)
Procedure Name: Intubation Date/Time: 09/12/2015 1:28 PM Performed by: Suzy Bouchard Pre-anesthesia Checklist: Patient identified, Timeout performed, Emergency Drugs available, Suction available and Patient being monitored Patient Re-evaluated:Patient Re-evaluated prior to inductionOxygen Delivery Method: Circle system utilized Preoxygenation: Pre-oxygenation with 100% oxygen Intubation Type: IV induction Ventilation: Mask ventilation without difficulty Laryngoscope Size: Miller and 2 Grade View: Grade I Tube type: Oral Tube size: 7.0 mm Number of attempts: 2 (cords closed after LTA, re dosed Sux and ventilated, cords open and clear on second DL) Airway Equipment and Method: Stylet and LTA kit utilized Placement Confirmation: ETT inserted through vocal cords under direct vision,  breath sounds checked- equal and bilateral and positive ETCO2 Secured at: 22 cm Tube secured with: Tape Dental Injury: Teeth and Oropharynx as per pre-operative assessment

## 2015-09-12 NOTE — Care Management Note (Addendum)
Case Management Note  Patient Details  Name: Tamara Chambers MRN: [id number] Date of Birth: [birth year]  Subjective/Objective:     Nausea, vomiting, gallstones, ERCP 09/12/2015               Action/Plan: NCM spoke to pt and states she lives at home alone. She drives to her appts. She has no difficulty with paying for medications. Mother, Maryagnes Amos # [phone] will assist her as needed.   PCP-Dr Windle Guard  Expected Discharge Date:  09/15/2015               Expected Discharge Plan:  Home/Self Care  In-House Referral:  NA  Discharge planning Services  CM Consult  Post Acute Care Choice:  NA Choice offered to:  NA  DME Arranged:  N/A DME Agency:  NA  HH Arranged:  NA HH Agency:  NA  Status of Service:  Completed, signed off  Medicare Important Message Given:    Date Medicare IM Given:    Medicare IM give by:    Date Additional Medicare IM Given:    Additional Medicare Important Message give by:     If discussed at Long Length of Stay Meetings, dates discussed:    Additional Comments:  Elliot Cousin, RN 09/12/2015, 11:52 AM

## 2015-09-12 NOTE — H&P (Signed)
Internal Medicine Attending Admission Note Date: 09/12/2015  Patient name: Tamara Chambers Medical record number: [id number] Date of birth: [birth year] Age: 65 y.o. Gender: female  I saw and evaluated the patient. I reviewed the resident's note and I agree with the resident's findings and plan as documented in the resident's note.  Chief Complaint(s): Epigastric pain.  History - key components related to admission:  Tamara Chambers presents to the emergency department with a 2 day history of epigastric pain. This occurred after eating a tuna sandwich. She was found to have a transaminitis with an elevated bilirubin to 7 and alk phos in the 200's and an ultrasound consistent with bile duct dilatation, cholelithiasis, and cholecystitis. She was therefore admitted to the internal medicine teaching service for further evaluation and care.  Because of the above there was a concern that she had choledocholithiasis and surgery had recommended an ERCP preoperatively. This was performed today by GI in which they found a filling defect within the bile duct which disappeared with balloon sweeping via ERCP. The current plan is to proceed to laparoscopic cholecystectomy. When Tamara Chambers was seen on rounds this morning before the ERCP she was feeling well without any abdominal pain.  Physical Exam - key components related to admission:  Filed Vitals:   09/12/15 1435 09/12/15 1440 09/12/15 1450 09/12/15 1938  BP: 135/61 159/44 150/70 145/78  Pulse: 89 101 97 100  Temp:    98.4 F (36.9 C)  TempSrc:    Oral  Resp: 24 24 17    Height:      Weight:      SpO2: 96% 100% 99% 95%   This examination took place this morning at approximately 9 AM. Gen.: Well-developed, well-nourished, woman lying comfortably in bed in no acute distress. Abdomen: Soft, nontender, active bowel sounds. There was no guarding or rebound.  Lab results:  Basic Metabolic Panel:  Recent Labs  09/11/15 0557 09/12/15 0357  NA 140  139  K 3.5 3.8  CL 101 105  CO2 25 21*  GLUCOSE 169* 118*  BUN 9 7  CREATININE 0.79 0.77  CALCIUM 9.6 8.9   Liver Function Tests:  Recent Labs  09/11/15 1608 09/12/15 0357  AST 350* 244*  ALT 369* 311*  ALKPHOS 224* 231*  BILITOT 7.4* 7.1*  PROT 7.5 6.6  ALBUMIN 3.8 3.3*   CBC:  Recent Labs  09/11/15 0557 09/12/15 0357  WBC 10.9* 11.9*  HGB 15.0 13.4  HCT 44.8 40.4  MCV 89.1 89.6  PLT 200 181   Cardiac Enzymes:  Recent Labs  09/11/15 1248  TROPONINI <0.03   CBG:  Recent Labs  09/11/15 1257 09/11/15 1623 09/11/15 2140 09/12/15 0741 09/12/15 1142 09/12/15 1701  GLUCAP 136* 155* 97 102* 97 118*   Hemoglobin A1C:  Recent Labs  09/11/15 1248  HGBA1C 5.6   Fasting Lipid Panel:  Recent Labs  09/11/15 1248  CHOL 180  HDL 39*  LDLCALC 124*  TRIG 84  CHOLHDL 4.6   Misc. Labs:  Lactic acid 1.95 HIV antibody nonreactive Blood cultures 2 no growth to date  Imaging results:  Dg Chest 2 View  09/11/2015  CLINICAL DATA:  65 year old female with acute chest pain for 2 days. EXAM: CHEST  2 VIEW COMPARISON:  08/24/2013 and prior chest radiographs FINDINGS: The cardiomediastinal silhouette is unremarkable. Right hemidiaphragm elevation is unchanged. Mild chronic peribronchial thickening again noted. There is no evidence of focal airspace disease, pulmonary edema, suspicious pulmonary nodule/mass, pleural effusion, or pneumothorax. No acute  bony abnormalities are identified. IMPRESSION: No evidence of acute cardiopulmonary disease. Mild chronic peribronchial thickening. Electronically Signed   By: Margarette Canada M.D.   On: 09/11/2015 07:10   US Abdomen Limited  09/11/2015  CLINICAL DATA:  65 year old female with acute abdominal pain for 2 days. EXAM: US ABDOMEN LIMITED - RIGHT UPPER QUADRANT COMPARISON:  12/01/2009 CT FINDINGS: Gallbladder: Gallstones are identified, with the largest measuring 2.3 cm at the neck. Gallbladder wall thickening and small amount  of pericholecystic fluid noted. Common bile duct: Diameter: 9 mm. There is no evidence of intrahepatic biliary dilatation. No definite choledocholithiasis are identified within the visualized CBD Liver: Increased echogenicity of the liver is identified without focal abnormality. IMPRESSION: Cholelithiasis with gallbladder wall thickening and pericholecystic fluid highly suspicious for acute cholecystitis. 2.3 cm gallstone at the neck. Mild CBD dilatation. These results were discussed with Loree Fee, RN on 09/11/2015 at 3:30 p.m. Electronically Signed   By: Margarette Canada M.D.   On: 09/11/2015 15:32   Dg Ercp Biliary & Pancreatic Ducts  09/12/2015  CLINICAL DATA:  65 year old female with a history of choledocholithiasis EXAM: ERCP TECHNIQUE: Multiple spot images obtained with the fluoroscopic device and submitted for interpretation post-procedure. FLUOROSCOPY TIME:  Fluoroscopy Time:  1 minutes 32 seconds COMPARISON:  Ultrasound 09/11/2015 FINDINGS: Initial image demonstrates endoscope projecting over the upper abdomen with cannulation of the ampulla and retrograde infusion of contrast into the extrahepatic biliary system. Final image demonstrates contrast either within duodenum are adjacent to the biliary system. A guidewire remains within the extrahepatic biliary system on the final image. No contrast visualized to cross the ampulla on these limited images. No large filling defects identified. IMPRESSION: Limited images during ERCP demonstrates partial opacification of the extrahepatic biliary system, with no filling defects of the common bile duct. Note that contrast is not visualized to cross the ampulla. Please refer to the dictated operative report for full details of intraoperative findings and procedure. Signed, Dulcy Fanny. Earleen Newport, DO [facility] Electronically Signed   By: Corrie Mckusick D.O.   On: 09/12/2015 14:56   Other results:  EKG: Normal sinus  bradycardia at 57 bpm, normal axis, normal intervals, no significant Q waves, no LVH by voltage, early R wave progression, no ST segment or T-wave changes. The ECG is unchanged from the previous ECG on 04/08/2011.  Assessment & Plan by Problem:  Ms. Schweers is a 65 year old woman with a history of hypertension, hyperlipidemia, gastroesophageal reflux disease, major depression, and fibromyalgia who presents with acute epigastric pain after eating a tuna sandwich. She was found to have a transaminitis with an elevated bilirubin and alkaline phosphatase. Ultrasound demonstrated a dilated bile duct with cholelithiasis and cholecystitis. ERCP confirmed a filling defect consistent with choledocholithiasis. She is currently awaiting a laparoscopic cholecystectomy.  1) Acute calculus cholecystitis with choledocholithiasis: Status post ERCP with clearing of the bile duct. We appreciate GI's assistance with the ERCP today and surgery's input and plan laparoscopic cholecystectomy. We will continue current supportive care pending the scheduling of the laparoscopic cholecystectomy.  2) Disposition: She will be discharged home once she recovers appropriately from the laproscopic cholecystectomy.

## 2015-09-12 NOTE — Progress Notes (Signed)
Subjective:  No acute events, says pain is completely gone. Explained the rationale for consulting GI as surgery recommended getting ERCP done beforehand and pt was amenable to that Says GI cocktail helped a lot   She is currently in ERCP now   Objective: Vital signs in last 24 hours: Filed Vitals:   09/11/15 1242 09/11/15 2149 09/12/15 0523 09/12/15 1216  BP: 171/60 141/62 138/68 160/62  Pulse: 62 90 69 79  Temp: 98.8 F (37.1 C) 98.2 F (36.8 C) 98.7 F (37.1 C) 99.4 F (37.4 C)  TempSrc: Oral Oral Oral Oral  Resp: 20   19  Height: 5' 7"  (1.702 m)     Weight: 216 lb 4.8 oz (98.113 kg)  213 lb 6.4 oz (96.798 kg)   SpO2: 99% 98% 98% 94%   Weight change: 16 lb 4.8 oz (7.394 kg)  Intake/Output Summary (Last 24 hours) at 09/12/15 1324 Last data filed at 09/11/15 1800  Gross per 24 hour  Intake 201.67 ml  Output      0 ml  Net 201.67 ml   General: Vital signs reviewed. Patient in no acute distress Cardiovascular: regular rate, rhythm, no murmur appreciated  Pulmonary/Chest: Clear to auscultation bilaterally, no wheezing or crackles Abdominal: Soft, nontender, normal BS, nondistended, no epigastric pain  Extremities: No lower extremity edema bilaterally, .   Lab Results: Results for orders placed or performed during the hospital encounter of 09/11/15 (from the past 24 hour(s))  Hepatic function panel     Status: Abnormal   Collection Time: 09/11/15  4:08 PM  Result Value Ref Range   Total Protein 7.5 6.5 - 8.1 g/dL   Albumin 3.8 3.5 - 5.0 g/dL   AST 350 (H) 15 - 41 U/L   ALT 369 (H) 14 - 54 U/L   Alkaline Phosphatase 224 (H) 38 - 126 U/L   Total Bilirubin 7.4 (H) 0.3 - 1.2 mg/dL   Bilirubin, Direct 4.6 (H) 0.1 - 0.5 mg/dL   Indirect Bilirubin 2.8 (H) 0.3 - 0.9 mg/dL  MRSA PCR Screening     Status: None   Collection Time: 09/11/15  4:23 PM  Result Value Ref Range   MRSA by PCR NEGATIVE NEGATIVE  Glucose, capillary     Status: Abnormal   Collection Time:  09/11/15  4:23 PM  Result Value Ref Range   Glucose-Capillary 155 (H) 65 - 99 mg/dL  Glucose, capillary     Status: None   Collection Time: 09/11/15  9:40 PM  Result Value Ref Range   Glucose-Capillary 97 65 - 99 mg/dL  Comprehensive metabolic panel     Status: Abnormal   Collection Time: 09/12/15  3:57 AM  Result Value Ref Range   Sodium 139 135 - 145 mmol/L   Potassium 3.8 3.5 - 5.1 mmol/L   Chloride 105 101 - 111 mmol/L   CO2 21 (L) 22 - 32 mmol/L   Glucose, Bld 118 (H) 65 - 99 mg/dL   BUN 7 6 - 20 mg/dL   Creatinine, Ser 0.77 0.44 - 1.00 mg/dL   Calcium 8.9 8.9 - 10.3 mg/dL   Total Protein 6.6 6.5 - 8.1 g/dL   Albumin 3.3 (L) 3.5 - 5.0 g/dL   AST 244 (H) 15 - 41 U/L   ALT 311 (H) 14 - 54 U/L   Alkaline Phosphatase 231 (H) 38 - 126 U/L   Total Bilirubin 7.1 (H) 0.3 - 1.2 mg/dL   GFR calc non Af Amer >60 >60 mL/min  GFR calc Af Amer >60 >60 mL/min   Anion gap 13 5 - 15  CBC     Status: Abnormal   Collection Time: 09/12/15  3:57 AM  Result Value Ref Range   WBC 11.9 (H) 4.0 - 10.5 K/uL   RBC 4.51 3.87 - 5.11 MIL/uL   Hemoglobin 13.4 12.0 - 15.0 g/dL   HCT 40.4 36.0 - 46.0 %   MCV 89.6 78.0 - 100.0 fL   MCH 29.7 26.0 - 34.0 pg   MCHC 33.2 30.0 - 36.0 g/dL   RDW 13.7 11.5 - 15.5 %   Platelets 181 150 - 400 K/uL  Glucose, capillary     Status: Abnormal   Collection Time: 09/12/15  7:41 AM  Result Value Ref Range   Glucose-Capillary 102 (H) 65 - 99 mg/dL   Comment 1 Notify RN    Comment 2 Document in Chart   Glucose, capillary     Status: None   Collection Time: 09/12/15 11:42 AM  Result Value Ref Range   Glucose-Capillary 97 65 - 99 mg/dL   Comment 1 Notify RN    Comment 2 Document in Chart     Micro Results: Recent Results (from the past 240 hour(s))  MRSA PCR Screening     Status: None   Collection Time: 09/11/15  4:23 PM  Result Value Ref Range Status   MRSA by PCR NEGATIVE NEGATIVE Final    Comment:        The GeneXpert MRSA Assay (FDA approved for  NASAL specimens only), is one component of a comprehensive MRSA colonization surveillance program. It is not intended to diagnose MRSA infection nor to guide or monitor treatment for MRSA infections.    Studies/Results: Dg Chest 2 View  09/11/2015  CLINICAL DATA:  65 year old female with acute chest pain for 2 days. EXAM: CHEST  2 VIEW COMPARISON:  08/24/2013 and prior chest radiographs FINDINGS: The cardiomediastinal silhouette is unremarkable. Right hemidiaphragm elevation is unchanged. Mild chronic peribronchial thickening again noted. There is no evidence of focal airspace disease, pulmonary edema, suspicious pulmonary nodule/mass, pleural effusion, or pneumothorax. No acute bony abnormalities are identified. IMPRESSION: No evidence of acute cardiopulmonary disease. Mild chronic peribronchial thickening. Electronically Signed   By: Margarette Canada M.D.   On: 09/11/2015 07:10   US Abdomen Limited  09/11/2015  CLINICAL DATA:  65 year old female with acute abdominal pain for 2 days. EXAM: US ABDOMEN LIMITED - RIGHT UPPER QUADRANT COMPARISON:  12/01/2009 CT FINDINGS: Gallbladder: Gallstones are identified, with the largest measuring 2.3 cm at the neck. Gallbladder wall thickening and small amount of pericholecystic fluid noted. Common bile duct: Diameter: 9 mm. There is no evidence of intrahepatic biliary dilatation. No definite choledocholithiasis are identified within the visualized CBD Liver: Increased echogenicity of the liver is identified without focal abnormality. IMPRESSION: Cholelithiasis with gallbladder wall thickening and pericholecystic fluid highly suspicious for acute cholecystitis. 2.3 cm gallstone at the neck. Mild CBD dilatation. These results were discussed with Loree Fee, RN on 09/11/2015 at 3:30 p.m. Electronically Signed   By: Margarette Canada M.D.   On: 09/11/2015 15:32   Medications: I have reviewed the patient's current medications. Scheduled Meds: . [MAR Hold] aspirin EC  81 mg Oral  Daily  . [MAR Hold] cefTRIAXone (ROCEPHIN)  IV  2 g Intravenous Q24H  . [MAR Hold] gi cocktail  30 mL Oral Once  . [MAR Hold] heparin subcutaneous  5,000 Units Subcutaneous 3 times per day  . [MAR Hold] insulin aspart  0-5  Units Subcutaneous QHS  . [MAR Hold] insulin aspart  0-9 Units Subcutaneous TID WC  . [MAR Hold] lisinopril  20 mg Oral Daily  . [MAR Hold] pantoprazole (PROTONIX) IV  40 mg Intravenous Q12H  . [MAR Hold] sodium chloride flush  3 mL Intravenous Q12H   Continuous Infusions: . sodium chloride    . 0.9 % NaCl with KCl 20 mEq / L 100 mL/hr at 09/12/15 0500  . lactated ringers 1,000 mL (09/12/15 1220)   PRN Meds:.[MAR Hold] acetaminophen **OR** [MAR Hold] acetaminophen, [MAR Hold] albuterol, [MAR Hold]  morphine injection, [MAR Hold] promethazine **OR** [MAR Hold] promethazine **OR** [MAR Hold] promethazine Assessment/Plan: Principal Problem:   Acute calculous cholecystitis Active Problems:   Chest pain   HTN (hypertension)   Nausea and vomiting   Fibromyalgia   T2DM (type 2 diabetes mellitus) (HCC)   GERD (gastroesophageal reflux disease)   History of tobacco abuse   History of repair of hiatal hernia   Hyperlipidemia  Acute calculous cholecystitis with gallbladder wall thickening and mild cbd dilation:  ACS has been ruled out. Surgery was consulted, who recommended consulting GI. I consulted GI, and they are doing ERCP currently due to the disproportionate high total and direct  bilirubin and abnormal liver enzymes. This morning, patient's pain much better.  The rationale for obtaining patient's chronic pain history yesterday was that at the time of the admission, her symptoms sounded away from cardiac etiology, and the diagnosis of acute cholecystitic was unknown as RUQ not performed at that time.. As the patient was on daily toradol at home, we were concerned about an ulcer.  Also, surgery consulted Cardiology for pre-op clearance, and she has been 'cleared for  surgery' . Her MET score yesterday was a 5. HIV nonreactive and LDL 124.  Blood cultures show ngtd .  -consulted GI, ERCP today -surgery following, surgery later on  -NPO -telemetry -trend CBC and CMET   -ceftriaxone per pharm -aspirin daily  -tramadol for pain, hold toradol and other nsaids -NS wth 20 meq Kcl at 100 cc/hr -GI cocktail and maalox prn -carafate -Protonix 40 mg BID IV -phenergan 25 mg q6 PRN   HTN BP mildly elevated, MAP in 80s -lisinopril 20 gm daily  HLD- lipid panel with LDL 124 . ASCVD only 8.4% if the pt was 65 years old, she is 57. Would hold off on any statin  Fibromyalgia -tramadol -hold toradol and other nsaids    Dispo: Disposition is deferred at this time, awaiting improvement of current medical problems.  Anticipated discharge in approximately 1 day(s).   The patient does have a current PCP Redmond Pulling Arna Medici, MD) and does not need an [facility] hospital follow-up appointment after discharge.  The patient does not have transportation limitations that hinder transportation to clinic appointments.  .Services Needed at time of discharge: Y = Yes, Blank = No PT:   OT:   RN:   Equipment:   Other:       Burgess Estelle, MD 09/12/2015, 1:24 PM

## 2015-09-12 NOTE — Transfer of Care (Signed)
Immediate Anesthesia Transfer of Care Note  Patient: Tamara Chambers  Procedure(s) Performed: Procedure(s): ENDOSCOPIC RETROGRADE CHOLANGIOPANCREATOGRAPHY (ERCP) (N/A)  Patient Location: Endoscopy Unit  Anesthesia Type:General  Level of Consciousness: awake, alert  and oriented  Airway & Oxygen Therapy: Patient Spontanous Breathing and Patient connected to nasal cannula oxygen  Post-op Assessment: Report given to RN, Post -op Vital signs reviewed and stable and Patient moving all extremities  Post vital signs: Reviewed and stable  Last Vitals:  Filed Vitals:   09/12/15 1409 09/12/15 1410  BP: 160/115 160/115  Pulse: 102 105  Temp:    Resp: 16 17    Complications: No apparent anesthesia complications

## 2015-09-12 NOTE — Consult Note (Signed)
[facility] Consult Note  Referring Provider: No ref. provider found Primary Care Physician:  Siy-Hian, Rod Can, MD Primary Gastroenterologist:  Dr.  Laurel Dimmer Complaint: Epigastric Pain HPI: Tamara Chambers is an 65 y.o. white female  who presents with epigastric pain nausea and vomiting with SHOWING MULTIPLE GALLSTONES WITH 9 MM COMMON BILE DUCT, TOTAL BILIRUBIN OF 7 WITH ELEVATED TRANSAMINASES. WE'RE CONSULTED FOR ERCP.  Past Medical History  Diagnosis Date  . Anemia   . Anxiety   . Arthritis   . Diabetes mellitus   . Emphysema of lung ([place])   . GERD (gastroesophageal reflux disease)   . Hyperlipidemia   . Hypertension   . Neuromuscular disorder ([place])   . Seizures ([place])   . Thyroid disease     Past Surgical History  Procedure Laterality Date  . Tonsilectomy, adenoidectomy, bilateral myringotomy and tubes  1968  . Cesarean section  1984  . Abdominal hysterectomy  1992    partial  . Oophorectomy  2004  . Femur distal locking screw insertion  2004    left foot  . Laparoscopic nissen fundoplication  80/99/83    lap nissen and niatal hernia     Medications Prior to Admission  Medication Sig Dispense Refill  . albuterol (PROVENTIL,VENTOLIN) 90 MCG/ACT inhaler Inhale 2 puffs into the lungs as needed.      Marland Kitchen ketorolac (TORADOL) 10 MG tablet Take 10 mg by mouth every 6 (six) hours as needed for moderate pain.    Marland Kitchen lisinopril (PRINIVIL,ZESTRIL) 20 MG tablet Take 20 mg by mouth daily.    . traMADol (ULTRAM) 50 MG tablet Take 50 mg by mouth every 6 (six) hours as needed for moderate pain.       Allergies:  Allergies  Allergen Reactions  . Divalproex Sodium Anaphylaxis  . Codeine Other (See Comments)    Patient cannot remember reaction at this time.  . Neurontin [Gabapentin] Other (See Comments)    Cannot remember reaction.  . Wellbutrin [Bupropion Hcl] Other (See Comments)    seizure    Family History  Problem Relation Age of Onset  . Cancer Mother      breast  . Cirrhosis Father   . Heart failure Mother     Social History:  reports that she quit smoking about 11 years ago. She has never used smokeless tobacco. She reports that she does not drink alcohol or use illicit drugs.  Review of Systems: negative except as above   Blood pressure 138/68, pulse 69, temperature 98.7 F (37.1 C), temperature source Oral, resp. rate 20, height 5' 7"  (1.702 m), weight 96.798 kg (213 lb 6.4 oz), SpO2 98 %. Head: Normocephalic, without obvious abnormality, atraumatic, sclerae icteric Neck: no adenopathy, no carotid bruit, no JVD, supple, symmetrical, trachea midline and thyroid not enlarged, symmetric, no tenderness/mass/nodules Resp: clear to auscultation bilaterally Cardio: regular rate and rhythm, S1, S2 normal, no murmur, click, rub or gallop GI: Abdomen soft nondistended with normoactive bowel sounds. No hepatosplenomegaly mass or guarding Extremities: extremities normal, atraumatic, no cyanosis or edema  Results for orders placed or performed during the hospital encounter of 09/11/15 (from the past 48 hour(s))  Basic metabolic panel     Status: Abnormal   Collection Time: 09/11/15  5:57 AM  Result Value Ref Range   Sodium 140 135 - 145 mmol/L   Potassium 3.5 3.5 - 5.1 mmol/L   Chloride 101 101 - 111 mmol/L   CO2 25 22 - 32 mmol/L   Glucose, Bld 169 (  H) 65 - 99 mg/dL   Chambers 9 6 - 20 mg/dL   Creatinine, Ser 0.79 0.44 - 1.00 mg/dL   Calcium 9.6 8.9 - 10.3 mg/dL   GFR calc non Af Amer >60 >60 mL/min   GFR calc Af Amer >60 >60 mL/min    Comment: (NOTE) The eGFR has been calculated using the CKD EPI equation. This calculation has not been validated in all clinical situations. eGFR's persistently <60 mL/min signify possible Chronic Kidney Disease.    Anion gap 14 5 - 15  CBC     Status: Abnormal   Collection Time: 09/11/15  5:57 AM  Result Value Ref Range   WBC 10.9 (H) 4.0 - 10.5 K/uL   RBC 5.03 3.87 - 5.11 MIL/uL   Hemoglobin 15.0  12.0 - 15.0 g/dL   HCT 44.8 36.0 - 46.0 %   MCV 89.1 78.0 - 100.0 fL   MCH 29.8 26.0 - 34.0 pg   MCHC 33.5 30.0 - 36.0 g/dL   RDW 13.0 11.5 - 15.5 %   Platelets 200 150 - 400 K/uL  I-stat troponin, ED (not at [facility])     Status: None   Collection Time: 09/11/15  6:05 AM  Result Value Ref Range   Troponin i, poc 0.00 0.00 - 0.08 ng/mL   Comment 3            Comment: Due to the release kinetics of cTnI, a negative result within the first hours of the onset of symptoms does not rule out myocardial infarction with certainty. If myocardial infarction is still suspected, repeat the test at appropriate intervals.   I-Stat CG4 Lactic Acid, ED     Status: None   Collection Time: 09/11/15  6:38 AM  Result Value Ref Range   Lactic Acid, Venous 1.95 0.5 - 2.0 mmol/L  Troponin I     Status: None   Collection Time: 09/11/15 12:48 PM  Result Value Ref Range   Troponin I <0.03 <0.031 ng/mL    Comment:        NO INDICATION OF MYOCARDIAL INJURY.   Lipid panel     Status: Abnormal   Collection Time: 09/11/15 12:48 PM  Result Value Ref Range   Cholesterol 180 0 - 200 mg/dL   Triglycerides 84 <150 mg/dL   HDL 39 (L) >40 mg/dL   Total CHOL/HDL Ratio 4.6 RATIO   VLDL 17 0 - 40 mg/dL   LDL Cholesterol 124 (H) 0 - 99 mg/dL    Comment:        Total Cholesterol/HDL:CHD Risk Coronary Heart Disease Risk Table                     Men   Women  1/2 Average Risk   3.4   3.3  Average Risk       5.0   4.4  2 X Average Risk   9.6   7.1  3 X Average Risk  23.4   11.0        Use the calculated Patient Ratio above and the CHD Risk Table to determine the patient's CHD Risk.        ATP III CLASSIFICATION (LDL):  <100     mg/dL   Optimal  100-129  mg/dL   Near or Above                    Optimal  130-159  mg/dL   Borderline  160-189  mg/dL  High  >190     mg/dL   Very High   Glucose, capillary     Status: Abnormal   Collection Time: 09/11/15 12:57 PM  Result Value Ref Range    Glucose-Capillary 136 (H) 65 - 99 mg/dL  Hepatic function panel     Status: Abnormal   Collection Time: 09/11/15  4:08 PM  Result Value Ref Range   Total Protein 7.5 6.5 - 8.1 g/dL   Albumin 3.8 3.5 - 5.0 g/dL   AST 350 (H) 15 - 41 U/L   ALT 369 (H) 14 - 54 U/L   Alkaline Phosphatase 224 (H) 38 - 126 U/L   Total Bilirubin 7.4 (H) 0.3 - 1.2 mg/dL   Bilirubin, Direct 4.6 (H) 0.1 - 0.5 mg/dL   Indirect Bilirubin 2.8 (H) 0.3 - 0.9 mg/dL  MRSA PCR Screening     Status: None   Collection Time: 09/11/15  4:23 PM  Result Value Ref Range   MRSA by PCR NEGATIVE NEGATIVE    Comment:        The GeneXpert MRSA Assay (FDA approved for NASAL specimens only), is one component of a comprehensive MRSA colonization surveillance program. It is not intended to diagnose MRSA infection nor to guide or monitor treatment for MRSA infections.   Glucose, capillary     Status: Abnormal   Collection Time: 09/11/15  4:23 PM  Result Value Ref Range   Glucose-Capillary 155 (H) 65 - 99 mg/dL  Glucose, capillary     Status: None   Collection Time: 09/11/15  9:40 PM  Result Value Ref Range   Glucose-Capillary 97 65 - 99 mg/dL  Comprehensive metabolic panel     Status: Abnormal   Collection Time: 09/12/15  3:57 AM  Result Value Ref Range   Sodium 139 135 - 145 mmol/L   Potassium 3.8 3.5 - 5.1 mmol/L   Chloride 105 101 - 111 mmol/L   CO2 21 (L) 22 - 32 mmol/L   Glucose, Bld 118 (H) 65 - 99 mg/dL   Chambers 7 6 - 20 mg/dL   Creatinine, Ser 0.77 0.44 - 1.00 mg/dL   Calcium 8.9 8.9 - 10.3 mg/dL   Total Protein 6.6 6.5 - 8.1 g/dL   Albumin 3.3 (L) 3.5 - 5.0 g/dL   AST 244 (H) 15 - 41 U/L   ALT 311 (H) 14 - 54 U/L   Alkaline Phosphatase 231 (H) 38 - 126 U/L   Total Bilirubin 7.1 (H) 0.3 - 1.2 mg/dL   GFR calc non Af Amer >60 >60 mL/min   GFR calc Af Amer >60 >60 mL/min    Comment: (NOTE) The eGFR has been calculated using the CKD EPI equation. This calculation has not been validated in all clinical  situations. eGFR's persistently <60 mL/min signify possible Chronic Kidney Disease.    Anion gap 13 5 - 15  CBC     Status: Abnormal   Collection Time: 09/12/15  3:57 AM  Result Value Ref Range   WBC 11.9 (H) 4.0 - 10.5 K/uL   RBC 4.51 3.87 - 5.11 MIL/uL   Hemoglobin 13.4 12.0 - 15.0 g/dL   HCT 40.4 36.0 - 46.0 %   MCV 89.6 78.0 - 100.0 fL   MCH 29.7 26.0 - 34.0 pg   MCHC 33.2 30.0 - 36.0 g/dL   RDW 13.7 11.5 - 15.5 %   Platelets 181 150 - 400 K/uL  Glucose, capillary     Status: Abnormal   Collection Time: 09/12/15  7:41 AM  Result Value Ref Range   Glucose-Capillary 102 (H) 65 - 99 mg/dL   Comment 1 Notify RN    Comment 2 Document in Chart    Dg Chest 2 View  09/11/2015  CLINICAL DATA:  65 year old female with acute chest pain for 2 days. EXAM: CHEST  2 VIEW COMPARISON:  08/24/2013 and prior chest radiographs FINDINGS: The cardiomediastinal silhouette is unremarkable. Right hemidiaphragm elevation is unchanged. Mild chronic peribronchial thickening again noted. There is no evidence of focal airspace disease, pulmonary edema, suspicious pulmonary nodule/mass, pleural effusion, or pneumothorax. No acute bony abnormalities are identified. IMPRESSION: No evidence of acute cardiopulmonary disease. Mild chronic peribronchial thickening. Electronically Signed   By: Margarette Canada M.D.   On: 09/11/2015 07:10   US Abdomen Limited  09/11/2015  CLINICAL DATA:  65 year old female with acute abdominal pain for 2 days. EXAM: US ABDOMEN LIMITED - RIGHT UPPER QUADRANT COMPARISON:  12/01/2009 CT FINDINGS: Gallbladder: Gallstones are identified, with the largest measuring 2.3 cm at the neck. Gallbladder wall thickening and small amount of pericholecystic fluid noted. Common bile duct: Diameter: 9 mm. There is no evidence of intrahepatic biliary dilatation. No definite choledocholithiasis are identified within the visualized CBD Liver: Increased echogenicity of the liver is identified without focal  abnormality. IMPRESSION: Cholelithiasis with gallbladder wall thickening and pericholecystic fluid highly suspicious for acute cholecystitis. 2.3 cm gallstone at the neck. Mild CBD dilatation. These results were discussed with Loree Fee, RN on 09/11/2015 at 3:30 p.m. Electronically Signed   By: Margarette Canada M.D.   On: 09/11/2015 15:32    Assessment: Cholelithiasis, possible cholecystitis and strong specimen of choledocholithiasis. Plan:  IV antibiotics ERCP today. Risk rationale alternatives explained patient she wishes to proceed. Genasis Zingale C 09/12/2015, 10:32 AM  Pager [phone] If no answer or after 5 PM call [phone]

## 2015-09-12 NOTE — Care Management Obs Status (Signed)
MEDICARE OBSERVATION STATUS NOTIFICATION   Patient Details  Name: Tamara Chambers MRN: [id number] Date of Birth: 10-Mar-1951   Medicare Observation Status Notification Given:  Yes    Elliot Cousin, RN 09/12/2015, 11:51 AM

## 2015-09-12 NOTE — Anesthesia Preprocedure Evaluation (Addendum)
Anesthesia Evaluation  Patient identified by MRN, date of birth, ID band Patient awake    Reviewed: Allergy & Precautions, H&P , NPO status , Patient's Chart, lab work & pertinent test results  History of Anesthesia Complications Negative for: history of anesthetic complications  Airway Mallampati: II  TM Distance: >3 FB Neck ROM: full    Dental no notable dental hx. (+) Teeth Intact, Dental Advisory Given   Pulmonary COPD, former smoker,    Pulmonary exam normal breath sounds clear to auscultation       Cardiovascular hypertension, Normal cardiovascular exam Rhythm:regular Rate:Normal     Neuro/Psych Seizures -,   Neuromuscular disease    GI/Hepatic Neg liver ROS, GERD  ,  Endo/Other  diabetes, Type 2  Renal/GU negative Renal ROS     Musculoskeletal   Abdominal   Peds  Hematology negative hematology ROS (+)   Anesthesia Other Findings   Reproductive/Obstetrics negative OB ROS                            Anesthesia Physical Anesthesia Plan  ASA: III  Anesthesia Plan: General   Post-op Pain Management:    Induction: Intravenous and Rapid sequence  Airway Management Planned: Oral ETT  Additional Equipment:   Intra-op Plan:   Post-operative Plan: Extubation in OR  Informed Consent: I have reviewed the patients History and Physical, chart, labs and discussed the procedure including the risks, benefits and alternatives for the proposed anesthesia with the patient or authorized representative who has indicated his/her understanding and acceptance.   Dental Advisory Given  Plan Discussed with: Anesthesiologist, CRNA and Surgeon  Anesthesia Plan Comments:         Anesthesia Quick Evaluation

## 2015-09-12 NOTE — Op Note (Signed)
[facility] [address] [place], [address]   ERCP PROCEDURE REPORT        EXAM DATE: 10-07-15  PATIENT NAME:          Tamara Chambers, Tamara Chambers          MR #: [id number] BIRTHDATE:       Apr 04, 1951     VISIT #:     [id number] ATTENDING:     Dorena Cookey, MD     STATUS:     outpatient ASSISTANT:      Beryle Beams and Dwain Sarna  INDICATIONS:  The patient is a 64 yr old female here for an ERCP due to   suspected common bile duct stones PROCEDURE PERFORMED:       ERCP with sphincterotomy and stone extraction MEDICATIONS:     general anesthesia  CONSENT: The patient understands the risks and benefits of the procedure and understands that these risks include, but are not limited to: sedation, allergic reaction, infection, perforation and/or bleeding. Alternative means of evaluation and treatment include, among others: physical exam, x-rays, and/or surgical intervention. The patient elects to proceed with this endoscopic procedure.  DESCRIPTION OF PROCEDURE: During intra-op preparation period all mechanical & medical equipment was checked for proper function. Hand hygiene and appropriate measures for infection prevention was taken. After the risks, benefits and alternatives of the procedure were thoroughly explained, Informed was verified, confirmed and timeout was successfully executed by the treatment team. With the patient in left semi-prone position, medications were administered intravenously.The Pentax Ercp Scope [id number] was passed from the mouth into the esophagus and further advanced from the esophagus into the stomach. From stomach scope was directed to the  papilla of Vater    .  Major papilla was aligned with the duodenoscope. The scope position was confirmed fluoroscopically. Rest of the findings/therapeutics are given below. The scope was then completely withdrawn from the patient and the procedure completed. The pulse, BP, and O2  saturation were monitored and documented by the physician and the nursing staff throughout the entire procedure. The patient was cared for as planned according to standard protocol. The patient was then discharged to recovery in stable condition and with appropriate post procedure care. Estimated blood loss is zero unless otherwise noted in this procedure report.  esopha gus and stomach were grossly normal   the papilla of vater was cannulated with the sphincterotome and guidewire and a cholangiogram obtained. This showed a slightly dilated duct with a possible sphincterotomy was performed,. the bile duct was slightly dilated  with a possible small filling defect. The 12-15 mm balloon catheter was advanced and inflated and dragged through the papilla, with one small filling defect delivered consistent with small stone.several more balloon sweeps were made no further filling defects were seen. An occlusion cholangiogram at the end of the procedure was normal and there was good flow of semi-clear bile from the papilla    ADVERSE EVENT:       none immediate IMPRESSIONS:     small common bile duct stone removed after sphincterotomy.  RECOMMENDATIONS:       continue antibiotics, recheck liver function tests in the morning and proceed with laparoscopic cholecystectomy. REPEAT EXAM:   ___________________________________ Dorena Cookey, MD eSigned:  Dorena Cookey, MD 10/07/2015 1:58 PM   cc:  CPT CODES: ICD9 CODES:  The ICD and CPT codes recommended by this software are interpretations from the data that the clinical staff has captured with the software.  The verification of the translation of this report to the ICD and CPT codes and modifiers is the sole responsibility of the health care institution and practicing physician where this report was generated.  [employer] will not be held responsible for the validity of the ICD and CPT codes included on this report.   AMA assumes no liability for data contained or not contained herein. CPT is a Publishing rights manager of the [employer].   PATIENT NAME:  Tamara Chambers, Tamara Chambers MR#: [id number]

## 2015-09-12 NOTE — Progress Notes (Signed)
Subjective: Pt feels better after GI cocktail  Denies abdominal pain  Objective: Vital signs in last 24 hours: Temp:  [98.2 F (36.8 C)-98.8 F (37.1 C)] 98.7 F (37.1 C) (02/13 0523) Pulse Rate:  [61-93] 69 (02/13 0523) Resp:  [20] 20 (02/12 1242) BP: (128-171)/(50-70) 138/68 mmHg (02/13 0523) SpO2:  [97 %-100 %] 98 % (02/13 0523) Weight:  [96.798 kg (213 lb 6.4 oz)-98.113 kg (216 lb 4.8 oz)] 96.798 kg (213 lb 6.4 oz) (02/13 0523)    Intake/Output from previous day: 02/12 0701 - 02/13 0700 In: 201.7 [I.V.:151.7; IV Piggyback:50] Out: -  Intake/Output this shift:    GI: soft  NT ND negative Murphy's sign   Lab Results:   Recent Labs  09/11/15 0557 09/12/15 0357  WBC 10.9* 11.9*  HGB 15.0 13.4  HCT 44.8 40.4  PLT 200 181   BMET  Recent Labs  09/11/15 0557 09/12/15 0357  NA 140 139  K 3.5 3.8  CL 101 105  CO2 25 21*  GLUCOSE 169* 118*  BUN 9 7  CREATININE 0.79 0.77  CALCIUM 9.6 8.9   PT/INR No results for input(s): LABPROT, INR in the last 72 hours. ABG No results for input(s): PHART, HCO3 in the last 72 hours.  Invalid input(s): PCO2, PO2  Studies/Results: Dg Chest 2 View  09/11/2015  CLINICAL DATA:  65 year old female with acute chest pain for 2 days. EXAM: CHEST  2 VIEW COMPARISON:  08/24/2013 and prior chest radiographs FINDINGS: The cardiomediastinal silhouette is unremarkable. Right hemidiaphragm elevation is unchanged. Mild chronic peribronchial thickening again noted. There is no evidence of focal airspace disease, pulmonary edema, suspicious pulmonary nodule/mass, pleural effusion, or pneumothorax. No acute bony abnormalities are identified. IMPRESSION: No evidence of acute cardiopulmonary disease. Mild chronic peribronchial thickening. Electronically Signed   By: Harmon Pier M.D.   On: 09/11/2015 07:10   US Abdomen Limited  09/11/2015  CLINICAL DATA:  65 year old female with acute abdominal pain for 2 days. EXAM: US ABDOMEN LIMITED - RIGHT  UPPER QUADRANT COMPARISON:  12/01/2009 CT FINDINGS: Gallbladder: Gallstones are identified, with the largest measuring 2.3 cm at the neck. Gallbladder wall thickening and small amount of pericholecystic fluid noted. Common bile duct: Diameter: 9 mm. There is no evidence of intrahepatic biliary dilatation. No definite choledocholithiasis are identified within the visualized CBD Liver: Increased echogenicity of the liver is identified without focal abnormality. IMPRESSION: Cholelithiasis with gallbladder wall thickening and pericholecystic fluid highly suspicious for acute cholecystitis. 2.3 cm gallstone at the neck. Mild CBD dilatation. These results were discussed with Alphonzo Lemmings, RN on 09/11/2015 at 3:30 p.m. Electronically Signed   By: Harmon Pier M.D.   On: 09/11/2015 15:32    Anti-infectives: Anti-infectives    Start     Dose/Rate Route Frequency Ordered Stop   09/11/15 1630  cefTRIAXone (ROCEPHIN) 2 g in dextrose 5 % 50 mL IVPB     2 g 100 mL/hr over 30 Minutes Intravenous Every 24 hours 09/11/15 1542        Assessment/Plan:  Acute cholecystitis Probable CBD stone given dilated CBD and bilirubin of 7 She feels much better today and has non tender exam after GI cocktail  Discussed options of lap cholecystectomy and IOC first vs possible ERCP  FIRST VS POST OP, she would like to see GI prior to surgery   I have a high suspicion she has CBD stones and may have even passed some   Preop ERCP in this circumstance may be warranted   Consult GI  Lap chole once seen by GI and after ERCP if warranted        Lashauna Arpin A. 09/12/2015  

## 2015-09-13 ENCOUNTER — Encounter (HOSPITAL_COMMUNITY): Payer: Self-pay | Admitting: Certified Registered Nurse Anesthetist

## 2015-09-13 ENCOUNTER — Encounter (HOSPITAL_COMMUNITY)
Admission: EM | Disposition: A | Discharge status: Home / Self Care | Payer: Self-pay | Source: Home / Self Care | Attending: Internal Medicine

## 2015-09-13 ENCOUNTER — Inpatient Hospital Stay (HOSPITAL_COMMUNITY): Payer: Medicare Other | Admitting: Certified Registered Nurse Anesthetist

## 2015-09-13 DIAGNOSIS — Z9049 Acquired absence of other specified parts of digestive tract: Secondary | ICD-10-CM

## 2015-09-13 DIAGNOSIS — L298 Other pruritus: Secondary | ICD-10-CM

## 2015-09-13 HISTORY — PX: CHOLECYSTECTOMY: SHX55

## 2015-09-13 LAB — COMPREHENSIVE METABOLIC PANEL
ALK PHOS: 275 U/L — AB (ref 38–126)
ALT: 234 U/L — ABNORMAL HIGH (ref 14–54)
ANION GAP: 13 (ref 5–15)
AST: 153 U/L — ABNORMAL HIGH (ref 15–41)
Albumin: 3.1 g/dL — ABNORMAL LOW (ref 3.5–5.0)
BUN: <5 mg/dL — ABNORMAL LOW (ref 6–20)
CALCIUM: 9.3 mg/dL (ref 8.9–10.3)
CO2: 24 mmol/L (ref 22–32)
CREATININE: 0.83 mg/dL (ref 0.44–1.00)
Chloride: 106 mmol/L (ref 101–111)
Glucose, Bld: 128 mg/dL — ABNORMAL HIGH (ref 65–99)
Potassium: 3.8 mmol/L (ref 3.5–5.1)
SODIUM: 143 mmol/L (ref 135–145)
Total Bilirubin: 7.1 mg/dL — ABNORMAL HIGH (ref 0.3–1.2)
Total Protein: 6.7 g/dL (ref 6.5–8.1)

## 2015-09-13 LAB — CBC
HCT: 42.4 % (ref 36.0–46.0)
HEMOGLOBIN: 13.7 g/dL (ref 12.0–15.0)
MCH: 29.8 pg (ref 26.0–34.0)
MCHC: 32.3 g/dL (ref 30.0–36.0)
MCV: 92.2 fL (ref 78.0–100.0)
PLATELETS: 186 10*3/uL (ref 150–400)
RBC: 4.6 MIL/uL (ref 3.87–5.11)
RDW: 14.3 % (ref 11.5–15.5)
WBC: 8.1 10*3/uL (ref 4.0–10.5)

## 2015-09-13 LAB — GLUCOSE, CAPILLARY
Glucose-Capillary: 109 mg/dL — ABNORMAL HIGH (ref 65–99)
Glucose-Capillary: 107 mg/dL — ABNORMAL HIGH (ref 65–99)
Glucose-Capillary: 108 mg/dL — ABNORMAL HIGH (ref 65–99)
Glucose-Capillary: 131 mg/dL — ABNORMAL HIGH (ref 65–99)

## 2015-09-13 SURGERY — LAPAROSCOPIC CHOLECYSTECTOMY
Anesthesia: General | Site: Abdomen

## 2015-09-13 MED ORDER — ROCURONIUM BROMIDE 50 MG/5ML IV SOLN
INTRAVENOUS | Status: AC
  Filled 2015-09-13: qty 1

## 2015-09-13 MED ORDER — FENTANYL CITRATE (PF) 250 MCG/5ML IJ SOLN
INTRAMUSCULAR | Status: AC
  Filled 2015-09-13: qty 5

## 2015-09-13 MED ORDER — CHOLESTYRAMINE LIGHT 4 G PO PACK
4.0000 g | PACK | Freq: Every day | ORAL | Status: DC
  Administered 2015-09-13 – 2015-09-14 (×2): 4 g via ORAL
  Filled 2015-09-13 (×2): qty 1

## 2015-09-13 MED ORDER — SUCCINYLCHOLINE CHLORIDE 20 MG/ML IJ SOLN
INTRAMUSCULAR | Status: DC | PRN
  Administered 2015-09-13: 100 mg via INTRAVENOUS

## 2015-09-13 MED ORDER — ROCURONIUM BROMIDE 100 MG/10ML IV SOLN
INTRAVENOUS | Status: DC | PRN
  Administered 2015-09-13: 50 mg via INTRAVENOUS

## 2015-09-13 MED ORDER — ESMOLOL HCL 100 MG/10ML IV SOLN
INTRAVENOUS | Status: DC | PRN
  Administered 2015-09-13: 40 mg via INTRAVENOUS

## 2015-09-13 MED ORDER — OXYCODONE-ACETAMINOPHEN 5-325 MG PO TABS: 1.0000 | ORAL_TABLET | ORAL | Status: DC | PRN

## 2015-09-13 MED ORDER — PHENYLEPHRINE HCL 10 MG/ML IJ SOLN
INTRAMUSCULAR | Status: DC | PRN
  Administered 2015-09-13 (×3): 120 ug via INTRAVENOUS
  Administered 2015-09-13: 40 ug via INTRAVENOUS

## 2015-09-13 MED ORDER — PROMETHAZINE HCL 25 MG/ML IJ SOLN
INTRAMUSCULAR | Status: AC
  Filled 2015-09-13: qty 1

## 2015-09-13 MED ORDER — ONDANSETRON HCL 4 MG/2ML IJ SOLN
INTRAMUSCULAR | Status: AC
  Filled 2015-09-13: qty 2

## 2015-09-13 MED ORDER — TRAMADOL HCL 50 MG PO TABS
100.0000 mg | ORAL_TABLET | Freq: Four times a day (QID) | ORAL | Status: DC | PRN
  Administered 2015-09-13 – 2015-09-14 (×3): 100 mg via ORAL
  Filled 2015-09-13 (×4): qty 2

## 2015-09-13 MED ORDER — SODIUM CHLORIDE 0.9 % IJ SOLN
INTRAMUSCULAR | Status: AC
  Filled 2015-09-13: qty 10

## 2015-09-13 MED ORDER — SODIUM CHLORIDE 0.9 % IR SOLN
Status: DC | PRN
Start: 2015-09-13 — End: 2015-09-13
  Administered 2015-09-13: 2000 mL

## 2015-09-13 MED ORDER — SUCCINYLCHOLINE CHLORIDE 20 MG/ML IJ SOLN
INTRAMUSCULAR | Status: AC
  Filled 2015-09-13: qty 1

## 2015-09-13 MED ORDER — PROPOFOL 10 MG/ML IV BOLUS
INTRAVENOUS | Status: DC | PRN
  Administered 2015-09-13: 170 mg via INTRAVENOUS

## 2015-09-13 MED ORDER — 0.9 % SODIUM CHLORIDE (POUR BTL) OPTIME
TOPICAL | Status: DC | PRN
Start: 2015-09-13 — End: 2015-09-13
  Administered 2015-09-13: 2000 mL

## 2015-09-13 MED ORDER — EPHEDRINE SULFATE 50 MG/ML IJ SOLN
INTRAMUSCULAR | Status: AC
  Filled 2015-09-13: qty 1

## 2015-09-13 MED ORDER — LACTATED RINGERS IV SOLN
INTRAVENOUS | Status: DC
  Administered 2015-09-13: 09:00:00 via INTRAVENOUS

## 2015-09-13 MED ORDER — FENTANYL CITRATE (PF) 100 MCG/2ML IJ SOLN
INTRAMUSCULAR | Status: DC | PRN
  Administered 2015-09-13 (×2): 100 ug via INTRAVENOUS
  Administered 2015-09-13: 50 ug via INTRAVENOUS

## 2015-09-13 MED ORDER — SODIUM CHLORIDE 0.9 % IV SOLN
INTRAVENOUS | Status: DC | PRN
  Administered 2015-09-13: 11:00:00 via INTRAVENOUS

## 2015-09-13 MED ORDER — LIDOCAINE HCL (CARDIAC) 20 MG/ML IV SOLN
INTRAVENOUS | Status: AC
  Filled 2015-09-13: qty 5

## 2015-09-13 MED ORDER — ALBUTEROL SULFATE HFA 108 (90 BASE) MCG/ACT IN AERS
INHALATION_SPRAY | RESPIRATORY_TRACT | Status: DC | PRN
  Administered 2015-09-13: 6 via RESPIRATORY_TRACT

## 2015-09-13 MED ORDER — BUPIVACAINE-EPINEPHRINE (PF) 0.25% -1:200000 IJ SOLN
INTRAMUSCULAR | Status: AC
  Filled 2015-09-13: qty 30

## 2015-09-13 MED ORDER — LIDOCAINE HCL (CARDIAC) 20 MG/ML IV SOLN
INTRAVENOUS | Status: DC | PRN
  Administered 2015-09-13: 60 mg via INTRAVENOUS

## 2015-09-13 MED ORDER — ALBUTEROL SULFATE HFA 108 (90 BASE) MCG/ACT IN AERS
INHALATION_SPRAY | RESPIRATORY_TRACT | Status: AC
  Filled 2015-09-13: qty 6.7

## 2015-09-13 MED ORDER — PHENYLEPHRINE HCL 10 MG/ML IJ SOLN
10.0000 mg | INTRAMUSCULAR | Status: DC | PRN
  Administered 2015-09-13: 15 ug/min via INTRAVENOUS

## 2015-09-13 MED ORDER — MIDAZOLAM HCL 2 MG/2ML IJ SOLN
INTRAMUSCULAR | Status: AC
  Filled 2015-09-13: qty 2

## 2015-09-13 MED ORDER — BUPIVACAINE-EPINEPHRINE 0.25% -1:200000 IJ SOLN
INTRAMUSCULAR | Status: DC | PRN
Start: 2015-09-13 — End: 2015-09-13
  Administered 2015-09-13: 14 mL

## 2015-09-13 MED ORDER — ESMOLOL HCL 100 MG/10ML IV SOLN
INTRAVENOUS | Status: AC
  Filled 2015-09-13: qty 10

## 2015-09-13 MED ORDER — ONDANSETRON HCL 4 MG/2ML IJ SOLN
INTRAMUSCULAR | Status: DC | PRN
  Administered 2015-09-13: 4 mg via INTRAVENOUS

## 2015-09-13 MED ORDER — MIDAZOLAM HCL 5 MG/5ML IJ SOLN
INTRAMUSCULAR | Status: DC | PRN
  Administered 2015-09-13: 2 mg via INTRAVENOUS

## 2015-09-13 MED ORDER — PROPOFOL 10 MG/ML IV BOLUS
INTRAVENOUS | Status: AC
  Filled 2015-09-13: qty 40

## 2015-09-13 MED ORDER — SUGAMMADEX SODIUM 500 MG/5ML IV SOLN
INTRAVENOUS | Status: DC | PRN
  Administered 2015-09-13: 200 mg via INTRAVENOUS

## 2015-09-13 MED ORDER — MICROFIBRILLAR COLL HEMOSTAT EX PADS
MEDICATED_PAD | CUTANEOUS | Status: DC | PRN
  Administered 2015-09-13: 1 via TOPICAL

## 2015-09-13 SURGICAL SUPPLY — 46 items
APPLIER CLIP ROT 10 11.4 M/L (STAPLE) ×4
APR CLP MED LRG 11.4X10 (STAPLE) ×2
BAG SPEC RTRVL LRG 6X4 10 (ENDOMECHANICALS) ×2
BLADE SURG ROTATE 9660 (MISCELLANEOUS) IMPLANT
CANISTER SUCTION 2500CC (MISCELLANEOUS) ×4 IMPLANT
CHLORAPREP W/TINT 26ML (MISCELLANEOUS) ×4 IMPLANT
CLIP APPLIE ROT 10 11.4 M/L (STAPLE) ×2 IMPLANT
COVER MAYO STAND STRL (DRAPES) ×4 IMPLANT
COVER SURGICAL LIGHT HANDLE (MISCELLANEOUS) ×4 IMPLANT
DRAPE C-ARM 42X72 X-RAY (DRAPES) ×4 IMPLANT
DRAPE WARM FLUID 44X44 (DRAPE) ×4 IMPLANT
ELECT REM PT RETURN 9FT ADLT (ELECTROSURGICAL) ×4
ELECTRODE REM PT RTRN 9FT ADLT (ELECTROSURGICAL) ×2 IMPLANT
ENDOLOOP SUT PDS II  0 18 (SUTURE) ×2
ENDOLOOP SUT PDS II 0 18 (SUTURE) ×1 IMPLANT
GLOVE BIO SURGEON STRL SZ 6.5 (GLOVE) ×2 IMPLANT
GLOVE BIO SURGEON STRL SZ8 (GLOVE) ×4 IMPLANT
GLOVE BIO SURGEONS STRL SZ 6.5 (GLOVE) ×1
GLOVE BIOGEL PI IND STRL 7.0 (GLOVE) ×1 IMPLANT
GLOVE BIOGEL PI IND STRL 8 (GLOVE) ×2 IMPLANT
GLOVE BIOGEL PI INDICATOR 7.0 (GLOVE) ×2
GLOVE BIOGEL PI INDICATOR 8 (GLOVE) ×2
GOWN STRL REUS W/ TWL LRG LVL3 (GOWN DISPOSABLE) ×4 IMPLANT
GOWN STRL REUS W/ TWL XL LVL3 (GOWN DISPOSABLE) ×2 IMPLANT
GOWN STRL REUS W/TWL LRG LVL3 (GOWN DISPOSABLE) ×8
GOWN STRL REUS W/TWL XL LVL3 (GOWN DISPOSABLE) ×4
HEMOSTAT SNOW SURGICEL 2X4 (HEMOSTASIS) ×3 IMPLANT
KIT BASIN OR (CUSTOM PROCEDURE TRAY) ×4 IMPLANT
KIT ROOM TURNOVER OR (KITS) ×4 IMPLANT
LIQUID BAND (GAUZE/BANDAGES/DRESSINGS) ×4 IMPLANT
NS IRRIG 1000ML POUR BTL (IV SOLUTION) ×4 IMPLANT
PAD ARMBOARD 7.5X6 YLW CONV (MISCELLANEOUS) ×4 IMPLANT
POUCH SPECIMEN RETRIEVAL 10MM (ENDOMECHANICALS) ×4 IMPLANT
SCISSORS LAP 5X35 DISP (ENDOMECHANICALS) ×4 IMPLANT
SET CHOLANGIOGRAPH 5 50 .035 (SET/KITS/TRAYS/PACK) ×4 IMPLANT
SET IRRIG TUBING LAPAROSCOPIC (IRRIGATION / IRRIGATOR) ×4 IMPLANT
SLEEVE ENDOPATH XCEL 5M (ENDOMECHANICALS) ×4 IMPLANT
SPECIMEN JAR SMALL (MISCELLANEOUS) ×4 IMPLANT
SUT MNCRL AB 4-0 PS2 18 (SUTURE) ×4 IMPLANT
TOWEL OR 17X24 6PK STRL BLUE (TOWEL DISPOSABLE) ×4 IMPLANT
TOWEL OR 17X26 10 PK STRL BLUE (TOWEL DISPOSABLE) ×4 IMPLANT
TRAY LAPAROSCOPIC MC (CUSTOM PROCEDURE TRAY) ×4 IMPLANT
TROCAR XCEL BLUNT TIP 100MML (ENDOMECHANICALS) ×4 IMPLANT
TROCAR XCEL NON-BLD 11X100MML (ENDOMECHANICALS) ×4 IMPLANT
TROCAR XCEL NON-BLD 5MMX100MML (ENDOMECHANICALS) ×4 IMPLANT
TUBING INSUFFLATION (TUBING) ×4 IMPLANT

## 2015-09-13 NOTE — Progress Notes (Signed)
Subjective:  Patient seen just prior to leaving for surgery. She was in good spirits and appreciative. She did not have abdominal pain.  She did have pruritus- I told her that we will address it once she is back from surgery and she will have an order for cholestyramine so she can ask for it.   Objective: Vital signs in last 24 hours: Filed Vitals:   09/12/15 1450 09/12/15 1938 09/13/15 0451 09/13/15 1124  BP: 150/70 145/78 138/66 142/72  Pulse: 97 100 80 75  Temp:  98.4 F (36.9 C) 99.3 F (37.4 C) 98.1 F (36.7 C)  TempSrc:  Oral Oral   Resp: 17   22  Height:      Weight:   217 lb 6.4 oz (98.612 kg)   SpO2: 99% 95% 94% 91%   Weight change: 1 lb 1.6 oz (0.499 kg)  Intake/Output Summary (Last 24 hours) at 09/13/15 1431 Last data filed at 09/13/15 1116  Gross per 24 hour  Intake   1580 ml  Output      0 ml  Net   1580 ml   General: Vital signs reviewed. Patient in no acute distress HEENT: very minimal scleral icterus (surprisingly) Cardiovascular: regular rate, rhythm, no murmur appreciated  Pulmonary/Chest: Clear to auscultation bilaterally, no wheezing or crackles Abdominal: Soft, nontender, normal BS, nondistended, no epigastric pain  Extremities: No lower extremity edema bilaterally, .   Lab Results: Results for orders placed or performed during the hospital encounter of 09/11/15 (from the past 24 hour(s))  Glucose, capillary     Status: Abnormal   Collection Time: 09/12/15  5:01 PM  Result Value Ref Range   Glucose-Capillary 118 (H) 65 - 99 mg/dL   Comment 1 Notify RN    Comment 2 Document in Chart   Glucose, capillary     Status: Abnormal   Collection Time: 09/12/15  9:50 PM  Result Value Ref Range   Glucose-Capillary 132 (H) 65 - 99 mg/dL  CBC     Status: None   Collection Time: 09/13/15  5:54 AM  Result Value Ref Range   WBC 8.1 4.0 - 10.5 K/uL   RBC 4.60 3.87 - 5.11 MIL/uL   Hemoglobin 13.7 12.0 - 15.0 g/dL   HCT 16.1 09.6 - 04.5 %   MCV 92.2  78.0 - 100.0 fL   MCH 29.8 26.0 - 34.0 pg   MCHC 32.3 30.0 - 36.0 g/dL   RDW 40.9 81.1 - 91.4 %   Platelets 186 150 - 400 K/uL  Comprehensive metabolic panel     Status: Abnormal   Collection Time: 09/13/15  5:54 AM  Result Value Ref Range   Sodium 143 135 - 145 mmol/L   Potassium 3.8 3.5 - 5.1 mmol/L   Chloride 106 101 - 111 mmol/L   CO2 24 22 - 32 mmol/L   Glucose, Bld 128 (H) 65 - 99 mg/dL   BUN <5 (L) 6 - 20 mg/dL   Creatinine, Ser 7.82 0.44 - 1.00 mg/dL   Calcium 9.3 8.9 - 95.6 mg/dL   Total Protein 6.7 6.5 - 8.1 g/dL   Albumin 3.1 (L) 3.5 - 5.0 g/dL   AST 213 (H) 15 - 41 U/L   ALT 234 (H) 14 - 54 U/L   Alkaline Phosphatase 275 (H) 38 - 126 U/L   Total Bilirubin 7.1 (H) 0.3 - 1.2 mg/dL   GFR calc non Af Amer >60 >60 mL/min   GFR calc Af Amer >60 >60 mL/min  Anion gap 13 5 - 15  Glucose, capillary     Status: Abnormal   Collection Time: 09/13/15  7:31 AM  Result Value Ref Range   Glucose-Capillary 109 (H) 65 - 99 mg/dL  Glucose, capillary     Status: Abnormal   Collection Time: 09/13/15 11:30 AM  Result Value Ref Range   Glucose-Capillary 107 (H) 65 - 99 mg/dL   Comment 1 Notify RN    Comment 2 Document in Chart     Micro Results: Recent Results (from the past 240 hour(s))  Culture, blood (Routine X 2) w Reflex to ID Panel     Status: None (Preliminary result)   Collection Time: 09/11/15  4:05 PM  Result Value Ref Range Status   Specimen Description RIGHT ANTECUBITAL  Final   Special Requests BOTTLES DRAWN AEROBIC AND ANAEROBIC 5CC  Final   Culture NO GROWTH < 24 HOURS  Final   Report Status PENDING  Incomplete  Culture, blood (Routine X 2) w Reflex to ID Panel     Status: None (Preliminary result)   Collection Time: 09/11/15  4:17 PM  Result Value Ref Range Status   Specimen Description BLOOD RIGHT HAND  Final   Special Requests BOTTLES DRAWN AEROBIC ONLY 3CC  Final   Culture NO GROWTH < 24 HOURS  Final   Report Status PENDING  Incomplete  MRSA PCR Screening      Status: None   Collection Time: 09/11/15  4:23 PM  Result Value Ref Range Status   MRSA by PCR NEGATIVE NEGATIVE Final    Comment:        The GeneXpert MRSA Assay (FDA approved for NASAL specimens only), is one component of a comprehensive MRSA colonization surveillance program. It is not intended to diagnose MRSA infection nor to guide or monitor treatment for MRSA infections.    Studies/Results: Dg Ercp Biliary & Pancreatic Ducts  09/12/2015  CLINICAL DATA:  65 year old female with a history of choledocholithiasis EXAM: ERCP TECHNIQUE: Multiple spot images obtained with the fluoroscopic device and submitted for interpretation post-procedure. FLUOROSCOPY TIME:  Fluoroscopy Time:  1 minutes 32 seconds COMPARISON:  Ultrasound 09/11/2015 FINDINGS: Initial image demonstrates endoscope projecting over the upper abdomen with cannulation of the ampulla and retrograde infusion of contrast into the extrahepatic biliary system. Final image demonstrates contrast either within duodenum are adjacent to the biliary system. A guidewire remains within the extrahepatic biliary system on the final image. No contrast visualized to cross the ampulla on these limited images. No large filling defects identified. IMPRESSION: Limited images during ERCP demonstrates partial opacification of the extrahepatic biliary system, with no filling defects of the common bile duct. Note that contrast is not visualized to cross the ampulla. Please refer to the dictated operative report for full details of intraoperative findings and procedure. Signed, Yvone Neu. Loreta Ave, DO [facility] Electronically Signed   By: Gilmer Mor D.O.   On: 09/12/2015 14:56   Medications: I have reviewed the patient's current medications. Scheduled Meds: . aspirin EC  81 mg Oral Daily  . cefTRIAXone (ROCEPHIN)  IV  2 g Intravenous Q24H  . insulin aspart  0-5 Units Subcutaneous QHS  .  insulin aspart  0-9 Units Subcutaneous TID WC  . lisinopril  20 mg Oral Daily  . pantoprazole (PROTONIX) IV  40 mg Intravenous Q12H  . promethazine      . sodium chloride flush  3 mL Intravenous Q12H   Continuous Infusions: . 0.9 %  NaCl with KCl 20 mEq / L 100 mL/hr at 09/13/15 1401   PRN Meds:.acetaminophen **OR** acetaminophen, albuterol, morphine injection, oxyCODONE-acetaminophen, promethazine **OR** promethazine **OR** promethazine Assessment/Plan: Principal Problem:   Acute calculous cholecystitis Active Problems:   Chest pain   HTN (hypertension)   Nausea and vomiting   Fibromyalgia   T2DM (type 2 diabetes mellitus) (HCC)   GERD (gastroesophageal reflux disease)   History of tobacco abuse   History of repair of hiatal hernia   Hyperlipidemia   Acute cholecystitis   Choledocholithiasis with acute cholecystitis  Acute calculous cholecystitis with gallbladder wall thickening and mild cbd dilation:  Yesterday, patient had a successful ERCP performed with stone extraction from cbd, and today she is going for lap chole. Her pain has been very well controlled. Her Tbili has continued to be at 7.1 which is concerning, and on 2/12 her direct bilirubin was 4.6. Hopefully, with the ERCP and the lap chole, the numbers will trend down.    -surgery following and appreciate recs, surgery today - post-op pain control- has morphine ordered  -zofran prn -trend LFTs and Tbili  -repeat cbc tomorrow to check post-op HgB -ceftriaxone per pharm -NS wth 20 meq Kcl at 100 cc/hr  -aspirin daily  -tramadol for pain, hold toradol and other nsaids -GI cocktail and maalox prn -carafate -Protonix 40 mg BID IV -phenergan 25 mg q6 PRN  Pruritus- secondary to cholestasis -cholestyramine daily, titrate accordingly   HTN BP stable  -lisinopril 20 gm daily  Fibromyalgia -tramadol -hold toradol and other nsaids    Dispo: Disposition is deferred at this time, awaiting improvement of current  medical problems.  Anticipated discharge in approximately 1 day(s).   The patient does have a current PCP Andrey Campanile Elizebeth Koller, MD) and does not need an [facility] hospital follow-up appointment after discharge.  The patient does not have transportation limitations that hinder transportation to clinic appointments.  .Services Needed at time of discharge: Y = Yes, Blank = No PT:   OT:   RN:   Equipment:   Other:     LOS: 1 day   Deneise Lever, MD 09/13/2015, 2:31 PM

## 2015-09-13 NOTE — Progress Notes (Signed)
Patient ID: Tamara Chambers, female   DOB: October 26, 1950, 65 y.o.   MRN: [id number]    Subjective:  Denies SSCP, palpitations or Dyspnea Getting prep for lap choly Had ERCP Dr Madilyn Fireman yesterday   Objective:  Filed Vitals:   09/12/15 1440 09/12/15 1450 09/12/15 1938 09/13/15 0451  BP: 159/44 150/70 145/78 138/66  Pulse: 101 97 100 80  Temp:   98.4 F (36.9 C) 99.3 F (37.4 C)  TempSrc:   Oral Oral  Resp: 24 17    Height:      Weight:    98.612 kg (217 lb 6.4 oz)  SpO2: 100% 99% 95% 94%    Intake/Output from previous day:  Intake/Output Summary (Last 24 hours) at 09/13/15 0856 Last data filed at 09/12/15 1900  Gross per 24 hour  Intake    480 ml  Output      0 ml  Net    480 ml    Physical Exam: Affect appropriate Healthy:  appears stated age HEENT: normal Neck supple with no adenopathy JVP normal no bruits no thyromegaly Lungs clear with no wheezing and good diaphragmatic motion Heart:  S1/S2 no murmur, no rub, gallop or click PMI normal Abdomen: benighn, BS positve, no tenderness, no AAA no bruit.  No HSM or HJR Distal pulses intact with no bruits No edema Neuro non-focal Skin warm and dry No muscular weakness   Lab Results: Basic Metabolic Panel:  Recent Labs  40/98/11 0357 09/13/15 0554  NA 139 143  K 3.8 3.8  CL 105 106  CO2 21* 24  GLUCOSE 118* 128*  BUN 7 <5*  CREATININE 0.77 0.83  CALCIUM 8.9 9.3   Liver Function Tests:  Recent Labs  09/12/15 0357 09/13/15 0554  AST 244* 153*  ALT 311* 234*  ALKPHOS 231* 275*  BILITOT 7.1* 7.1*  PROT 6.6 6.7  ALBUMIN 3.3* 3.1*   CBC:  Recent Labs  09/12/15 0357 09/13/15 0554  WBC 11.9* 8.1  HGB 13.4 13.7  HCT 40.4 42.4  MCV 89.6 92.2  PLT 181 186   Cardiac Enzymes:  Recent Labs  09/11/15 1248  TROPONINI <0.03   Hemoglobin A1C:  Recent Labs  09/11/15 1248  HGBA1C 5.6   Fasting Lipid Panel:  Recent Labs  09/11/15 1248  CHOL 180  HDL 39*  LDLCALC 124*  TRIG 84  CHOLHDL  4.6    Imaging: US Abdomen Limited  09/11/2015  CLINICAL DATA:  65 year old female with acute abdominal pain for 2 days. EXAM: US ABDOMEN LIMITED - RIGHT UPPER QUADRANT COMPARISON:  12/01/2009 CT FINDINGS: Gallbladder: Gallstones are identified, with the largest measuring 2.3 cm at the neck. Gallbladder wall thickening and small amount of pericholecystic fluid noted. Common bile duct: Diameter: 9 mm. There is no evidence of intrahepatic biliary dilatation. No definite choledocholithiasis are identified within the visualized CBD Liver: Increased echogenicity of the liver is identified without focal abnormality. IMPRESSION: Cholelithiasis with gallbladder wall thickening and pericholecystic fluid highly suspicious for acute cholecystitis. 2.3 cm gallstone at the neck. Mild CBD dilatation. These results were discussed with Alphonzo Lemmings, RN on 09/11/2015 at 3:30 p.m. Electronically Signed   By: Harmon Pier M.D.   On: 09/11/2015 15:32   Dg Ercp Biliary & Pancreatic Ducts  09/12/2015  CLINICAL DATA:  65 year old female with a history of choledocholithiasis EXAM: ERCP TECHNIQUE: Multiple spot images obtained with the fluoroscopic device and submitted for interpretation post-procedure. FLUOROSCOPY TIME:  Fluoroscopy Time:  1 minutes 32 seconds COMPARISON:  Ultrasound 09/11/2015 FINDINGS:  Initial image demonstrates endoscope projecting over the upper abdomen with cannulation of the ampulla and retrograde infusion of contrast into the extrahepatic biliary system. Final image demonstrates contrast either within duodenum are adjacent to the biliary system. A guidewire remains within the extrahepatic biliary system on the final image. No contrast visualized to cross the ampulla on these limited images. No large filling defects identified. IMPRESSION: Limited images during ERCP demonstrates partial opacification of the extrahepatic biliary system, with no filling defects of the common bile duct. Note that contrast is not  visualized to cross the ampulla. Please refer to the dictated operative report for full details of intraoperative findings and procedure. Signed, Yvone Neu. Loreta Ave, DO [facility] Electronically Signed   By: Gilmer Mor D.O.   On: 09/12/2015 14:56    Cardiac Studies:  ECG:  SR rate 57 normal    Telemetry:  NSR  09/13/2015   Echo:   Medications:   . aspirin EC  81 mg Oral Daily  . cefTRIAXone (ROCEPHIN)  IV  2 g Intravenous Q24H  . insulin aspart  0-5 Units Subcutaneous QHS  . insulin aspart  0-9 Units Subcutaneous TID WC  . lisinopril  20 mg Oral Daily  . pantoprazole (PROTONIX) IV  40 mg Intravenous Q12H  . sodium chloride flush  3 mL Intravenous Q12H     . 0.9 % NaCl with KCl 20 mEq / L 100 mL/hr at 09/13/15 0721  . lactated ringers 1,000 mL (09/12/15 1220)    Assessment/Plan:  Chest Pain:  More epigastric and RUQ related to colicystitis  Clear to have lap choly today  LFTls coming down post ERCP HTN:  Well controlled.  Continue current medications and low sodium Dash type diet.   DM:  SS insulin  No further cardiac recommendations will sign off   Charlton Haws 09/13/2015, 8:56 AM

## 2015-09-13 NOTE — Progress Notes (Signed)
Pt's glasses given to Rachel,NT from 3W.

## 2015-09-13 NOTE — H&P (View-Only) (Signed)
Subjective: Pt feels better after GI cocktail  Denies abdominal pain  Objective: Vital signs in last 24 hours: Temp:  [98.2 F (36.8 C)-98.8 F (37.1 C)] 98.7 F (37.1 C) (02/13 0523) Pulse Rate:  [61-93] 69 (02/13 0523) Resp:  [20] 20 (02/12 1242) BP: (128-171)/(50-70) 138/68 mmHg (02/13 0523) SpO2:  [97 %-100 %] 98 % (02/13 0523) Weight:  [96.798 kg (213 lb 6.4 oz)-98.113 kg (216 lb 4.8 oz)] 96.798 kg (213 lb 6.4 oz) (02/13 0523)    Intake/Output from previous day: 02/12 0701 - 02/13 0700 In: 201.7 [I.V.:151.7; IV Piggyback:50] Out: -  Intake/Output this shift:    GI: soft  NT ND negative Murphy's sign   Lab Results:   Recent Labs  09/11/15 0557 09/12/15 0357  WBC 10.9* 11.9*  HGB 15.0 13.4  HCT 44.8 40.4  PLT 200 181   BMET  Recent Labs  09/11/15 0557 09/12/15 0357  NA 140 139  K 3.5 3.8  CL 101 105  CO2 25 21*  GLUCOSE 169* 118*  BUN 9 7  CREATININE 0.79 0.77  CALCIUM 9.6 8.9   PT/INR No results for input(s): LABPROT, INR in the last 72 hours. ABG No results for input(s): PHART, HCO3 in the last 72 hours.  Invalid input(s): PCO2, PO2  Studies/Results: Dg Chest 2 View  09/11/2015  CLINICAL DATA:  65 year old female with acute chest pain for 2 days. EXAM: CHEST  2 VIEW COMPARISON:  08/24/2013 and prior chest radiographs FINDINGS: The cardiomediastinal silhouette is unremarkable. Right hemidiaphragm elevation is unchanged. Mild chronic peribronchial thickening again noted. There is no evidence of focal airspace disease, pulmonary edema, suspicious pulmonary nodule/mass, pleural effusion, or pneumothorax. No acute bony abnormalities are identified. IMPRESSION: No evidence of acute cardiopulmonary disease. Mild chronic peribronchial thickening. Electronically Signed   By: Harmon Pier M.D.   On: 09/11/2015 07:10   US Abdomen Limited  09/11/2015  CLINICAL DATA:  65 year old female with acute abdominal pain for 2 days. EXAM: US ABDOMEN LIMITED - RIGHT  UPPER QUADRANT COMPARISON:  12/01/2009 CT FINDINGS: Gallbladder: Gallstones are identified, with the largest measuring 2.3 cm at the neck. Gallbladder wall thickening and small amount of pericholecystic fluid noted. Common bile duct: Diameter: 9 mm. There is no evidence of intrahepatic biliary dilatation. No definite choledocholithiasis are identified within the visualized CBD Liver: Increased echogenicity of the liver is identified without focal abnormality. IMPRESSION: Cholelithiasis with gallbladder wall thickening and pericholecystic fluid highly suspicious for acute cholecystitis. 2.3 cm gallstone at the neck. Mild CBD dilatation. These results were discussed with Alphonzo Lemmings, RN on 09/11/2015 at 3:30 p.m. Electronically Signed   By: Harmon Pier M.D.   On: 09/11/2015 15:32    Anti-infectives: Anti-infectives    Start     Dose/Rate Route Frequency Ordered Stop   09/11/15 1630  cefTRIAXone (ROCEPHIN) 2 g in dextrose 5 % 50 mL IVPB     2 g 100 mL/hr over 30 Minutes Intravenous Every 24 hours 09/11/15 1542        Assessment/Plan:  Acute cholecystitis Probable CBD stone given dilated CBD and bilirubin of 7 She feels much better today and has non tender exam after GI cocktail  Discussed options of lap cholecystectomy and IOC first vs possible ERCP  FIRST VS POST OP, she would like to see GI prior to surgery   I have a high suspicion she has CBD stones and may have even passed some   Preop ERCP in this circumstance may be warranted   Consult GI  Lap chole once seen by GI and after ERCP if warranted        Juanito Gonyer A. 09/12/2015

## 2015-09-13 NOTE — Discharge Summary (Signed)
Name: Tamara Chambers MRN: [id number] DOB: 1951-03-02 65 y.o. PCP: Kaleen Mask, MD  Date of Admission: 09/11/2015  5:04 AM Date of Discharge: 09/14/2015 Attending Physician: Doneen Poisson, MD  Discharge Diagnosis: 1. Acute Calculous Cholecystitis   Principal Problem:   Acute calculous cholecystitis Active Problems:   Chest pain   HTN (hypertension)   Nausea and vomiting   Fibromyalgia   T2DM (type 2 diabetes mellitus) (HCC)   GERD (gastroesophageal reflux disease)   History of tobacco abuse   History of repair of hiatal hernia   Hyperlipidemia   Acute cholecystitis   Choledocholithiasis with acute cholecystitis  Discharge Medications:   Medication List    STOP taking these medications        ketorolac 10 MG tablet  Commonly known as:  TORADOL      TAKE these medications        albuterol 90 MCG/ACT inhaler  Commonly known as:  PROVENTIL,VENTOLIN  Inhale 2 puffs into the lungs as needed.     cholestyramine light 4 g packet  Commonly known as:  PREVALITE  Take 1 packet (4 g total) by mouth daily.     docusate sodium 100 MG capsule  Commonly known as:  COLACE  Take 1 capsule (100 mg total) by mouth daily.     lisinopril 20 MG tablet  Commonly known as:  PRINIVIL,ZESTRIL  Take 20 mg by mouth daily.     polyethylene glycol packet  Commonly known as:  MIRALAX / GLYCOLAX  Take 17 g by mouth daily.     traMADol 50 MG tablet  Commonly known as:  ULTRAM  Take 50 mg by mouth every 6 (six) hours as needed for moderate pain.        Disposition and follow-up:   Ms.Arneshia PERRIS TRIPATHI was discharged from [facility] in Good condition.  At the hospital follow up visit please address:  1.  Itching, LFTs, abdominal pain  2.  Labs / imaging needed at time of follow-up: LFTs  3.  Pending labs/ test needing follow-up: none  Follow-up Appointments: Follow-up Information    Follow up with [facility]. Go on 10/05/2015.   Specialty:  General Surgery   Why:  For post-operation check. Your appointment is at 10:15am on 10/05/15, please arrive at least before your appointment to complete your check in paperwork.  If you are unable to arrive early we may have to cancel/reschedule.   Contact information:   [address] [place] [address] [phone]      Follow up with Kaleen Mask, MD. Go on 09/27/2015.   Specialty:  Family Medicine   Why:  10am   Contact information:   [address] [place] [address] [phone]       Discharge Instructions: Discharge Instructions    Call MD for:  persistant nausea and vomiting    Complete by:  As directed      Call MD for:  redness, tenderness, or signs of infection (pain, swelling, redness, odor or green/yellow discharge around incision site)    Complete by:  As directed      Call MD for:  severe uncontrolled pain    Complete by:  As directed      Call MD for:  temperature >100.4    Complete by:  As directed      Diet - low sodium heart healthy    Complete by:  As directed  Discharge wound care:    Complete by:  As directed   Keep wounds clean and dry.  Do not remove adhesive until you have followed up with the surgeons.     Increase activity slowly    Complete by:  As directed            Consultations: Treatment Team:  Md Montez Morita, MD Dorena Cookey, MD  Procedures Performed:  Dg Chest 2 View  09/11/2015  CLINICAL DATA:  65 year old female with acute chest pain for 2 days. EXAM: CHEST  2 VIEW COMPARISON:  08/24/2013 and prior chest radiographs FINDINGS: The cardiomediastinal silhouette is unremarkable. Right hemidiaphragm elevation is unchanged. Mild chronic peribronchial thickening again noted. There is no evidence of focal airspace disease, pulmonary edema, suspicious pulmonary nodule/mass, pleural effusion, or pneumothorax. No acute bony abnormalities are identified. IMPRESSION: No evidence of  acute cardiopulmonary disease. Mild chronic peribronchial thickening. Electronically Signed   By: Harmon Pier M.D.   On: 09/11/2015 07:10   US Abdomen Limited  09/11/2015  CLINICAL DATA:  65 year old female with acute abdominal pain for 2 days. EXAM: US ABDOMEN LIMITED - RIGHT UPPER QUADRANT COMPARISON:  12/01/2009 CT FINDINGS: Gallbladder: Gallstones are identified, with the largest measuring 2.3 cm at the neck. Gallbladder wall thickening and small amount of pericholecystic fluid noted. Common bile duct: Diameter: 9 mm. There is no evidence of intrahepatic biliary dilatation. No definite choledocholithiasis are identified within the visualized CBD Liver: Increased echogenicity of the liver is identified without focal abnormality. IMPRESSION: Cholelithiasis with gallbladder wall thickening and pericholecystic fluid highly suspicious for acute cholecystitis. 2.3 cm gallstone at the neck. Mild CBD dilatation. These results were discussed with Alphonzo Lemmings, RN on 09/11/2015 at 3:30 p.m. Electronically Signed   By: Harmon Pier M.D.   On: 09/11/2015 15:32   Dg Ercp Biliary & Pancreatic Ducts  09/12/2015  CLINICAL DATA:  65 year old female with a history of choledocholithiasis EXAM: ERCP TECHNIQUE: Multiple spot images obtained with the fluoroscopic device and submitted for interpretation post-procedure. FLUOROSCOPY TIME:  Fluoroscopy Time:  1 minutes 32 seconds COMPARISON:  Ultrasound 09/11/2015 FINDINGS: Initial image demonstrates endoscope projecting over the upper abdomen with cannulation of the ampulla and retrograde infusion of contrast into the extrahepatic biliary system. Final image demonstrates contrast either within duodenum are adjacent to the biliary system. A guidewire remains within the extrahepatic biliary system on the final image. No contrast visualized to cross the ampulla on these limited images. No large filling defects identified. IMPRESSION: Limited images during ERCP demonstrates partial  opacification of the extrahepatic biliary system, with no filling defects of the common bile duct. Note that contrast is not visualized to cross the ampulla. Please refer to the dictated operative report for full details of intraoperative findings and procedure. Signed, Yvone Neu. Loreta Ave, DO [facility] Electronically Signed   By: Gilmer Mor D.O.   On: 09/12/2015 14:56    2D Echo:   Cardiac Cath:   Admission HPI:  This is a 65 yo woman with past medical history of HTN, HLD, T2Dm, chronic GERD, diverticulosis, PUD in 1990s, History of esophageal strictures requiring dilation, and lap repair of hiatal hernia 2012, fibromyalgia, major depression, OSA, who is here for central chest pain since Friday. Patient says that on Thursday, her mother dropped off a tuna sandwich and she ate half of that, and the other leftover half on Friday afternoon. Then 4 hours after this, around 7:30 PM,she started having constant squeezing pressure like pain in  her central chest and epigastric area, which was nonradiating to the arm, back and jaw. The pain was about an 8/10 at its worst. She said she tried taking nitroglycerin and aspirin and it only decreased the pain to 7/10. She mentions that her back feels sore like she had a workout. The pain continued to stay throughout Saturday and that is why she came in this morning. She denies headaches or dizziness. She says the pain worsens when she takes a deep breath.She says the pain is improved when she leans forward and pain is worsened by sitting or lying down. She denies any increased shortness of breath. She says 2 weeks ago, she would be able to climb a flight of stairs before getting short of breath. She denies palpitations. She has expired nitroglycerin at home and says she took nitro about twice in the last year, but the pain was not like this. She sleeps daily on 4 pillows, and does not wake up at night with  SOB.She does not think laying flat makes her SOB worse but is not sure.   After the chest pain started, she had 8-10 episodes of nausea and 'dry heaving' on Saturday, and one clear liquid nonbloody emesis on Friday night after she ate. She denies diarrhea or blood in stool. She says she had "low grade fever" on Saturday but did not check it and also feels a little weak. She says she has not slept in 3 days. She is worried about a blood clot though there is no prior history of blood clot or no family history of blood clot.   SHe also has fibromyalgia for which she takes daily toradol and tramadol though she has not taken both in 2 days, and as such her body pain is unbearable. She was also on some "6 or 7 antidepressants in past" but she has weaned herself off of all of them by herself.   For her hypertension, she takes lisinopril 20 mg daily and does not miss doses except for the last 2 days. She has a PCP in [address] Dr Jeannetta Nap and she goes to him/her occasionally.   She says she had a cath in 2008 but there were no blockages and no stents. She had a stress test done a few years ago and she does not remember the results. She does not have a cardiologist here in [place].   Patient was admitted in September 2012 with symptomatic anemia with palpitations and fatigue and she was found to have large hiatal hernia And underwent elective lap repair with nissen fundoplication in September 2012.   In the ER, i stat troponin 0, and ekg unremarkable. Her pain unresolved by zofran and phenergan and nitro, she she is admitted for ACS rule out.    PMH: as listed above FH: mother had MI and stents at age 62, sister had 'cardiac arrythmia' at age 73. Grandfather had MI at age 32. Alcoholism in father  SH: smoked 1 ppd for 25 years and quit 12 years ago. She quit alcohol in 90s. No recent illicit drug use.    Hospital Course by problem list: Principal Problem:   Acute calculous  cholecystitis Active Problems:   Chest pain   HTN (hypertension)   Nausea and vomiting   Fibromyalgia   T2DM (type 2 diabetes mellitus) (HCC)   GERD (gastroesophageal reflux disease)   History of tobacco abuse   History of repair of hiatal hernia   Hyperlipidemia   Acute cholecystitis  Choledocholithiasis with acute cholecystitis   Acute calculous cholecystitis: RUQ ultrasound consistent with cholecystitis. Surgery was consulted who recommended GI consult. She was medically managed by IV fluids and ceftriaxone.  She had an ERCP done on 2/13 with successful CBD stone extraction. Her liver enzymes continued to be eelevated and her Tbili was 7.1 with Dbili of 4.  Lap chole on 2/14 was performed and well tolerated.  Pain was controlled on her home Tramadol.  Her diet was advanced and well tolerated.   Pruritus: Cholestyramine was started on 2/14 due to diffuse itching, likely due to cholestasis.  At follow up, please address whether patient has continued itching and need for continued Cholestyramine.  Discharge Vitals:   BP 123/58 mmHg  Pulse 83  Temp(Src) 98.2 F (36.8 C) (Oral)  Resp 18  Ht  (1.702 m)  Wt 224 lb (101.606 kg)  BMI 35.08 kg/m2  SpO2 93%  Discharge Labs:  Results for orders placed or performed during the hospital encounter of 09/11/15 (from the past 24 hour(s))  Glucose, capillary     Status: Abnormal   Collection Time: 09/13/15  5:36 PM  Result Value Ref Range   Glucose-Capillary 108 (H) 65 - 99 mg/dL  Glucose, capillary     Status: Abnormal   Collection Time: 09/13/15  9:23 PM  Result Value Ref Range   Glucose-Capillary 131 (H) 65 - 99 mg/dL  Comprehensive metabolic panel     Status: Abnormal   Collection Time: 09/14/15  7:11 AM  Result Value Ref Range   Sodium 138 135 - 145 mmol/L   Potassium 3.6 3.5 - 5.1 mmol/L   Chloride 104 101 - 111 mmol/L   CO2 26 22 - 32 mmol/L   Glucose, Bld 109 (H) 65 - 99 mg/dL   BUN <5 (L) 6 - 20 mg/dL   Creatinine, Ser  1.61 0.44 - 1.00 mg/dL   Calcium 8.4 (L) 8.9 - 10.3 mg/dL   Total Protein 5.6 (L) 6.5 - 8.1 g/dL   Albumin 2.7 (L) 3.5 - 5.0 g/dL   AST 78 (H) 15 - 41 U/L   ALT 146 (H) 14 - 54 U/L   Alkaline Phosphatase 216 (H) 38 - 126 U/L   Total Bilirubin 3.2 (H) 0.3 - 1.2 mg/dL   GFR calc non Af Amer >60 >60 mL/min   GFR calc Af Amer >60 >60 mL/min   Anion gap 8 5 - 15  CBC with Differential/Platelet     Status: Abnormal   Collection Time: 09/14/15  7:20 AM  Result Value Ref Range   WBC 8.3 4.0 - 10.5 K/uL   RBC 3.80 (L) 3.87 - 5.11 MIL/uL   Hemoglobin 11.5 (L) 12.0 - 15.0 g/dL   HCT 09.6 (L) 04.5 - 40.9 %   MCV 93.4 78.0 - 100.0 fL   MCH 30.3 26.0 - 34.0 pg   MCHC 32.4 30.0 - 36.0 g/dL   RDW 81.1 91.4 - 78.2 %   Platelets 134 (L) 150 - 400 K/uL   Neutrophils Relative % 69 %   Neutro Abs 5.8 1.7 - 7.7 K/uL   Lymphocytes Relative 22 %   Lymphs Abs 1.8 0.7 - 4.0 K/uL   Monocytes Relative 8 %   Monocytes Absolute 0.6 0.1 - 1.0 K/uL   Eosinophils Relative 1 %   Eosinophils Absolute 0.1 0.0 - 0.7 K/uL   Basophils Relative 0 %   Basophils Absolute 0.0 0.0 - 0.1 K/uL  Glucose, capillary     Status:  None   Collection Time: 09/14/15  7:34 AM  Result Value Ref Range   Glucose-Capillary 93 65 - 99 mg/dL  Glucose, capillary     Status: None   Collection Time: 09/14/15 11:50 AM  Result Value Ref Range   Glucose-Capillary 88 65 - 99 mg/dL    Signed: Jana Half, MD 09/14/2015, 4:23 PM    Services Ordered on Discharge:  Equipment Ordered on Discharge:

## 2015-09-13 NOTE — Op Note (Signed)
Laparoscopic Cholecystectomy  Procedure Note  Indications: This patient presents with symptomatic gallbladder disease and will undergo laparoscopic cholecystectomy.She had ERCP for CBD stone yesterday. The procedure has been discussed with the patient. Operative and non operative treatments have been discussed. Risks of surgery include bleeding, infection,  Common bile duct injury,  Injury to the stomach,liver, colon,small intestine, abdominal wall,  Diaphragm,  Major blood vessels,  And the need for an open procedure.  Other risks include worsening of medical problems, death,  DVT and pulmonary embolism, and cardiovascular events.   Medical options have also been discussed. The patient has been informed of long term expectations of surgery and non surgical options,  The patient agrees to proceed.    Pre-operative Diagnosis: Calculus of bile duct with acute cholecystitis and obstruction  Post-operative Diagnosis: Same  Surgeon: Mylei Brackeen A.   Assistants: OR staff  Anesthesia: General endotracheal anesthesia and Local anesthesia 0.25.% bupivacaine, with epinephrine  ASA Class: 2  Procedure Details  The patient was seen again in the Holding Room. The risks, benefits, complications, treatment options, and expected outcomes were discussed with the patient. The possibilities of reaction to medication, pulmonary aspiration, perforation of viscus, bleeding, recurrent infection, finding a normal gallbladder, the need for additional procedures, failure to diagnose a condition, the possible need to convert to an open procedure, and creating a complication requiring transfusion or operation were discussed with the patient. The patient and/or family concurred with the proposed plan, giving informed consent. The site of surgery properly noted/marked. The patient was taken to Operating Room, identified as Tamara Chambers and the procedure verified as Laparoscopic Cholecystectomy with Intraoperative  Cholangiograms. A Time Out was held and the above information confirmed.  Prior to the induction of general anesthesia, antibiotic prophylaxis was administered. General endotracheal anesthesia was then administered and tolerated well. After the induction, the abdomen was prepped in the usual sterile fashion. The patient was positioned in the supine position with the left arm comfortably tucked, along with some reverse Trendelenburg.  Local anesthetic agent was injected into the skin near the umbilicus and an incision made. The midline fascia was incised and the Hasson technique was used to introduce a 12 mm port under direct vision. It was secured with a figure of eight Vicryl suture placed in the usual fashion. Pneumoperitoneum was then created with CO2 and tolerated well without any adverse changes in the patient's vital signs. Additional trocars were introduced under direct vision with an 11 mm trocar in the epigastrium and 2 5 mm trocars in the right upper quadrant. All skin incisions were infiltrated with a local anesthetic agent before making the incision and placing the trocars.   The gallbladder was identified, the fundus grasped and retracted cephalad. Adhesions were lysed bluntly and with the electrocautery where indicated, taking care not to injure any adjacent organs or viscus. The infundibulum was grasped and retracted laterally, exposing the peritoneum overlying the triangle of Calot. This was then divided and exposed in a blunt fashion. The cystic duct was clearly identified and bluntly dissected circumferentially. The junctions of the gallbladder, cystic duct and common bile duct were clearly identified prior to the division of any linear structure.   An attempt at cholangiogram was done.  The cystic duct was extremely friable and I had concerns of tearing or puncturing the cystic duct.  The critical view was obtained and ERCP had cleared the duct.   I decided against IOC.   The cystic duct  was then  ligated with  surgical clips  on the patient side and  clipped on the gallbladder side and divided. The cystic artery was identified, dissected free, ligated with clips and divided as well. Posterior cystic artery clipped and divided.  The gallbladder was dissected from the liver bed in retrograde fashion with the electrocautery. The gallbladder was removed via Endocatch and umbilical incision. The liver bed was irrigated and inspected. Hemostasis was achieved with the electrocautery and Surgicel. . Copious irrigation was utilized and was repeatedly aspirated until clear all particulate matter. Hemostasis was achieved with no signs  Of bleeding or bile leakage.  Pneumoperitoneum was completely reduced after viewing removal of the trocars under direct vision. The wound was thoroughly irrigated and the fascia was then closed with a figure of eight suture; the skin was then closed with 4 O monocryl and liquid adhesive  was applied.  Instrument, sponge, and needle counts were correct at closure and at the conclusion of the case.   Findings: Cholecystitis with Cholelithiasis  Estimated Blood Loss: less than 50 mL         Drains: none          Total IV Fluids: 700 mL         Specimens: Gallbladder           Complications: None; patient tolerated the procedure well.         Disposition: PACU - hemodynamically stable.         Condition: stable

## 2015-09-13 NOTE — Anesthesia Procedure Notes (Signed)
Procedure Name: Intubation Date/Time: 09/13/2015 10:02 AM Performed by: Faustino Congress Mehdi Gironda Pre-anesthesia Checklist: Patient identified, Emergency Drugs available, Suction available and Patient being monitored Patient Re-evaluated:Patient Re-evaluated prior to inductionOxygen Delivery Method: Circle system utilized Preoxygenation: Pre-oxygenation with 100% oxygen Intubation Type: IV induction Ventilation: Mask ventilation without difficulty Laryngoscope Size: Mac and 3 Grade View: Grade II Tube type: Oral Tube size: 7.5 mm Number of attempts: 1 Airway Equipment and Method: Stylet Placement Confirmation: positive ETCO2,  ETT inserted through vocal cords under direct vision and breath sounds checked- equal and bilateral Secured at: 22 cm Tube secured with: Tape Dental Injury: Teeth and Oropharynx as per pre-operative assessment

## 2015-09-13 NOTE — Interval H&P Note (Signed)
History and Physical Interval Note:  09/13/2015 9:19 AM  Tamara Chambers  has presented today for surgery, with the diagnosis of gallbladder  The various methods of treatment have been discussed with the patient and family. After consideration of risks, benefits and other options for treatment, the patient has consented to  Procedure(s): LAPAROSCOPIC CHOLECYSTECTOMY WITH INTRAOPERATIVE CHOLANGIOGRAM (N/A) as a surgical intervention .  The patient's history has been reviewed, patient examined, no change in status, stable for surgery.  I have reviewed the patient's chart and labs.  Questions were answered to the patient's satisfaction.   The procedure has been discussed with the patient. Operative and non operative treatments have been discussed. Risks of surgery include bleeding, infection,  Common bile duct injury,  Injury to the stomach,liver, colon,small intestine, abdominal wall,  Diaphragm,  Major blood vessels,  And the need for an open procedure.  Other risks include worsening of medical problems, death,  DVT and pulmonary embolism, and cardiovascular events.   Medical options have also been discussed. The patient has been informed of long term expectations of surgery and non surgical options,  The patient agrees to proceed.    Tanyon Alipio A.

## 2015-09-13 NOTE — Transfer of Care (Signed)
Immediate Anesthesia Transfer of Care Note  Patient: Tamara Chambers  Procedure(s) Performed: Procedure(s): LAPAROSCOPIC CHOLECYSTECTOMY (N/A)  Patient Location: PACU  Anesthesia Type:General  Level of Consciousness: awake, alert , oriented and patient cooperative  Airway & Oxygen Therapy: Patient Spontanous Breathing and Patient connected to nasal cannula oxygen  Post-op Assessment: Report given to RN and Post -op Vital signs reviewed and stable  Post vital signs: Reviewed and stable  Last Vitals:  Filed Vitals:   09/13/15 0451 09/13/15 1124  BP: 138/66 142/72  Pulse: 80 75  Temp: 37.4 C 36.7 C  Resp:  22    Complications: No apparent anesthesia complications

## 2015-09-13 NOTE — Progress Notes (Signed)
Internal Medicine Attending  Date: 09/13/2015  Patient name: Tamara Chambers Medical record number: [id number] Date of birth: 17-Jul-1951 Age: 65 y.o. Gender: female  I saw and evaluated the patient. I reviewed the resident's note by Dr. Johnny Bridge and I agree with the resident's findings and plans as documented in his progress note.  [name] successfully underwent a laparoscopic cholecystectomy today. Postoperatively her only complaint is her fibromyalgia pain as well as some lower thoracic pain. She states the morphine has been ineffective at controlling her pain and wishes to return to the tramadol which is effective for her fibromyalgia pain which she feels most of her symptoms are related to. We therefore restarted the tramadol. We will reassess her pain control as well as her postoperative recovery in the morning. I anticipate she will be ready for discharge home within the next 24-48 hours.

## 2015-09-13 NOTE — Anesthesia Preprocedure Evaluation (Addendum)
Anesthesia Evaluation  Patient identified by MRN, date of birth, ID band Patient awake    Reviewed: Allergy & Precautions, NPO status , Patient's Chart, lab work & pertinent test results  History of Anesthesia Complications Negative for: history of anesthetic complications  Airway Mallampati: II  TM Distance: >3 FB Neck ROM: Full    Dental  (+) Dental Advisory Given, Teeth Intact   Pulmonary COPD,  COPD inhaler, former smoker,    Pulmonary exam normal        Cardiovascular hypertension, Pt. on medications Normal cardiovascular exam     Neuro/Psych Seizures -,  PSYCHIATRIC DISORDERS Anxiety Hx of Polio  Neuromuscular disease    GI/Hepatic GERD  Medicated,  Endo/Other  diabetes  Renal/GU      Musculoskeletal  (+) Arthritis , Fibromyalgia -  Abdominal   Peds  Hematology   Anesthesia Other Findings   Reproductive/Obstetrics                           Anesthesia Physical Anesthesia Plan  ASA: III  Anesthesia Plan: General   Post-op Pain Management:    Induction: Intravenous  Airway Management Planned: Oral ETT  Additional Equipment:   Intra-op Plan:   Post-operative Plan: Extubation in OR  Informed Consent: I have reviewed the patients History and Physical, chart, labs and discussed the procedure including the risks, benefits and alternatives for the proposed anesthesia with the patient or authorized representative who has indicated his/her understanding and acceptance.   Dental advisory given  Plan Discussed with: CRNA, Anesthesiologist and Surgeon  Anesthesia Plan Comments:         Anesthesia Quick Evaluation

## 2015-09-13 NOTE — Anesthesia Postprocedure Evaluation (Signed)
Anesthesia Post Note  Patient: Tamara Chambers  Procedure(s) Performed: Procedure(s) (LRB): LAPAROSCOPIC CHOLECYSTECTOMY (N/A)  Patient location during evaluation: PACU Anesthesia Type: General Level of consciousness: awake and alert Pain management: pain level controlled Vital Signs Assessment: post-procedure vital signs reviewed and stable Respiratory status: spontaneous breathing, nonlabored ventilation, respiratory function stable and patient connected to nasal cannula oxygen Cardiovascular status: blood pressure returned to baseline and stable Postop Assessment: no signs of nausea or vomiting Anesthetic complications: no    Last Vitals:  Filed Vitals:   09/13/15 0451 09/13/15 1124  BP: 138/66 142/72  Pulse: 80 75  Temp: 37.4 C 36.7 C  Resp:  22    Last Pain:  Filed Vitals:   09/13/15 1129  PainSc: 0-No pain                 Shelton Silvas

## 2015-09-13 NOTE — Anesthesia Postprocedure Evaluation (Signed)
Anesthesia Post Note  Patient: Tamara Chambers  Procedure(s) Performed: Procedure(s) (LRB): ENDOSCOPIC RETROGRADE CHOLANGIOPANCREATOGRAPHY (ERCP) (N/A)  Patient location during evaluation: PACU Anesthesia Type: General Level of consciousness: awake and alert Pain management: pain level controlled Vital Signs Assessment: post-procedure vital signs reviewed and stable Respiratory status: spontaneous breathing, nonlabored ventilation, respiratory function stable and patient connected to nasal cannula oxygen Cardiovascular status: blood pressure returned to baseline and stable Postop Assessment: no signs of nausea or vomiting Anesthetic complications: no    Last Vitals:           [name]

## 2015-09-14 ENCOUNTER — Encounter (HOSPITAL_COMMUNITY): Payer: Self-pay | Admitting: Surgery

## 2015-09-14 LAB — GLUCOSE, CAPILLARY
Glucose-Capillary: 93 mg/dL (ref 65–99)
Glucose-Capillary: 88 mg/dL (ref 65–99)
Glucose-Capillary: 119 mg/dL — ABNORMAL HIGH (ref 65–99)

## 2015-09-14 LAB — COMPREHENSIVE METABOLIC PANEL
ALBUMIN: 2.7 g/dL — AB (ref 3.5–5.0)
ALT: 146 U/L — AB (ref 14–54)
AST: 78 U/L — AB (ref 15–41)
Alkaline Phosphatase: 216 U/L — ABNORMAL HIGH (ref 38–126)
Anion gap: 8 (ref 5–15)
BUN: <5 mg/dL — ABNORMAL LOW (ref 6–20)
CHLORIDE: 104 mmol/L (ref 101–111)
CO2: 26 mmol/L (ref 22–32)
CREATININE: 0.77 mg/dL (ref 0.44–1.00)
Calcium: 8.4 mg/dL — ABNORMAL LOW (ref 8.9–10.3)
GFR calc non Af Amer: >60 mL/min (ref >60)
GLUCOSE: 109 mg/dL — AB (ref 65–99)
Potassium: 3.6 mmol/L (ref 3.5–5.1)
Sodium: 138 mmol/L (ref 135–145)
Total Bilirubin: 3.2 mg/dL — ABNORMAL HIGH (ref 0.3–1.2)
Total Protein: 5.6 g/dL — ABNORMAL LOW (ref 6.5–8.1)

## 2015-09-14 LAB — CBC WITH DIFFERENTIAL/PLATELET
Basophils Absolute: 0 10*3/uL (ref 0.0–0.1)
Basophils Relative: 0 %
EOS ABS: 0.1 10*3/uL (ref 0.0–0.7)
Eosinophils Relative: 1 %
HEMATOCRIT: 35.5 % — AB (ref 36.0–46.0)
HEMOGLOBIN: 11.5 g/dL — AB (ref 12.0–15.0)
LYMPHS ABS: 1.8 10*3/uL (ref 0.7–4.0)
Lymphocytes Relative: 22 %
MCH: 30.3 pg (ref 26.0–34.0)
MCHC: 32.4 g/dL (ref 30.0–36.0)
MCV: 93.4 fL (ref 78.0–100.0)
MONO ABS: 0.6 10*3/uL (ref 0.1–1.0)
MONOS PCT: 8 %
NEUTROS ABS: 5.8 10*3/uL (ref 1.7–7.7)
NEUTROS PCT: 69 %
Platelets: 134 10*3/uL — ABNORMAL LOW (ref 150–400)
RBC: 3.8 MIL/uL — ABNORMAL LOW (ref 3.87–5.11)
RDW: 14.4 % (ref 11.5–15.5)
WBC: 8.3 10*3/uL (ref 4.0–10.5)

## 2015-09-14 MED ORDER — PROMETHAZINE HCL 25 MG PO TABS: 12.5000 mg | ORAL_TABLET | Freq: Four times a day (QID) | ORAL | Status: DC | PRN

## 2015-09-14 MED ORDER — POLYETHYLENE GLYCOL 3350 17 G PO PACK
17.0000 g | PACK | Freq: Every day | ORAL | Status: DC
  Administered 2015-09-14: 17 g via ORAL
  Filled 2015-09-14: qty 1

## 2015-09-14 MED ORDER — PROMETHAZINE HCL 25 MG RE SUPP: 12.5000 mg | Freq: Four times a day (QID) | RECTAL | Status: DC | PRN

## 2015-09-14 MED ORDER — SENNA 8.6 MG PO TABS: 1.0000 | ORAL_TABLET | Freq: Every day | ORAL | Status: DC

## 2015-09-14 MED ORDER — CHOLESTYRAMINE LIGHT 4 G PO PACK: 4.0000 g | PACK | Freq: Every day | ORAL | Status: DC

## 2015-09-14 MED ORDER — POLYETHYLENE GLYCOL 3350 17 G PO PACK: 17.0000 g | PACK | Freq: Every day | ORAL | Status: DC

## 2015-09-14 MED ORDER — POLYETHYLENE GLYCOL 3350 17 G PO PACK
17.0000 g | PACK | Freq: Once | ORAL | Status: AC
Start: 2015-09-14 — End: 2015-09-14

## 2015-09-14 MED ORDER — DOCUSATE SODIUM 100 MG PO CAPS: 100.0000 mg | ORAL_CAPSULE | Freq: Every day | ORAL | Status: DC

## 2015-09-14 MED ORDER — PROMETHAZINE HCL 25 MG/ML IJ SOLN: 6.2500 mg | Freq: Four times a day (QID) | INTRAMUSCULAR | Status: DC | PRN

## 2015-09-14 NOTE — Progress Notes (Signed)
1 Day Post-Op  Subjective: Doing well Sore   Objective: Vital signs in last 24 hours: Temp:  [97.8 F (36.6 C)-98.8 F (37.1 C)] 98.5 F (36.9 C) (02/15 0500) Pulse Rate:  [58-84] 58 (02/15 0500) Resp:  [18-22] 18 (02/15 0500) BP: (124-163)/(61-85) 140/61 mmHg (02/15 0500) SpO2:  [91 %-98 %] 98 % (02/15 0500) Weight:  [101.606 kg (224 lb)] 101.606 kg (224 lb) (02/15 0500) Last BM Date: 09/11/15  Intake/Output from previous day: 02/14 0701 - 02/15 0700 In: 1460 [P.O.:360; I.V.:1100] Out: 2350 [Urine:2350] Intake/Output this shift:    Incision/Wound:CDI soft NT   Lab Results:   Recent Labs  09/13/15 0554 09/14/15 0720  WBC 8.1 8.3  HGB 13.7 11.5*  HCT 42.4 35.5*  PLT 186 134*   BMET  Recent Labs  09/13/15 0554 09/14/15 0711  NA 143 138  K 3.8 3.6  CL 106 104  CO2 24 26  GLUCOSE 128* 109*  BUN <5* <5*  CREATININE 0.83 0.77  CALCIUM 9.3 8.4*   PT/INR No results for input(s): LABPROT, INR in the last 72 hours. ABG No results for input(s): PHART, HCO3 in the last 72 hours.  Invalid input(s): PCO2, PO2  Studies/Results: Dg Ercp Biliary & Pancreatic Ducts  09/12/2015  CLINICAL DATA:  65 year old female with a history of choledocholithiasis EXAM: ERCP TECHNIQUE: Multiple spot images obtained with the fluoroscopic device and submitted for interpretation post-procedure. FLUOROSCOPY TIME:  Fluoroscopy Time:  1 minutes 32 seconds COMPARISON:  Ultrasound 09/11/2015 FINDINGS: Initial image demonstrates endoscope projecting over the upper abdomen with cannulation of the ampulla and retrograde infusion of contrast into the extrahepatic biliary system. Final image demonstrates contrast either within duodenum are adjacent to the biliary system. A guidewire remains within the extrahepatic biliary system on the final image. No contrast visualized to cross the ampulla on these limited images. No large filling defects identified. IMPRESSION: Limited images during ERCP  demonstrates partial opacification of the extrahepatic biliary system, with no filling defects of the common bile duct. Note that contrast is not visualized to cross the ampulla. Please refer to the dictated operative report for full details of intraoperative findings and procedure. Signed, Yvone Neu. Loreta Ave, DO [facility] Electronically Signed   By: Gilmer Mor D.O.   On: 09/12/2015 14:56    Anti-infectives: Anti-infectives    Start     Dose/Rate Route Frequency Ordered Stop   09/11/15 1630  cefTRIAXone (ROCEPHIN) 2 g in dextrose 5 % 50 mL IVPB     2 g 100 mL/hr over 30 Minutes Intravenous Every 24 hours 09/11/15 1542        Assessment/Plan: s/p Procedure(s): LAPAROSCOPIC CHOLECYSTECTOMY (N/A) Doing well Ok to discharge at any point  Follow up [facility]  2 weeks   LOS: 2 days    Tamara Chambers A. 09/14/2015

## 2015-09-14 NOTE — Progress Notes (Signed)
Internal Medicine Attending  Date: 09/14/2015  Patient name: Tamara Chambers Medical record number: [id number] Date of birth: [birth year] Age: 65 y.o. Gender: female  I saw and evaluated the patient. I reviewed the resident's note by Dr. Ladona Ridgel and I agree with the resident's findings and plans as documented in his progress note.  Tamara Chambers is recovering well from her laparoscopic cholecystectomy. She is a little sore but this has responded well to tramadol. She is discharged home today with follow-up in her primary care provider's office.

## 2015-09-14 NOTE — Progress Notes (Signed)
Subjective:  Tamara Chambers. Patient tolerated lap choley well.  Her pain is moderately controlled on her home Tramadol.  She does not want a stronger narcotic.  Her pain is worsened with coughing.  She tolerated clear diet well and feels like she can go home if she has a bowel movement.  Objective: Vital signs in last 24 hours: Filed Vitals:   09/13/15 1424 09/13/15 1749 09/13/15 1952 09/14/15 0500  BP: 163/68 143/85 124/64 140/61  Pulse: 59 61 84 58  Temp: 98.8 F (37.1 C)  97.8 F (36.6 C) 98.5 F (36.9 C)  TempSrc: Oral  Axillary Oral  Resp: Height:      Weight:    224 lb (101.606 kg)  SpO2: 98% 97% 96% 98%   Weight change: 6 lb 9.6 oz (2.994 kg)  Intake/Output Summary (Last 24 hours) at 09/14/15 0800 Last data filed at 09/13/15 2100  Gross per 24 hour  Intake   1460 ml  Output   2350 ml  Net   -890 ml   Physical Exam  Constitutional: She is oriented to person, place, and time and well-developed, well-nourished, and in no distress. No distress.  HENT:  Head: Normocephalic and atraumatic.  Eyes: EOM are normal. No scleral icterus.  Neck: No tracheal deviation present.  Cardiovascular: Normal rate, regular rhythm and normal heart sounds.   Pulmonary/Chest: Effort normal and breath sounds normal. No stridor. No respiratory distress. She has no wheezes.  Abdominal: Soft. She exhibits no distension. There is no rebound.  Diffusely tender to light palpation.  Laparoscopic incisions clean, dry, intact, without drainage or surrounding erythema.  Musculoskeletal: Normal range of motion.  Trace peripheral edema to midshin.  Neurological: She is alert and oriented to person, place, and time.  Skin: Skin is warm and dry. She is not diaphoretic.  Minimally jaundiced diffusely.     Lab Results: Results for orders placed or performed during the hospital encounter of 09/11/15 (from the past 24 hour(s))  Glucose, capillary     Status: Abnormal   Collection Time: 09/13/15  11:30 AM  Result Value Ref Range   Glucose-Capillary 107 (H) 65 - 99 mg/dL   Comment 1 Notify RN    Comment 2 Document in Chart   Glucose, capillary     Status: Abnormal   Collection Time: 09/13/15  5:36 PM  Result Value Ref Range   Glucose-Capillary 108 (H) 65 - 99 mg/dL  Glucose, capillary     Status: Abnormal   Collection Time: 09/13/15  9:23 PM  Result Value Ref Range   Glucose-Capillary 131 (H) 65 - 99 mg/dL    Micro Results: Recent Results (from the past 240 hour(s))  Culture, blood (Routine X 2) w Reflex to ID Panel     Status: None (Preliminary result)   Collection Time: 09/11/15  4:05 PM  Result Value Ref Range Status   Specimen Description RIGHT ANTECUBITAL  Final   Special Requests BOTTLES DRAWN AEROBIC AND ANAEROBIC 5CC  Final   Culture NO GROWTH 2 DAYS  Final   Report Status PENDING  Incomplete  Culture, blood (Routine X 2) w Reflex to ID Panel     Status: None (Preliminary result)   Collection Time: 09/11/15  4:17 PM  Result Value Ref Range Status   Specimen Description BLOOD RIGHT HAND  Final   Special Requests BOTTLES DRAWN AEROBIC ONLY 3CC  Final   Culture NO GROWTH 2 DAYS  Final   Report Status PENDING  Incomplete  MRSA PCR Screening     Status: None   Collection Time: 09/11/15  4:23 PM  Result Value Ref Range Status   MRSA by PCR NEGATIVE NEGATIVE Final    Comment:        The GeneXpert MRSA Assay (FDA approved for NASAL specimens only), is one component of a comprehensive MRSA colonization surveillance program. It is not intended to diagnose MRSA infection nor to guide or monitor treatment for MRSA infections.    Studies/Results: Dg Ercp Biliary & Pancreatic Ducts  09/12/2015  CLINICAL DATA:  65 year old female with a history of choledocholithiasis EXAM: ERCP TECHNIQUE: Multiple spot images obtained with the fluoroscopic device and submitted for interpretation post-procedure. FLUOROSCOPY TIME:  Fluoroscopy Time:  1 minutes 32 seconds COMPARISON:   Ultrasound 09/11/2015 FINDINGS: Initial image demonstrates endoscope projecting over the upper abdomen with cannulation of the ampulla and retrograde infusion of contrast into the extrahepatic biliary system. Final image demonstrates contrast either within duodenum are adjacent to the biliary system. A guidewire remains within the extrahepatic biliary system on the final image. No contrast visualized to cross the ampulla on these limited images. No large filling defects identified. IMPRESSION: Limited images during ERCP demonstrates partial opacification of the extrahepatic biliary system, with no filling defects of the common bile duct. Note that contrast is not visualized to cross the ampulla. Please refer to the dictated operative report for full details of intraoperative findings and procedure. Signed, Yvone Neu. Loreta Ave, DO [facility] Electronically Signed   By: Gilmer Mor D.O.   On: 09/12/2015 14:56   Medications: I have reviewed the patient's current medications. Scheduled Meds: . aspirin EC  81 mg Oral Daily  . cefTRIAXone (ROCEPHIN)  IV  2 g Intravenous Q24H  . cholestyramine light  4 g Oral Daily  . insulin aspart  0-5 Units Subcutaneous QHS  . insulin aspart  0-9 Units Subcutaneous TID WC  . lisinopril  20 mg Oral Daily  . pantoprazole (PROTONIX) IV  40 mg Intravenous Q12H  . sodium chloride flush  3 mL Intravenous Q12H   Continuous Infusions:   PRN Meds:.acetaminophen **OR** acetaminophen, albuterol, promethazine **OR** promethazine **OR** promethazine, traMADol Assessment/Plan: Principal Problem:   Acute calculous cholecystitis Active Problems:   Chest pain   HTN (hypertension)   Nausea and vomiting   Fibromyalgia   T2DM (type 2 diabetes mellitus) (HCC)   GERD (gastroesophageal reflux disease)   History of tobacco abuse   History of repair of hiatal hernia   Hyperlipidemia   Acute cholecystitis    Choledocholithiasis with acute cholecystitis   [name] is a 65 year old woman with a history of hypertension, hyperlipidemia, gastroesophageal reflux disease, major depression, and fibromyalgia who presents with acute epigastric pain.   Acute calculous cholecystitis:  Now s/p ERCP and lap choley.  Tolerated surgery well.  Her pain has been moderately controlled with Tramadol and patient does not want a stronger narcotic. LFTs appropriately trending down.  Tbili now 3.2.  Patient is stable for discharge if she tolerates lunch and has a BM. - Stop CTX - ASA daily  - Tramadol 100 mg - Miralax, Senna, Colace - Phenergan 25 mg q6 PRN  Pruritus: secondary to cholestasis - Cholestyramine daily, titrate accordingly   HTN BP stable  - Lisinopril 20 gm daily  Fibromyalgia - Tramadol - Hold toradol and other nsaids    Dispo: Disposition is deferred at this time, awaiting improvement of current medical problems.  Anticipated discharge in  approximately 1 day(s).   The patient does have a current PCP Andrey Campanile Elizebeth Koller, MD) and does not need an [facility] hospital follow-up appointment after discharge.  The patient does not have transportation limitations that hinder transportation to clinic appointments.  .Services Needed at time of discharge: Y = Yes, Blank = No PT:   OT:   RN:   Equipment:   Other:     LOS: 2 days   Jana Half, MD 09/14/2015, 8:00 AM

## 2015-09-14 NOTE — Plan of Care (Signed)
Problem: Respiratory: Goal: Ability to achieve and maintain a regular respiratory rate will improve Outcome: Progressing Patient is maintaining oxygen saturations >95% on 2LNC. Patient has her incentive spirometer at the bedside. Patient has been coughing and deep breathing.  Problem: Pain Management: Goal: General experience of comfort will improve Outcome: Progressing Patient is receiving PRN tramadol which she says has been effective in managing her surgical pain as well as her fibromyalgia pain.   Problem: Skin Integrity: Goal: Demonstration of wound healing without infection will improve Outcome: Progressing Patient has four incisions on her abdomen which are closed with skin glue. All incisions are dry and intact with no drainage or signs of infection.

## 2015-09-14 NOTE — Discharge Instructions (Signed)
Take Miralax 1 cap full 1-2 times daily as needed to help with BMs.  Titrate frequency to soft but not watery. Take Colace 1 pill 1-2 times daily as needed to help with BMs.  Titrate frequency to soft but not watery.      Your appointment is at 10:15am on 10/05/15, please arrive at least before your appointment to complete your check in paperwork.  If you are unable to arrive prior to your appointment time we may have to cancel or reschedule you.  LAPAROSCOPIC SURGERY: POST OP INSTRUCTIONS  1. DIET: Follow a light bland diet the first 24 hours after arrival home, such as soup, liquids, crackers, etc. Be sure to include lots of fluids daily. Avoid fast food or heavy meals as your are more likely to get nauseated. Eat a low fat the next few days after surgery.  2. Take your usually prescribed home medications unless otherwise directed. 3. PAIN CONTROL:  1. Pain is best controlled by a usual combination of three different methods TOGETHER:  1. Ice/Heat 2. Over the counter pain medication 3. Prescription pain medication 2. Most patients will experience some swelling and bruising around the incisions. Ice packs or heating pads (30-60 minutes up to 6 times a day) will help. Use ice for the first few days to help decrease swelling and bruising, then switch to heat to help relax tight/sore spots and speed recovery. Some people prefer to use ice alone, heat alone, alternating between ice & heat. Experiment to what works for you. Swelling and bruising can take several weeks to resolve.  3. It is helpful to take an over-the-counter pain medication regularly for the first few weeks. Choose one of the following that works best for you:  1. Naproxen (Aleve, etc) Two  tabs twice a day 2. Ibuprofen (Advil, etc) Three  tabs four times a day (every meal & bedtime) 3. Acetaminophen (Tylenol, etc) 500-650mg  four times a day (every meal & bedtime) 4. A prescription for pain medication (such as  oxycodone, hydrocodone, etc) should be given to you upon discharge. Take your pain medication as prescribed.  1. If you are having problems/concerns with the prescription medicine (does not control pain, nausea, vomiting, rash, itching, etc), please call us [phone] to see if we need to switch you to a different pain medicine that will work better for you and/or control your side effect better. 2. If you need a refill on your pain medication, please contact your pharmacy. They will contact our office to request authorization. Prescriptions will not be filled after 5 pm or on week-ends. 4. Avoid getting constipated. Between the surgery and the pain medications, it is common to experience some constipation. Increasing fluid intake and taking a fiber supplement (such as Metamucil, Citrucel, FiberCon, MiraLax, etc) 1-2 times a day regularly will usually help prevent this problem from occurring. A mild laxative (prune juice, Milk of Magnesia, MiraLax, etc) should be taken according to package directions if there are no bowel movements after 48 hours.  5. Watch out for diarrhea. If you have many loose bowel movements, simplify your diet to bland foods & liquids for a few days. Stop any stool softeners and decrease your fiber supplement. Switching to mild anti-diarrheal medications (Kayopectate, Pepto Bismol) can help. If this worsens or does not improve, please call us. 6. Wash / shower every day. You may shower over the dressings as they are waterproof. Continue to shower over incision(s) after the dressing is off. 7. Remove  your waterproof bandages 5 days after surgery. You may leave the incision open to air. You may replace a dressing/Band-Aid to cover the incision for comfort if you wish.  8. ACTIVITIES as tolerated:  1. You may resume regular (light) daily activities beginning the next day--such as daily self-care, walking, climbing stairs--gradually increasing activities as tolerated. If you can walk  30 minutes without difficulty, it is safe to try more intense activity such as jogging, treadmill, bicycling, low-impact aerobics, swimming, etc. 2. Save the most intensive and strenuous activity for last such as sit-ups, heavy lifting, contact sports, etc Refrain from any heavy lifting or straining until you are off narcotics for pain control.  3. DO NOT PUSH THROUGH PAIN. Let pain be your guide: If it hurts to do something, don't do it. Pain is your body warning you to avoid that activity for another week until the pain goes down. 4. You may drive when you are no longer taking prescription pain medication, you can comfortably wear a seatbelt, and you can safely maneuver your car and apply brakes. 5. You may have sexual intercourse when it is comfortable.  9. FOLLOW UP in our office  1. Please call [facility] at [phone] to set up an appointment to see your surgeon in the office for a follow-up appointment approximately 2-3 weeks after your surgery. 2. Make sure that you call for this appointment the day you arrive home to insure a convenient appointment time.      10. IF YOU HAVE DISABILITY OR FAMILY LEAVE FORMS, BRING THEM TO THE               OFFICE FOR PROCESSING.   WHEN TO CALL us [phone]:  1. Poor pain control 2. Reactions / problems with new medications (rash/itching, nausea, etc)  3. Fever over 101.5 F (38.5 C) 4. Inability to urinate 5. Nausea and/or vomiting 6. Worsening swelling or bruising 7. Continued bleeding from incision. 8. Increased pain, redness, or drainage from the incision  The clinic staff is available to answer your questions during regular business hours (8:30am-5pm). Please dont hesitate to call and ask to speak to one of our nurses for clinical concerns.  If you have a medical emergency, go to the nearest emergency room or call 911.  A surgeon from [facility] is always on call at the [facility], [place]  [address], [place], [place] [address] ?  MAIN: [phone] ? TOLL FREE: [phone] ?  FAX [phone]  [email or web address]

## 2015-09-14 NOTE — Progress Notes (Signed)
Discharge instructions given to patient and family. Patient discharged home. No further questions.

## 2015-09-16 LAB — CULTURE, BLOOD (ROUTINE X 2)
Culture: NO GROWTH
Culture: NO GROWTH

## 2015-11-13 ENCOUNTER — Emergency Department (HOSPITAL_COMMUNITY): Payer: Medicare Other

## 2015-11-13 ENCOUNTER — Encounter (HOSPITAL_COMMUNITY): Payer: Self-pay | Admitting: Emergency Medicine

## 2015-11-13 ENCOUNTER — Observation Stay (HOSPITAL_COMMUNITY)
Admission: EM | Admit: 2015-11-13 | Discharge: 2015-11-15 | Disposition: A | Discharge status: Home / Self Care | Payer: Medicare Other | Attending: Internal Medicine | Admitting: Internal Medicine

## 2015-11-13 DIAGNOSIS — X58XXXA Exposure to other specified factors, initial encounter: Secondary | ICD-10-CM | POA: Diagnosis not present

## 2015-11-13 DIAGNOSIS — R55 Syncope and collapse: Principal | ICD-10-CM | POA: Diagnosis present

## 2015-11-13 DIAGNOSIS — Z87891 Personal history of nicotine dependence: Secondary | ICD-10-CM | POA: Diagnosis not present

## 2015-11-13 DIAGNOSIS — Z79899 Other long term (current) drug therapy: Secondary | ICD-10-CM | POA: Diagnosis not present

## 2015-11-13 DIAGNOSIS — M797 Fibromyalgia: Secondary | ICD-10-CM | POA: Diagnosis present

## 2015-11-13 DIAGNOSIS — S01512A Laceration without foreign body of oral cavity, initial encounter: Secondary | ICD-10-CM | POA: Diagnosis not present

## 2015-11-13 DIAGNOSIS — R569 Unspecified convulsions: Secondary | ICD-10-CM | POA: Insufficient documentation

## 2015-11-13 DIAGNOSIS — R9431 Abnormal electrocardiogram [ECG] [EKG]: Secondary | ICD-10-CM

## 2015-11-13 DIAGNOSIS — R4182 Altered mental status, unspecified: Secondary | ICD-10-CM | POA: Insufficient documentation

## 2015-11-13 DIAGNOSIS — K920 Hematemesis: Secondary | ICD-10-CM

## 2015-11-13 DIAGNOSIS — E119 Type 2 diabetes mellitus without complications: Secondary | ICD-10-CM | POA: Insufficient documentation

## 2015-11-13 DIAGNOSIS — I10 Essential (primary) hypertension: Secondary | ICD-10-CM | POA: Diagnosis not present

## 2015-11-13 DIAGNOSIS — K219 Gastro-esophageal reflux disease without esophagitis: Secondary | ICD-10-CM | POA: Diagnosis present

## 2015-11-13 LAB — URINALYSIS, ROUTINE W REFLEX MICROSCOPIC
Bilirubin Urine: NEGATIVE
GLUCOSE, UA: NEGATIVE mg/dL
Hgb urine dipstick: NEGATIVE
KETONES UR: NEGATIVE mg/dL
LEUKOCYTES UA: NEGATIVE
Nitrite: NEGATIVE
PH: 5 (ref 5.0–8.0)
Protein, ur: NEGATIVE mg/dL
SPECIFIC GRAVITY, URINE: 1.022 (ref 1.005–1.030)

## 2015-11-13 LAB — CBC
HEMATOCRIT: 41 % (ref 36.0–46.0)
HEMOGLOBIN: 14.1 g/dL (ref 12.0–15.0)
MCH: 30.1 pg (ref 26.0–34.0)
MCHC: 34.4 g/dL (ref 30.0–36.0)
MCV: 87.6 fL (ref 78.0–100.0)
Platelets: 169 10*3/uL (ref 150–400)
RBC: 4.68 MIL/uL (ref 3.87–5.11)
RDW: 13.4 % (ref 11.5–15.5)
WBC: 16.4 10*3/uL — AB (ref 4.0–10.5)

## 2015-11-13 LAB — BASIC METABOLIC PANEL
Anion gap: 9 (ref 5–15)
BUN: 17 mg/dL (ref 6–20)
CHLORIDE: 106 mmol/L (ref 101–111)
CO2: 25 mmol/L (ref 22–32)
Calcium: 9.6 mg/dL (ref 8.9–10.3)
Creatinine, Ser: 0.8 mg/dL (ref 0.44–1.00)
GFR calc Af Amer: >60 mL/min (ref >60)
GFR calc non Af Amer: >60 mL/min (ref >60)
Glucose, Bld: 121 mg/dL — ABNORMAL HIGH (ref 65–99)
POTASSIUM: 3.9 mmol/L (ref 3.5–5.1)
SODIUM: 140 mmol/L (ref 135–145)

## 2015-11-13 LAB — TROPONIN I

## 2015-11-13 LAB — CBG MONITORING, ED: Glucose-Capillary: 109 mg/dL — ABNORMAL HIGH (ref 65–99)

## 2015-11-13 MED ORDER — ONDANSETRON HCL 4 MG PO TABS: 4.0000 mg | ORAL_TABLET | Freq: Four times a day (QID) | ORAL | Status: DC | PRN

## 2015-11-13 MED ORDER — DEXTROSE-NACL 5-0.45 % IV SOLN
INTRAVENOUS | Status: DC
  Administered 2015-11-14: 02:00:00 via INTRAVENOUS

## 2015-11-13 MED ORDER — KETOROLAC TROMETHAMINE 10 MG PO TABS
10.0000 mg | ORAL_TABLET | Freq: Four times a day (QID) | ORAL | Status: DC | PRN
  Administered 2015-11-14 – 2015-11-15 (×4): 10 mg via ORAL
  Filled 2015-11-13 (×6): qty 1

## 2015-11-13 MED ORDER — ASPIRIN EC 81 MG PO TBEC
81.0000 mg | DELAYED_RELEASE_TABLET | Freq: Every day | ORAL | Status: DC
  Administered 2015-11-14 – 2015-11-15 (×3): 81 mg via ORAL
  Filled 2015-11-13 (×3): qty 1

## 2015-11-13 MED ORDER — ALUM & MAG HYDROXIDE-SIMETH 200-200-20 MG/5ML PO SUSP: 30.0000 mL | Freq: Four times a day (QID) | ORAL | Status: DC | PRN

## 2015-11-13 MED ORDER — PANTOPRAZOLE SODIUM 40 MG IV SOLR
40.0000 mg | INTRAVENOUS | Status: DC
  Administered 2015-11-14 (×2): 40 mg via INTRAVENOUS
  Filled 2015-11-13 (×2): qty 40

## 2015-11-13 MED ORDER — TRAMADOL HCL 50 MG PO TABS
50.0000 mg | ORAL_TABLET | Freq: Four times a day (QID) | ORAL | Status: DC | PRN
  Administered 2015-11-14: 50 mg via ORAL
  Filled 2015-11-13: qty 1

## 2015-11-13 MED ORDER — ENOXAPARIN SODIUM 40 MG/0.4ML ~~LOC~~ SOLN
40.0000 mg | SUBCUTANEOUS | Status: DC
  Administered 2015-11-14: 40 mg via SUBCUTANEOUS
  Filled 2015-11-13: qty 0.4

## 2015-11-13 MED ORDER — SODIUM CHLORIDE 0.9% FLUSH
3.0000 mL | Freq: Two times a day (BID) | INTRAVENOUS | Status: DC
  Administered 2015-11-14: 3 mL via INTRAVENOUS

## 2015-11-13 MED ORDER — ONDANSETRON HCL 4 MG/2ML IJ SOLN
4.0000 mg | Freq: Once | INTRAMUSCULAR | Status: DC
  Filled 2015-11-13: qty 2

## 2015-11-13 MED ORDER — ASPIRIN 325 MG PO TABS
325.0000 mg | ORAL_TABLET | Freq: Once | ORAL | Status: DC
Start: 2015-11-13 — End: 2015-11-13

## 2015-11-13 MED ORDER — POLYETHYLENE GLYCOL 3350 17 G PO PACK: 17.0000 g | PACK | Freq: Every day | ORAL | Status: DC | PRN

## 2015-11-13 MED ORDER — ALBUTEROL 90 MCG/ACT IN AERS: 2.0000 | INHALATION_SPRAY | RESPIRATORY_TRACT | Status: DC | PRN

## 2015-11-13 MED ORDER — LISINOPRIL 20 MG PO TABS
20.0000 mg | ORAL_TABLET | Freq: Every day | ORAL | Status: DC
Start: 2015-11-14 — End: 2015-11-15
  Administered 2015-11-14 – 2015-11-15 (×2): 20 mg via ORAL
  Filled 2015-11-13 (×2): qty 1

## 2015-11-13 MED ORDER — ONDANSETRON HCL 4 MG/2ML IJ SOLN: 4.0000 mg | Freq: Four times a day (QID) | INTRAMUSCULAR | Status: DC | PRN

## 2015-11-13 MED ORDER — METOCLOPRAMIDE HCL 5 MG/ML IJ SOLN
10.0000 mg | Freq: Once | INTRAMUSCULAR | Status: DC
  Filled 2015-11-13: qty 2

## 2015-11-13 NOTE — H&P (Signed)
History and Physical  Tamara Chambers ZOX:[id number] DOB: Mar 07, 1951 DOA: 11/13/2015  PCP:  Kaleen Mask, MD   Chief Complaint:  Syncope   History of Present Illness:  Patient is a 65 yo female with history of fibromyalgia, HTN, cholecystectomy who came with cc of fall due to syncope/LOC while she was showering in the bathroom. She woke up, realized that she fell down, called her mom to be taken to the ER. She said she has been working the whole day and she was stressed out about her brother yesterday too but she was not feeling any chest pain, dizziness, vertigo, slurred speech, focal motor or sensory deficits or any other symptoms prior to that event. Once she woke up she was not confused but she started vomiting and saw blood in her vomitus then realized that she bit her tongue and it was bleeding. She said she has had feeling of chest discomfort /pressure for the past few weeks, feelings of "heart is moving a little" but no palpitations or skipping beats. She has history of a seizure 10 years ago and was evaluated by neuro. She woke up in Jan 2017 with bitten tongue that she did not know how she got but assumed she did in her sleep. She had LHC 10 years ago at [facility] but she can't remember the results: she was told it was "fine".   Review of Systems:  CONSTITUTIONAL:     No night sweats.  No fatigue.  No fever. No chills. Eyes:                            No visual changes.  No eye pain.  No eye discharge.   ENT:                              No epistaxis.  No sinus pain.  No sore throat.   No congestion. RESPIRATORY:           No cough.  No wheeze.  No hemoptysis.  No dyspnea CARDIOVASCULAR   :  No chest pains.  No palpitations. GASTROINTESTINAL:  No abdominal pain.  +nausea. +vomiting.  No diarrhea. Noconstipation.  No hematemesis.  No hematochezia.  No melena. GENITOURINARY:      No urgency.  No frequency.  No dysuria.  No hematuria.  No obstructive symptoms.  No  discharge.  No pain.  MUSCULOSKELETAL:  No musculoskeletal pain.  No joint swelling.  No arthritis. NEUROLOGICAL:        No confusion.  No weakness. No headache. No seizure. PSYCHIATRIC:             No depression. No anxiety. No suicidal ideation. SKIN:                             No rashes.  No lesions.  +wounds. ENDOCRINE:                No weight loss.  No polydipsia.  No polyuria.  No polyphagia. HEMATOLOGIC:           No purpura.  No petechiae.  No bleeding.  ALLERGIC                 : No pruritus.  No angioedema Other:  Past Medical and Surgical History:   Past Medical History  Diagnosis Date  . Anemia   .  Anxiety   . Arthritis   . Diabetes mellitus   . Emphysema of lung (HCC)   . GERD (gastroesophageal reflux disease)   . Hyperlipidemia   . Hypertension   . Neuromuscular disorder (HCC)   . Seizures (HCC)   . Thyroid disease    Past Surgical History  Procedure Laterality Date  . Tonsilectomy, adenoidectomy, bilateral myringotomy and tubes  1968  . Cesarean section  1984  . Abdominal hysterectomy  1992    partial  . Oophorectomy  2004  . Femur distal locking screw insertion  2004    left foot  . Laparoscopic nissen fundoplication  05/15/11    lap nissen and niatal hernia   . Ercp N/A 09/12/2015    Procedure: ENDOSCOPIC RETROGRADE CHOLANGIOPANCREATOGRAPHY (ERCP);  Surgeon: Dorena CookeyJohn Hayes, MD;  [facility];  Service: Endoscopy;  Laterality: N/A;  . Cholecystectomy N/A 09/13/2015    Procedure: LAPAROSCOPIC CHOLECYSTECTOMY;  Surgeon: Harriette Bouillonhomas Cornett, MD;  Location: [facility] OR;  Service: General;  Laterality: N/A;    Social History:   reports that she quit smoking about 11 years ago. She has never used smokeless tobacco. She reports that she does not drink alcohol or use illicit drugs.    Allergies  Allergen Reactions  . Divalproex Sodium Anaphylaxis  . Codeine Other (See Comments)    Patient cannot remember reaction at this time.  . Neurontin [Gabapentin] Other  (See Comments)    Cannot remember reaction.  . Wellbutrin [Bupropion Hcl] Other (See Comments)    seizure    Family History  Problem Relation Age of Onset  . Cancer Mother     breast  . Cirrhosis Father   . Heart failure Mother       Prior to Admission medications   Medication Sig Start Date End Date Taking? Authorizing Provider  ketorolac (TORADOL) 10 MG tablet Take 10 mg by mouth every 6 (six) hours as needed for moderate pain or severe pain.   Yes Historical Provider, MD  lisinopril (PRINIVIL,ZESTRIL) 20 MG tablet Take 20 mg by mouth daily.   Yes Historical Provider, MD  traMADol (ULTRAM) 50 MG tablet Take 50 mg by mouth every 6 (six) hours as needed for moderate pain.    Yes Historical Provider, MD  albuterol (PROVENTIL,VENTOLIN) 90 MCG/ACT inhaler Inhale 2 puffs into the lungs as needed.      Historical Provider, MD  cholestyramine light (PREVALITE) 4 g packet Take 1 packet (4 g total) by mouth daily. Patient not taking: Reported on 11/13/2015 09/14/15   Jana HalfNicholas A Taylor, MD  docusate sodium (COLACE) 100 MG capsule Take 1 capsule (100 mg total) by mouth daily. Patient not taking: Reported on 11/13/2015 09/14/15   Jana HalfNicholas A Taylor, MD  polyethylene glycol [facility](MIRALAX / Ethelene HalGLYCOLAX) packet Take 17 g by mouth daily. Patient taking differently: Take 17 g by mouth daily as needed for mild constipation.  09/14/15   Jana HalfNicholas A Taylor, MD    Physical Exam: BP 126/61 mmHg  Pulse 76  Temp(Src) 97.6 F (36.4 C) (Oral)  Resp 16  SpO2 100%  GENERAL :   Alert and cooperative, and appears to be in no acute distress. HEAD:           normocephalic. EYES:            PERRL, EOMI.  vision is grossly intact. EARS:           hearing grossly intact. NOSE:           No nasal discharge.  THROAT:      Tongue with laceration ; no bleeding on exam.  NECK:          supple, non-tender.  CARDIAC:    Normal S1 and S2. No gallop. No murmurs.  Vascular:     no peripheral edema.Marland Kitchen LUNGS:       Clear to  auscultation  ABDOMEN: Positive bowel sounds. Soft, nondistended, nontender. No guarding or rebound.      MSK:           No joint erythema or tenderness.  EXT           : No significant deformity or joint abnormality. Neuro        : Alert, oriented to person, place, and time.                      CN II-XII intact.                       Strength and sensation symmetric and intact throughout.                     SKIN:            No rash. No lesions. PSYCH:       No hallucination. Patient is not suicidal.          Labs on Admission:  Reviewed.   Radiological Exams on Admission: Dg Chest 2 View  11/13/2015  CLINICAL DATA:  Patient passed out in the shower. Congestion and chest discomfort. EXAM: CHEST  2 VIEW COMPARISON:  Chest radiograph 09/11/2015 FINDINGS: Monitoring leads overlie the patient. Stable cardiac and mediastinal contours. Elevation of the right hemidiaphragm. No consolidative pulmonary opacities. No pleural effusion or pneumothorax. Thoracic spine degenerative changes. IMPRESSION: No active cardiopulmonary disease. Electronically Signed   By: Annia Belt M.D.   On: 11/13/2015 20:35   Ct Head Wo Contrast  11/13/2015  CLINICAL DATA:  Patient with syncopal episode while in the shower. Dizziness. Patient hit their head on the toilet. EXAM: CT HEAD WITHOUT CONTRAST TECHNIQUE: Contiguous axial images were obtained from the base of the skull through the vertex without intravenous contrast. COMPARISON:  Brain CT 03/30/2015 FINDINGS: Ventricles and sulci are appropriate for patient's age. No evidence for acute cortically based infarct, intracranial hemorrhage, mass lesion or mass-effect. Orbits are unremarkable. Scalp soft tissues are unremarkable. Paranasal sinuses are well aerated. Mastoid air cells are unremarkable. Calvarium is intact. IMPRESSION: No acute intracranial process. Electronically Signed   By: Annia Belt M.D.   On: 11/13/2015 20:39    EKG:  Independently reviewed.  NSR  Assessment/Plan  Syncope:   DDx:  - Cardiac : structural heart disease ( AS, MS) or arrhythmia ( tachy or brady)    Get Echo. Keep on Tele. Rule out ACS with serial trops. Consult to cardio in am.    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 Stroke/. TIA ( less likely with no focal deficits)    CT head: unremarkable.     Seizure precautions and EEG in am  - Vasovagal .  Vomiting blood is not likely  to be hematemesis but more likely to be blood from her tongue.  Will keep on PPI and monitor Hb. Heme occult is neg  HTN: cont home meds  Fibromyalgia: cont home meds.   DVT prophylaxis: [place] enoxaparin Consultants: cardio in am. Code Status: Full      Eston Esters M.D [facility]

## 2015-11-13 NOTE — ED Notes (Signed)
Per [facility] pt had syncopal episode while showering, sts felt dizzy and off balance prior to syncope. pt woke up on floor, hit head against toilet , forehead injury per pt. Per EMS pt was dressed and sitting on chair upon arrival. Pt alert and oriented x 4. Also reports emesis , pt has tongue laceration bleeding at this time . Pt denies chest pain nor shortness of breath . Pt also denies Hx seizures yet reports has one seizure years ago and not on meds . Hx HTN, gastric ulcere, diverticulitis. EKG unremarkable per EMS. Pt received 4 mg Zofran IV prior to arrival.

## 2015-11-13 NOTE — ED Notes (Signed)
Bed: [id number] Expected date: 11/13/15 Expected time: 6:03 PM Means of arrival:  Comments: Syncopal episode

## 2015-11-13 NOTE — ED Provider Notes (Signed)
CSN: [id number]     Arrival date & time 11/13/15  1803 History   First MD Initiated Contact with Patient 11/13/15 1850     Chief Complaint  Patient presents with  . Loss of Consciousness     (Consider location/radiation/quality/duration/timing/severity/associated sxs/prior Treatment) Patient is a 65 y.o. female presenting with syncope. The history is provided by the patient. No language interpreter was used.  Loss of Consciousness Episode history:  Single Most recent episode:  Today Timing:  Constant Progression:  Worsening Chronicity:  New Context: standing up   Witnessed: no   Relieved by:  Nothing Worsened by:  Nothing tried Ineffective treatments:  None tried Associated symptoms: headaches, nausea, recent fall and vomiting   Associated symptoms: no chest pain   Vomiting:    Quality:  Bright red blood, stomach contents and undigested food   Number of occurrences:  Multiple   Severity:  Severe   Duration:  1 hour   Timing:  Constant   Progression:  Improving Risk factors: no coronary artery disease   Pt reports she was in the shower washing her hair and she began feeling light headed.  Pt reports she put her hand on the wall, Pt reports she was conditioning her hair and then passed out.  Pt bit her tongue and had a large amount of bleeding.  Pt reports she vomited a large amount of blood and food.  Pt reports she has had a bleeding ulcer in the past. Pt reports in January she had an episode of chest pain and was given nitroglycerin.  Pt had a tongue laceration after surgery in February.  Pt had her Gallbladder removed.    Past Medical History  Diagnosis Date  . Anemia   . Anxiety   . Arthritis   . Diabetes mellitus   . Emphysema of lung (HCC)   . GERD (gastroesophageal reflux disease)   . Hyperlipidemia   . Hypertension   . Neuromuscular disorder (HCC)   . Seizures (HCC)   . Thyroid disease    Past Surgical History  Procedure Laterality Date  . Tonsilectomy,  adenoidectomy, bilateral myringotomy and tubes  1968  . Cesarean section  1984  . Abdominal hysterectomy  1992    partial  . Oophorectomy  2004  . Femur distal locking screw insertion  2004    left foot  . Laparoscopic nissen fundoplication  05/15/11    lap nissen and niatal hernia   . Ercp N/A 09/12/2015    Procedure: ENDOSCOPIC RETROGRADE CHOLANGIOPANCREATOGRAPHY (ERCP);  Surgeon: Dorena Cookey, MD;  Location: [facility] ENDOSCOPY;  Service: Endoscopy;  Laterality: N/A;  . Cholecystectomy N/A 09/13/2015    Procedure: LAPAROSCOPIC CHOLECYSTECTOMY;  Surgeon: Harriette Bouillon, MD;  Location: [facility] OR;  Service: General;  Laterality: N/A;   Family History  Problem Relation Age of Onset  . Cancer Mother     breast  . Cirrhosis Father   . Heart failure Mother    Social History  Substance Use Topics  . Smoking status: Former Smoker    Quit date: 04/26/2004  . Smokeless tobacco: Never Used  . Alcohol Use: No   OB History    No data available     Review of Systems  Cardiovascular: Positive for syncope. Negative for chest pain.  Gastrointestinal: Positive for nausea and vomiting.  Neurological: Positive for headaches.  All other systems reviewed and are negative.     Allergies  Divalproex sodium; Codeine; Neurontin; and Wellbutrin  Home Medications   Prior to Admission  medications   Medication Sig Start Date End Date Taking? Authorizing Provider  ketorolac (TORADOL) 10 MG tablet Take 10 mg by mouth every 6 (six) hours as needed for moderate pain or severe pain.   Yes Historical Provider, MD  lisinopril (PRINIVIL,ZESTRIL) 20 MG tablet Take 20 mg by mouth daily.   Yes Historical Provider, MD  traMADol (ULTRAM) 50 MG tablet Take 50 mg by mouth every 6 (six) hours as needed for moderate pain.    Yes Historical Provider, MD  albuterol (PROVENTIL,VENTOLIN) 90 MCG/ACT inhaler Inhale 2 puffs into the lungs as needed.      Historical Provider, MD  cholestyramine light (PREVALITE) 4 g packet Take 1  packet (4 g total) by mouth daily. Patient not taking: Reported on 11/13/2015 09/14/15   Jana HalfNicholas A Taylor, MD  docusate sodium (COLACE) 100 MG capsule Take 1 capsule (100 mg total) by mouth daily. Patient not taking: Reported on 11/13/2015 09/14/15   Jana HalfNicholas A Taylor, MD  polyethylene glycol [facility](MIRALAX / Ethelene HalGLYCOLAX) packet Take 17 g by mouth daily. Patient taking differently: Take 17 g by mouth daily as needed for mild constipation.  09/14/15   Jana HalfNicholas A Taylor, MD   BP 126/61 mmHg  Pulse 74  Temp(Src) 97.6 F (36.4 C) (Oral)  Resp 18  SpO2 97% Physical Exam  Constitutional: She is oriented to person, place, and time. She appears well-developed and well-nourished.  HENT:  Head: Normocephalic.  Right Ear: External ear normal.  Left Ear: External ear normal.  Eyes: Conjunctivae and EOM are normal. Pupils are equal, round, and reactive to light.  Neck: Normal range of motion.  Cardiovascular: Normal rate and normal heart sounds.   Pulmonary/Chest: Effort normal.  Abdominal: Soft. She exhibits no distension.  Musculoskeletal: Normal range of motion.  Neurological: She is alert and oriented to person, place, and time.  Skin: Skin is warm.  Psychiatric: She has a normal mood and affect.  Nursing note and vitals reviewed.   ED Course  Procedures (including critical care time) Labs Review Labs Reviewed  BASIC METABOLIC PANEL - Abnormal; Notable for the following:    Glucose, Bld 121 (*)    All other components within normal limits  CBC - Abnormal; Notable for the following:    WBC 16.4 (*)    All other components within normal limits  CBG MONITORING, ED - Abnormal; Notable for the following:    Glucose-Capillary 109 (*)    All other components within normal limits  URINALYSIS, ROUTINE W REFLEX MICROSCOPIC (NOT AT [facility])  TROPONIN I    Imaging Review Dg Chest 2 View  11/13/2015  CLINICAL DATA:  Patient passed out in the shower. Congestion and chest discomfort. EXAM: CHEST  2 VIEW  COMPARISON:  Chest radiograph 09/11/2015 FINDINGS: Monitoring leads overlie the patient. Stable cardiac and mediastinal contours. Elevation of the right hemidiaphragm. No consolidative pulmonary opacities. No pleural effusion or pneumothorax. Thoracic spine degenerative changes. IMPRESSION: No active cardiopulmonary disease. Electronically Signed   By: Annia Beltrew  Davis M.D.   On: 11/13/2015 20:35   Ct Head Wo Contrast  11/13/2015  CLINICAL DATA:  Patient with syncopal episode while in the shower. Dizziness. Patient hit their head on the toilet. EXAM: CT HEAD WITHOUT CONTRAST TECHNIQUE: Contiguous axial images were obtained from the base of the skull through the vertex without intravenous contrast. COMPARISON:  Brain CT 03/30/2015 FINDINGS: Ventricles and sulci are appropriate for patient's age. No evidence for acute cortically based infarct, intracranial hemorrhage, mass lesion or mass-effect. Orbits are  unremarkable. Scalp soft tissues are unremarkable. Paranasal sinuses are well aerated. Mastoid air cells are unremarkable. Calvarium is intact. IMPRESSION: No acute intracranial process. Electronically Signed   By: Annia Belt M.D.   On: 11/13/2015 20:39   I have personally reviewed and evaluated these images and lab results as part of my medical decision-making.   EKG Interpretation   Date/Time:  Sunday November 13 2015 19:34:02 EDT Ventricular Rate:  92 PR Interval:  160 QRS Duration: 102 QT Interval:  364 QTC Calculation: 450 R Axis:   51 Text Interpretation:  Sinus rhythm Borderline T abnormalities, anterior  leads TWI in anterior leads, new since last EKG 06/04/2002  Confirmed by  LIU MD, DANA ([id number]) on 11/13/2015 7:48:25 PM      MDM hemocult negative.  I suspect vomitted blood was from tongue laceration.  I will consult hospitalist for admission for evaluation of syncope.   Final diagnoses:  Syncope, unspecified syncope type  Tongue laceration, initial encounter  Hematemesis with nausea   Nonspecific abnormal electrocardiogram (ECG) (EKG)       Elson Areas, PA-C 11/13/15 2303  Lavera Guise, MD 11/14/15 [phone]

## 2015-11-14 ENCOUNTER — Observation Stay (HOSPITAL_COMMUNITY): Payer: Medicare Other

## 2015-11-14 ENCOUNTER — Observation Stay (HOSPITAL_COMMUNITY)
Admit: 2015-11-14 | Discharge: 2015-11-14 | Disposition: A | Discharge status: Home / Self Care | Payer: Medicare Other | Attending: Allergy & Immunology | Admitting: Allergy & Immunology

## 2015-11-14 ENCOUNTER — Observation Stay (HOSPITAL_BASED_OUTPATIENT_CLINIC_OR_DEPARTMENT_OTHER): Payer: Medicare Other

## 2015-11-14 DIAGNOSIS — R55 Syncope and collapse: Secondary | ICD-10-CM

## 2015-11-14 DIAGNOSIS — S01512A Laceration without foreign body of oral cavity, initial encounter: Secondary | ICD-10-CM

## 2015-11-14 DIAGNOSIS — R569 Unspecified convulsions: Secondary | ICD-10-CM | POA: Diagnosis not present

## 2015-11-14 DIAGNOSIS — K920 Hematemesis: Secondary | ICD-10-CM

## 2015-11-14 DIAGNOSIS — R4182 Altered mental status, unspecified: Secondary | ICD-10-CM

## 2015-11-14 DIAGNOSIS — I10 Essential (primary) hypertension: Secondary | ICD-10-CM | POA: Diagnosis not present

## 2015-11-14 LAB — RAPID URINE DRUG SCREEN, HOSP PERFORMED
AMPHETAMINES: NOT DETECTED
Barbiturates: NOT DETECTED
Benzodiazepines: NOT DETECTED
COCAINE: NOT DETECTED
OPIATES: NOT DETECTED
TETRAHYDROCANNABINOL: NOT DETECTED

## 2015-11-14 LAB — CBC WITH DIFFERENTIAL/PLATELET
BASOS ABS: 0 10*3/uL (ref 0.0–0.1)
BASOS PCT: 0 %
EOS ABS: 0.1 10*3/uL (ref 0.0–0.7)
EOS PCT: 1 %
HCT: 38 % (ref 36.0–46.0)
Hemoglobin: 12.7 g/dL (ref 12.0–15.0)
LYMPHS PCT: 33 %
Lymphs Abs: 3.6 10*3/uL (ref 0.7–4.0)
MCH: 30.1 pg (ref 26.0–34.0)
MCHC: 33.4 g/dL (ref 30.0–36.0)
MCV: 90 fL (ref 78.0–100.0)
Monocytes Absolute: 0.7 10*3/uL (ref 0.1–1.0)
Monocytes Relative: 6 %
Neutro Abs: 6.7 10*3/uL (ref 1.7–7.7)
Neutrophils Relative %: 60 %
PLATELETS: 183 10*3/uL (ref 150–400)
RBC: 4.22 MIL/uL (ref 3.87–5.11)
RDW: 13.7 % (ref 11.5–15.5)
WBC: 11.2 10*3/uL — AB (ref 4.0–10.5)

## 2015-11-14 LAB — ECHOCARDIOGRAM COMPLETE
HEIGHTINCHES: 67 in
Weight: 3432.12 oz

## 2015-11-14 LAB — MRSA PCR SCREENING: MRSA by PCR: NEGATIVE

## 2015-11-14 LAB — COMPREHENSIVE METABOLIC PANEL
ALT: 32 U/L (ref 14–54)
AST: 37 U/L (ref 15–41)
Albumin: 4.1 g/dL (ref 3.5–5.0)
Alkaline Phosphatase: 88 U/L (ref 38–126)
Anion gap: 10 (ref 5–15)
BUN: 16 mg/dL (ref 6–20)
CALCIUM: 9.1 mg/dL (ref 8.9–10.3)
CHLORIDE: 105 mmol/L (ref 101–111)
CO2: 25 mmol/L (ref 22–32)
Creatinine, Ser: 0.88 mg/dL (ref 0.44–1.00)
GFR calc Af Amer: >60 mL/min (ref >60)
Glucose, Bld: 118 mg/dL — ABNORMAL HIGH (ref 65–99)
POTASSIUM: 3.9 mmol/L (ref 3.5–5.1)
SODIUM: 140 mmol/L (ref 135–145)
TOTAL PROTEIN: 7.1 g/dL (ref 6.5–8.1)
Total Bilirubin: 1.8 mg/dL — ABNORMAL HIGH (ref 0.3–1.2)

## 2015-11-14 LAB — TROPONIN I
Troponin I: <0.03 ng/mL (ref <0.031)
Troponin I: <0.03 ng/mL (ref <0.031)
Troponin I: <0.03 ng/mL (ref <0.031)

## 2015-11-14 LAB — GLUCOSE, CAPILLARY: Glucose-Capillary: 123 mg/dL — ABNORMAL HIGH (ref 65–99)

## 2015-11-14 LAB — PHOSPHORUS: Phosphorus: 4.6 mg/dL (ref 2.5–4.6)

## 2015-11-14 LAB — MAGNESIUM: Magnesium: 2.1 mg/dL (ref 1.7–2.4)

## 2015-11-14 MED ORDER — ONDANSETRON HCL 4 MG/2ML IJ SOLN: 4.0000 mg | Freq: Three times a day (TID) | INTRAMUSCULAR | Status: DC | PRN

## 2015-11-14 MED ORDER — LORAZEPAM 2 MG/ML IJ SOLN
2.0000 mg | Freq: Once | INTRAMUSCULAR | Status: AC
  Administered 2015-11-14: 2 mg via INTRAVENOUS
  Filled 2015-11-14: qty 1

## 2015-11-14 MED ORDER — LORAZEPAM 2 MG/ML IJ SOLN: 1.0000 mg | Freq: Four times a day (QID) | INTRAMUSCULAR | Status: DC | PRN

## 2015-11-14 MED ORDER — ALBUTEROL SULFATE (2.5 MG/3ML) 0.083% IN NEBU: 2.5000 mg | INHALATION_SOLUTION | RESPIRATORY_TRACT | Status: DC | PRN

## 2015-11-14 MED ORDER — CYCLOBENZAPRINE HCL 5 MG PO TABS
5.0000 mg | ORAL_TABLET | Freq: Once | ORAL | Status: AC
  Administered 2015-11-14: 5 mg via ORAL
  Filled 2015-11-14: qty 1

## 2015-11-14 NOTE — Progress Notes (Signed)
   11/14/15 1548  PT Time Calculation  PT Start Time (ACUTE ONLY) 1521  PT Stop Time (ACUTE ONLY) 1529  PT Time Calculation (min) (ACUTE ONLY) 8 min  PT G-Codes **NOT FOR INPATIENT CLASS**  Functional Assessment Tool Used clinical judgement  Functional Limitation Mobility: Walking and moving around  Mobility: Walking and Moving Around Current Status (W0981(G8978) CI  Mobility: Walking and Moving Around Goal Status (X9147(G8979) CI  PT General Charges  $$ ACUTE PT VISIT 1 Procedure  PT Evaluation  $PT Eval Low Complexity 1 Procedure   Rebeca AlertJannie Elmore Hyslop, MPT [phone]

## 2015-11-14 NOTE — Progress Notes (Signed)
EEG Completed; Results Pending  

## 2015-11-14 NOTE — Care Management Obs Status (Signed)
MEDICARE OBSERVATION STATUS NOTIFICATION   Patient Details  Name: Tamara ElizabethDebra J Croft MRN: [id number] Date of Birth: May 16, 1951   Medicare Observation Status Notification Given:  Yes    Geni BersMcGibboney, [place], RN 11/14/2015, 4:23 PM

## 2015-11-14 NOTE — Procedures (Signed)
History: Tamara Chambers is an 65 y.o. female patient with altered mental status and syncope. Routine inpatient EEG was performed for further evaluation.   Patient Active Problem List   Diagnosis Date Noted  . Hematemesis with nausea 11/14/2015  . Tongue laceration 11/14/2015  . Syncope 11/13/2015  . Acute cholecystitis 09/12/2015  . Choledocholithiasis with acute cholecystitis   . Chest pain 09/11/2015  . HTN (hypertension) 09/11/2015  . Nausea and vomiting 09/11/2015  . Fibromyalgia 09/11/2015  . T2DM (type 2 diabetes mellitus) (HCC) 09/11/2015  . GERD (gastroesophageal reflux disease) 09/11/2015  . History of tobacco abuse 09/11/2015  . History of repair of hiatal hernia 09/11/2015  . Hyperlipidemia 09/11/2015  . Acute calculous cholecystitis 09/11/2015     Current facility-administered medications:  .  albuterol (PROVENTIL) (2.5 MG/3ML) 0.083% nebulizer solution 2.5 mg, 2.5 mg, Nebulization, Q4H PRN, Eston EstersAhmad Hamad, MD .  alum & mag hydroxide-simeth (MAALOX/MYLANTA) 200-200-20 MG/5ML suspension 30 mL, 30 mL, Oral, Q6H PRN, Eston EstersAhmad Hamad, MD .  aspirin EC tablet 81 mg, 81 mg, Oral, Daily, Eston EstersAhmad Hamad, MD, 81 mg at 11/14/15 0943 .  dextrose 5 %-0.45 % sodium chloride infusion, , Intravenous, Continuous, Eston EstersAhmad Hamad, MD, Last Rate: 75 mL/hr at 11/14/15 0229 .  enoxaparin (LOVENOX) injection 40 mg, 40 mg, Subcutaneous, Q24H, Eston EstersAhmad Hamad, MD, 40 mg at 11/14/15 0944 .  ketorolac (TORADOL) tablet 10 mg, 10 mg, Oral, Q6H PRN, Eston EstersAhmad Hamad, MD, 10 mg at 11/14/15 0235 .  lisinopril (PRINIVIL,ZESTRIL) tablet 20 mg, 20 mg, Oral, Daily, Eston EstersAhmad Hamad, MD, 20 mg at 11/14/15 0943 .  LORazepam (ATIVAN) injection 1 mg, 1 mg, Intravenous, Q6H PRN, Nishant Dhungel, MD .  metoCLOPramide (REGLAN) injection 10 mg, 10 mg, Intravenous, Once, Lavera Guiseana Duo Liu, MD, 10 mg at 11/13/15 1941 .  ondansetron (ZOFRAN) injection 4 mg, 4 mg, Intravenous, Once, [employer] GroupLeslie K Sofia, PA-C, 4 mg at 11/13/15 1940 .  ondansetron  (ZOFRAN) tablet 4 mg, 4 mg, Oral, Q6H PRN **OR** ondansetron (ZOFRAN) injection 4 mg, 4 mg, Intravenous, Q6H PRN, Eston EstersAhmad Hamad, MD .  pantoprazole (PROTONIX) injection 40 mg, 40 mg, Intravenous, Q24H, Eston EstersAhmad Hamad, MD, 40 mg at 11/14/15 0235 .  polyethylene glycol (MIRALAX / GLYCOLAX) packet 17 g, 17 g, Oral, Daily PRN, Eston EstersAhmad Hamad, MD .  sodium chloride flush (NS) 0.9 % injection 3 mL, 3 mL, Intravenous, Q12H, Eston EstersAhmad Hamad, MD, 3 mL at 11/14/15 0230   Introduction:  This is a 19 channel routine scalp EEG performed at the bedside with bipolar and monopolar montages arranged in accordance to the international 10/20 system of electrode placement. One channel was dedicated to EKG recording.   Findings:  The background rhythm was normal 9-10 Hz alpha . No definite evidence of abnormal epileptiform discharges or electrographic seizures were noted during this recording.   Impression:  Unremarkable awake and drowsy routine inpatient EEG. Clinical correlation is recommended .

## 2015-11-14 NOTE — Evaluation (Addendum)
Physical Therapy Evaluation Patient Details Name: Tamara Chambers MRN: [id number] DOB: 04-24-1951 Today's Date: 11/14/2015   History of Present Illness  65 yo female admitted with syncopal episode. Hx of fibromyalgia, HTN, cholecystectomy, DM, NM d/o, emphysema.   Clinical Impression  On eval, pt required Min assist for mobility-walked ~125 feet. [name] and required assist to prevent LOB/fall. Discussed use of walker. Will follow and continue to assess.     Follow Up Recommendations Home health PT;Supervision for OOB/mobility; Intermittent assist    Equipment Recommendations  Rolling walker with 5" wheels (pt may decided to borrow one from family/friends)    Recommendations for Other Services       Precautions / Restrictions Precautions Precautions: Fall Precaution Comments: seizure precautions      Mobility  Bed Mobility Overal bed mobility: Needs Assistance Bed Mobility: Supine to Sit;Sit to Supine     Supine to sit: Supervision Sit to supine: Supervision      Transfers Overall transfer level: Needs assistance   Transfers: Sit to/from Stand Sit to Stand: Min assist         General transfer comment: assist to stabilize.   Ambulation/Gait Ambulation/Gait assistance: Min assist Ambulation Distance (Feet): 125 Feet Assistive device: None Gait Pattern/deviations: Step-through pattern;Drifts right/left     General Gait Details: Unsteady. Assist to prevent LOB/fall. Pt tolerated distance well.   Stairs            Wheelchair Mobility    Modified Rankin (Stroke Patients Only)       Balance Overall balance assessment: Needs assistance;History of Falls         Standing balance support: During functional activity Standing balance-Leahy Scale: Fair                               Pertinent Vitals/Pain Pain Assessment: No/denies pain    Home Living Family/patient expects to be discharged to:: Private residence Living  Arrangements: Alone   Type of Home: House Home Access: Stairs to enter   Secretary/administrator of Steps: 1   Home Equipment: None      Prior Function Level of Independence: Independent               Hand Dominance        Extremity/Trunk Assessment   Upper Extremity Assessment: Overall WFL for tasks assessed           Lower Extremity Assessment: Generalized weakness      Cervical / Trunk Assessment: Normal  Communication   Communication: No difficulties  Cognition Arousal/Alertness: Awake/alert Behavior During Therapy: WFL for tasks assessed/performed Overall Cognitive Status: Within Functional Limits for tasks assessed                      General Comments      Exercises        Assessment/Plan    PT Assessment Patient needs continued PT services  PT Diagnosis Difficulty walking;Abnormality of gait   PT Problem List Decreased balance;Decreased mobility  PT Treatment Interventions Gait training;Functional mobility training;Therapeutic activities;Patient/family education;Balance training;Therapeutic exercise   PT Goals (Current goals can be found in the Care Plan section) Acute Rehab PT Goals Patient Stated Goal: none stated PT Goal Formulation: With patient Time For Goal Achievement: 11/28/15 Potential to Achieve Goals: Good    Frequency Min 3X/week   Barriers to discharge        Co-evaluation  End of Session Equipment Utilized During Treatment: Gait belt Activity Tolerance: Patient tolerated treatment well Patient left: in bed;with call bell/phone within reach;with bed alarm set (with EEG tech in room)           Time: [id number] PT Time Calculation (min) (ACUTE ONLY): 8 min   Charges:   PT Evaluation $PT Eval Low Complexity: 1 Procedure     PT G Codes:        Rebeca AlertJannie Griff Badley, MPT Pager: [phone]

## 2015-11-14 NOTE — Consult Note (Addendum)
CARDIOLOGY CONSULT NOTE   Patient ID: Charna ElizabethDebra J Vereen MRN: [id number] DOB/AGE: 01-22-1951 65 y.o.  Admit date: 11/13/2015  Primary Physician   Kaleen MaskELKINS,WILSON OLIVER, MD Primary Cardiologist   Per 09/12/2015 consult note > Dr Eden EmmsNishan, Dr Jacinto HalimGanji saw remotely Reason for Consultation   syncope  WUJ:[id number]:Parish Wilfred LacyJ Vandekamp is a 65 y.o. year old female with a history of nl cath 2008, HTN, DM, anxiety, GERD, seizures.   Pt seen 08/2015 preop for cholecystectomy, no workup needed.   Pt admitted 04/16 with syncope vs seizure and cardiology asked to evaluate. Neurology also seeing. Ms Mayford KnifeWilliams was in her usual health yesterday. She had been cleaning, and gotten hot. She had eaten breakfast and a slice of pound cake for lunch. She feels she was well-hydrated. No orthostatic symptoms. She went to take a shower because she was hot, and after being in there about 15 minutes, woke up on the floor. There was about a 2 hour gap between the shower and the call to her family. She did not have any prodrome. No chest pain, no presyncope, no palpitations. She has felt well since then.   She gets occasional palpitations, mostly skips. No history or presyncope or syncope from them.   She gets occasional chest pain, relieved with a SL NTG. She has not had either chest pain or palpitations within the last month.   Past Medical History  Diagnosis Date  . Anemia   . Anxiety   . Arthritis   . Diabetes mellitus   . Emphysema of lung (HCC)   . GERD (gastroesophageal reflux disease)   . Hyperlipidemia   . Hypertension   . Neuromuscular disorder (HCC)   . Seizures (HCC)   . Thyroid disease      Past Surgical History  Procedure Laterality Date  . Tonsilectomy, adenoidectomy, bilateral myringotomy and tubes  1968  . Cesarean section  1984  . Abdominal hysterectomy  1992    partial  . Oophorectomy  2004  . Femur distal locking screw insertion  2004    left foot  . Laparoscopic nissen fundoplication   05/15/11    lap nissen and niatal hernia   . Ercp N/A 09/12/2015    Procedure: ENDOSCOPIC RETROGRADE CHOLANGIOPANCREATOGRAPHY (ERCP);  Surgeon: Dorena CookeyJohn Hayes, MD;  [facility];  Service: Endoscopy;  Laterality: N/A;  . Cholecystectomy N/A 09/13/2015    Procedure: LAPAROSCOPIC CHOLECYSTECTOMY;  Surgeon: Harriette Bouillonhomas Cornett, MD;  Location: [facility] OR;  Service: General;  Laterality: N/A;    Allergies  Allergen Reactions  . Divalproex Sodium Anaphylaxis  . Codeine Other (See Comments)    Patient cannot remember reaction at this time.  . Neurontin [Gabapentin] Other (See Comments)    Cannot remember reaction.  . Wellbutrin [Bupropion Hcl] Other (See Comments)    seizure    I have reviewed the patient's current medications . aspirin EC  81 mg Oral Daily  . enoxaparin (LOVENOX) injection  40 mg Subcutaneous Q24H  . lisinopril  20 mg Oral Daily  . metoCLOPramide (REGLAN) injection  10 mg Intravenous Once  . ondansetron  4 mg Intravenous Once  . pantoprazole (PROTONIX) IV  40 mg Intravenous Q24H  . sodium chloride flush  3 mL Intravenous Q12H   . dextrose 5 % and 0.45% NaCl 75 mL/hr at 11/14/15 0229   albuterol, alum & mag hydroxide-simeth, ketorolac, ondansetron **OR** ondansetron (ZOFRAN) IV, polyethylene glycol  Prior to Admission medications   Medication Sig Start Date End Date Taking? Authorizing Provider  ketorolac (TORADOL) 10 MG tablet Take 10 mg by mouth every 6 (six) hours as needed for moderate pain or severe pain.   Yes Historical Provider, MD  lisinopril (PRINIVIL,ZESTRIL) 20 MG tablet Take 20 mg by mouth daily.   Yes Historical Provider, MD  traMADol (ULTRAM) 50 MG tablet Take 50 mg by mouth every 6 (six) hours as needed for moderate pain.    Yes Historical Provider, MD  albuterol (PROVENTIL,VENTOLIN) 90 MCG/ACT inhaler Inhale 2 puffs into the lungs as needed.      Historical Provider, MD  cholestyramine light (PREVALITE) 4 g packet Take 1 packet (4 g total) by mouth  daily. Patient not taking: Reported on 11/13/2015 09/14/15   Jana Half, MD  docusate sodium (COLACE) 100 MG capsule Take 1 capsule (100 mg total) by mouth daily. Patient not taking: Reported on 11/13/2015 09/14/15   Jana Half, MD  polyethylene glycol [facility]) packet Take 17 g by mouth daily. Patient taking differently: Take 17 g by mouth daily as needed for mild constipation.  09/14/15   Jana Half, MD     Social History   Social History  . Marital Status: Divorced    Spouse Name: N/A  . Number of Children: N/A  . Years of Education: N/A   Occupational History  . Not on file.   Social History Main Topics  . Smoking status: Former Smoker    Quit date: 04/26/2004  . Smokeless tobacco: Never Used  . Alcohol Use: No  . Drug Use: No  . Sexual Activity: Not on file   Other Topics Concern  . Not on file   Social History Narrative    Family Status  Relation Status Death Age  . Mother Alive   . Father Deceased    Family History  Problem Relation Age of Onset  . Cancer Mother     breast  . Cirrhosis Father   . Heart failure Mother      ROS:  Full 14 point review of systems complete and found to be negative unless listed above.  Physical Exam: Blood pressure 123/54, pulse 64, temperature 98.1 F (36.7 C), temperature source Oral, resp. rate 16, height  (1.702 m), weight 214 lb 8.1 oz (97.3 kg), SpO2 95 %.  General: Well developed, well nourished, female in no acute distress Head: Eyes PERRLA, No xanthomas.   Normocephalic and atraumatic, oropharynx without edema or exudate. Dentition: good Lungs: clear bilaterally Heart: HRRR S1 S2, no rub/gallop, soft murmur. pulses are 2+ all 4 extrem.   Neck: ?R carotid bruit. No lymphadenopathy.  JVD not elevated. Abdomen: Bowel sounds present, abdomen soft and non-tender without masses or hernias noted. Msk:  No spine or cva tenderness. No weakness, no joint deformities or effusions. Extremities:  No clubbing or cyanosis. No edema.  Neuro: Alert and oriented X 3. No focal deficits noted. Psych:  Good affect, responds appropriately Skin: No rashes or lesions noted.  Labs:   Lab Results  Component Value Date   WBC 11.2* 11/14/2015   HGB 12.7 11/14/2015   HCT 38.0 11/14/2015   MCV 90.0 11/14/2015   PLT 183 11/14/2015    Recent Labs Lab 11/14/15 0644  NA 140  K 3.9  CL 105  CO2 25  BUN 16  CREATININE 0.88  CALCIUM 9.1  PROT 7.1  BILITOT 1.8*  ALKPHOS 88  ALT 32  AST 37  GLUCOSE 118*  ALBUMIN 4.1   MAGNESIUM  Date Value Ref Range  Status  11/14/2015 2.1 1.7 - 2.4 mg/dL Final    Recent Labs  16/10/96 1830 11/14/15 0139 11/14/15 0644  TROPONINI <0.03 <0.03 <0.03   Echo: ordered  ECG:  04/17 SR, borderline T wave abnormalities, anterior leads **no sig arrhythmia on telemetry  Radiology:  Dg Chest 2 View  11/13/2015  CLINICAL DATA:  Patient passed out in the shower. Congestion and chest discomfort. EXAM: CHEST  2 VIEW COMPARISON:  Chest radiograph 09/11/2015 FINDINGS: Monitoring leads overlie the patient. Stable cardiac and mediastinal contours. Elevation of the right hemidiaphragm. No consolidative pulmonary opacities. No pleural effusion or pneumothorax. Thoracic spine degenerative changes. IMPRESSION: No active cardiopulmonary disease. Electronically Signed   By: Annia Belt M.D.   On: 11/13/2015 20:35   Ct Head Wo Contrast  11/13/2015  CLINICAL DATA:  Patient with syncopal episode while in the shower. Dizziness. Patient hit their head on the toilet. EXAM: CT HEAD WITHOUT CONTRAST TECHNIQUE: Contiguous axial images were obtained from the base of the skull through the vertex without intravenous contrast. COMPARISON:  Brain CT 03/30/2015 FINDINGS: Ventricles and sulci are appropriate for patient's age. No evidence for acute cortically based infarct, intracranial hemorrhage, mass lesion or mass-effect. Orbits are unremarkable. Scalp soft tissues are unremarkable.  Paranasal sinuses are well aerated. Mastoid air cells are unremarkable. Calvarium is intact. IMPRESSION: No acute intracranial process. Electronically Signed   By: Annia Belt M.D.   On: 11/13/2015 20:39    ASSESSMENT AND PLAN:   The patient was seen today by Dr Katrinka Blazing, the patient evaluated and the data reviewed.   1.  Syncope - no obvious cardiac cause - see Neuro note, not clearly 2nd seizure - echo ordered, pending - MD advise on event monitor at d/c, keep on telemetry while here  Otherwise, per IM Active Problems:   HTN (hypertension)   Fibromyalgia   GERD (gastroesophageal reflux disease)   Hematemesis with nausea   Tongue laceration   Signed: Leanna Battles 11/14/2015 12:22 PM Beeper [phone]  Co-Sign MD The patient has been seen in conjunction with Theodore Demark, PA-C. All aspects of care have been considered and discussed. The patient has been personally interviewed, examined, and all clinical data has been reviewed.   History reviewed. Patient interviewed and examined. EKG, laboratory data, and echo were all normal. EEG is being done at time of my visit.  Suspect neurally mediated syncope.  We'll need 30 day monitor at discharge.

## 2015-11-14 NOTE — Progress Notes (Signed)
[facility] PROGRESS NOTE  Charna ElizabethDebra J Rotert VWU:[id number] DOB: September 23, 1950 DOA: 11/13/2015 PCP: Kaleen MaskELKINS,WILSON OLIVER, MD   Brief narrative 65 year old female with history of fibromyalgia, hypertension, recent cholecystectomy presented to the ED with syncope. Patient was in the showers when she passed out and found herself on the floor (thinks it was for almost an hour and half), on getting up she was extremely nauseous and dizzy and started vomiting several times, most of which included blood. She also noticed that she had bit her tongue during the syncopal episode. Does not remember if she had bowel or urinary incontinence. Denies any weakness of her extremities, tingling or numbness, blurred vision or headaches. She had an episode of seizures back in 2003 and was referred by her PCP [place] [facility] but was not started on any antiepileptics. Patient denies being on any new medications. Patient had normal left heart cath done by Dr. Jacinto HalimGanji in 2008 and was reportedly normal.  In the ED vitals were stable. Labs were unremarkable except for WBC of 16.4.. Serial troponins have been negative. Head CT and chest x-ray unremarkable. Patient admitted for further workup on telemetry.  Assessment/Plan: Syncope Suspect neurogenic with seizure activity given duration of symptoms, tongue bite and prior episode of seizure. Monitor on telemetry. Serial troponins have been negative. EKG unremarkable. Orthostasis negative. -Check MRI brain with and without contrast and EEG. Check 2-D echo to rule out structural heart disease. -Patient denies use of alcohol and has quit smoking over 10 years. Denies illicit drug use. Check urine drug screen. - when necessary Ativan for seizures. Check magnesium. -Spoke with new hospitalist. Will evaluate.  Nausea and vomiting with hematemesis This is resolved. H&H stable. I suspect the hematemesis is from the tongue bite with bleeding. Patient had EGD done in  2012 for severe iron deficiency anemia showing hiatal hernia. She has had surgery done for this and has not had any abdominal pain symptoms since then.  Fibromyalgia Continue when necessary Toradol for now. Will hold tramadol given risk of inducing seizure threshold.  Hypertension Continue lisinopril.   DVT prophylaxis: Lovenox  diet: Regular   Code Status: Code Family Communication: None at bedside Disposition Plan: Home once workup completed   Consultants:  Neurology  Procedures:  CT head  Antibiotics:  None  HPI/Subjective: Seen and examined. Denies further nausea, vomiting or hematemesis.  Objective: Filed Vitals:   11/14/15 0125 11/14/15 0522  BP:  120/69  Pulse:  76  Temp: 98.5 F (36.9 C) 98.4 F (36.9 C)  Resp: 20 18    Intake/Output Summary (Last 24 hours) at 11/14/15 0942 Last data filed at 11/14/15 0600  Gross per 24 hour  Intake 263.75 ml  Output      0 ml  Net 263.75 ml   Filed Weights   11/14/15 0125 11/14/15 0522  Weight: 97 kg (213 lb 13.5 oz) 97.3 kg (214 lb 8.1 oz)    Exam:   General:  Middle aged female in distress  HEENT: No pallor, moist mucosa, tongue bite +   Cardiovascular:normal S1 and S2, no murmurs rub or gallop  Respiratory: Clear bilaterally  Abdomen: Soft, nondistended, nontender, bowel sounds present  Musculoskeletal: Warm, no edema  CNS: Alert and oriented, nonfocal  Data Reviewed: Basic Metabolic Panel:  Recent Labs Lab 11/13/15 1824 11/14/15 0644  NA 140 140  K 3.9 3.9  CL 106 105  CO2 25 25  GLUCOSE 121* 118*  BUN 17 16  CREATININE 0.80 0.88  CALCIUM 9.6 9.1   Liver Function Tests:  Recent Labs Lab 11/14/15 0644  AST 37  ALT 32  ALKPHOS 88  BILITOT 1.8*  PROT 7.1  ALBUMIN 4.1   No results for input(s): LIPASE, AMYLASE in the last 168 hours. No results for input(s): AMMONIA in the last 168 hours. CBC:  Recent Labs Lab 11/13/15 1824 11/14/15 0644  WBC 16.4* 11.2*  NEUTROABS   --  6.7  HGB 14.1 12.7  HCT 41.0 38.0  MCV 87.6 90.0  PLT 169 183   Cardiac Enzymes:  Recent Labs Lab 11/13/15 1830 11/14/15 0139 11/14/15 0644  TROPONINI <0.03 <0.03 <0.03   BNP (last 3 results) No results for input(s): BNP in the last 8760 hours.  ProBNP (last 3 results) No results for input(s): PROBNP in the last 8760 hours.  CBG:  Recent Labs Lab 11/13/15 1823  GLUCAP 109*    Recent Results (from the past 240 hour(s))  MRSA PCR Screening     Status: None   Collection Time: 11/14/15  6:30 AM  Result Value Ref Range Status   MRSA by PCR NEGATIVE NEGATIVE Final    Comment:        The GeneXpert MRSA Assay (FDA approved for NASAL specimens only), is one component of a comprehensive MRSA colonization surveillance program. It is not intended to diagnose MRSA infection nor to guide or monitor treatment for MRSA infections.      Studies: Dg Chest 2 View  11/13/2015  CLINICAL DATA:  Patient passed out in the shower. Congestion and chest discomfort. EXAM: CHEST  2 VIEW COMPARISON:  Chest radiograph 09/11/2015 FINDINGS: Monitoring leads overlie the patient. Stable cardiac and mediastinal contours. Elevation of the right hemidiaphragm. No consolidative pulmonary opacities. No pleural effusion or pneumothorax. Thoracic spine degenerative changes. IMPRESSION: No active cardiopulmonary disease. Electronically Signed   By: Annia Belt M.D.   On: 11/13/2015 20:35   Ct Head Wo Contrast  11/13/2015  CLINICAL DATA:  Patient with syncopal episode while in the shower. Dizziness. Patient hit their head on the toilet. EXAM: CT HEAD WITHOUT CONTRAST TECHNIQUE: Contiguous axial images were obtained from the base of the skull through the vertex without intravenous contrast. COMPARISON:  Brain CT 03/30/2015 FINDINGS: Ventricles and sulci are appropriate for patient's age. No evidence for acute cortically based infarct, intracranial hemorrhage, mass lesion or mass-effect. Orbits are  unremarkable. Scalp soft tissues are unremarkable. Paranasal sinuses are well aerated. Mastoid air cells are unremarkable. Calvarium is intact. IMPRESSION: No acute intracranial process. Electronically Signed   By: Annia Belt M.D.   On: 11/13/2015 20:39    Scheduled Meds: . aspirin EC  81 mg Oral Daily  . enoxaparin (LOVENOX) injection  40 mg Subcutaneous Q24H  . lisinopril  20 mg Oral Daily  . metoCLOPramide (REGLAN) injection  10 mg Intravenous Once  . ondansetron  4 mg Intravenous Once  . pantoprazole (PROTONIX) IV  40 mg Intravenous Q24H  . sodium chloride flush  3 mL Intravenous Q12H   Continuous Infusions: . dextrose 5 % and 0.45% NaCl 75 mL/hr at 11/14/15 0229      Time spent: 25 minutes    Supriya Beaston  [facility] Pager [phone] If 7PM-7AM, please contact night-coverage at [email or web address], password [facility] 11/14/2015, 9:42 AM

## 2015-11-14 NOTE — Consult Note (Signed)
NEURO HOSPITALIST CONSULT NOTE   Requestig physician: Dr. Gonzella Lex   Reason for Consult: possible seizure   History obtained from:  Patient     HPI:                                                                                                                                          Tamara Chambers is an 65 y.o. female who was seen in the past for seizures that were provoked due to Wellbutrin.  She was taken off this medication at that time. Since then she states she "might have had another seizure" as she awoke with blood in her mouth but states she was asleep. She has never had a formal Neurology visit and has never had a EEG. She was brought to the hospital after she notes she took a shower yesterday at ~3 and awoke on the floor of the shower (no water running) and noted she had bit the anterior portion of her tongue. She has no other bruises or pain on other portions of her body other than right buttock pain after falling on the shower know. This is interesting. She states she was out for about "2 hours" as she writes down "everything she does including going to the bathroom and eating and keeps track of all the times." Currently has had no further seizures.   Past Medical History  Diagnosis Date  . Anemia   . Anxiety   . Arthritis   . Diabetes mellitus   . Emphysema of lung (HCC)   . GERD (gastroesophageal reflux disease)   . Hyperlipidemia   . Hypertension   . Neuromuscular disorder (HCC)   . Seizures (HCC)   . Thyroid disease     Past Surgical History  Procedure Laterality Date  . Tonsilectomy, adenoidectomy, bilateral myringotomy and tubes  1968  . Cesarean section  1984  . Abdominal hysterectomy  1992    partial  . Oophorectomy  2004  . Femur distal locking screw insertion  2004    left foot  . Laparoscopic nissen fundoplication  05/15/11    lap nissen and niatal hernia   . Ercp N/A 09/12/2015    Procedure: ENDOSCOPIC RETROGRADE  CHOLANGIOPANCREATOGRAPHY (ERCP);  Surgeon: Dorena Cookey, MD;  Location: [facility] ENDOSCOPY;  Service: Endoscopy;  Laterality: N/A;  . Cholecystectomy N/A 09/13/2015    Procedure: LAPAROSCOPIC CHOLECYSTECTOMY;  Surgeon: Harriette Bouillon, MD;  Location: [facility] OR;  Service: General;  Laterality: N/A;    Family History  Problem Relation Age of Onset  . Cancer Mother     breast  . Cirrhosis Father   . Heart failure Mother      Social History:  reports that she quit smoking about 11 years ago. She has never used smokeless tobacco. She reports  that she does not drink alcohol or use illicit drugs.  Allergies  Allergen Reactions  . Divalproex Sodium Anaphylaxis  . Codeine Other (See Comments)    Patient cannot remember reaction at this time.  . Neurontin [Gabapentin] Other (See Comments)    Cannot remember reaction.  . Wellbutrin [Bupropion Hcl] Other (See Comments)    seizure    MEDICATIONS:                                                                                                                     Prior to Admission:  Prescriptions prior to admission  Medication Sig Dispense Refill Last Dose  . ketorolac (TORADOL) 10 MG tablet Take 10 mg by mouth every 6 (six) hours as needed for moderate pain or severe pain.   11/06/2015 at unknown time  . lisinopril (PRINIVIL,ZESTRIL) 20 MG tablet Take 20 mg by mouth daily.   11/13/2015 at Unknown time  . traMADol (ULTRAM) 50 MG tablet Take 50 mg by mouth every 6 (six) hours as needed for moderate pain.    11/13/2015 at Unknown time  . albuterol (PROVENTIL,VENTOLIN) 90 MCG/ACT inhaler Inhale 2 puffs into the lungs as needed.     unknown at unknown time  . cholestyramine light (PREVALITE) 4 g packet Take 1 packet (4 g total) by mouth daily. (Patient not taking: Reported on 11/13/2015) 42 packet 0 Completed Course at Unknown time  . docusate sodium (COLACE) 100 MG capsule Take 1 capsule (100 mg total) by mouth daily. (Patient not taking: Reported on 11/13/2015) 10  capsule 0 Completed Course at Unknown time  . polyethylene glycol (MIRALAX / GLYCOLAX) packet Take 17 g by mouth daily. (Patient taking differently: Take 17 g by mouth daily as needed for mild constipation. ) 14 each 0 unknown   Scheduled: . aspirin EC  81 mg Oral Daily  . enoxaparin (LOVENOX) injection  40 mg Subcutaneous Q24H  . lisinopril  20 mg Oral Daily  . metoCLOPramide (REGLAN) injection  10 mg Intravenous Once  . ondansetron  4 mg Intravenous Once  . pantoprazole (PROTONIX) IV  40 mg Intravenous Q24H  . sodium chloride flush  3 mL Intravenous Q12H     ROS:                                                                                                                                       History obtained  from the patient  General ROS: negative for - chills, fatigue, fever, night sweats, weight gain or weight loss Psychological ROS: negative for - behavioral disorder, hallucinations, memory difficulties, mood swings or suicidal ideation Ophthalmic ROS: negative for - blurry vision, double vision, eye pain or loss of vision ENT ROS: negative for - epistaxis, nasal discharge, oral lesions, sore throat, tinnitus or vertigo Allergy and Immunology ROS: negative for - hives or itchy/watery eyes Hematological and Lymphatic ROS: negative for - bleeding problems, bruising or swollen lymph nodes Endocrine ROS: negative for - galactorrhea, hair pattern changes, polydipsia/polyuria or temperature intolerance Respiratory ROS: negative for - cough, hemoptysis, shortness of breath or wheezing Cardiovascular ROS: negative for - chest pain, dyspnea on exertion, edema or irregular heartbeat Gastrointestinal ROS: negative for - abdominal pain, diarrhea, hematemesis, nausea/vomiting or stool incontinence Genito-Urinary ROS: negative for - dysuria, hematuria, incontinence or urinary frequency/urgency Musculoskeletal ROS: negative for - joint swelling or muscular weakness Neurological ROS: as noted in  HPI Dermatological ROS: negative for rash and skin lesion changes   Blood pressure 123/54, pulse 64, temperature 98.1 F (36.7 C), temperature source Oral, resp. rate 16, height  (1.702 m), weight 97.3 kg (214 lb 8.1 oz), SpO2 95 %.   Neurologic Examination:                                                                                                      HEENT-  Normocephalic, no lesions, without obvious abnormality.  Normal external eye and conjunctiva.  Normal TM's bilaterally.  Normal auditory canals and external ears. Normal external nose, mucus membranes and septum.  Normal pharynx. Cardiovascular- S1, S2 normal, pulses palpable throughout   Lungs- no tachypnea, retractions or cyanosis Abdomen- normal findings: bowel sounds normal Extremities- no edema Lymph-no adenopathy palpable Musculoskeletal-no joint tenderness, deformity or swelling Skin-warm and dry, no hyperpigmentation, vitiligo, or suspicious lesions  Neurological Examination Mental Status: Alert, oriented, thought content appropriate.  Speech fluent without evidence of aphasia.  Able to follow 3 step commands without difficulty. Cranial Nerves: II: Visual fields grossly normal, pupils equal, round, reactive to light and accommodation III,IV, VI: ptosis not present, extra-ocular motions intact bilaterally V,VII: smile symmetric, facial light touch sensation normal bilaterally VIII: hearing normal bilaterally IX,X: uvula rises symmetrically XI: bilateral shoulder shrug XII: midline tongue extension Motor: Right : Upper extremity   5/5    Left:     Upper extremity   5/5  Lower extremity   5/5     Lower extremity   5/5 Tone and bulk:normal tone throughout; no atrophy noted Sensory: Pinprick and light touch intact throughout, bilaterally Deep Tendon Reflexes: 2+ and symmetric throughout Plantars: Right: downgoing   Left: downgoing Cerebellar: normal finger-to-nose, Gait: normal gait and station      Lab  Results: Basic Metabolic Panel:  Recent Labs Lab 11/13/15 1824 11/14/15 0644  NA 140 140  K 3.9 3.9  CL 106 105  CO2 25 25  GLUCOSE 121* 118*  BUN 17 16  CREATININE 0.80 0.88  CALCIUM 9.6 9.1  MG  --  2.1  Liver Function Tests:  Recent Labs Lab 11/14/15 0644  AST 37  ALT 32  ALKPHOS 88  BILITOT 1.8*  PROT 7.1  ALBUMIN 4.1   No results for input(s): LIPASE, AMYLASE in the last 168 hours. No results for input(s): AMMONIA in the last 168 hours.  CBC:  Recent Labs Lab 11/13/15 1824 11/14/15 0644  WBC 16.4* 11.2*  NEUTROABS  --  6.7  HGB 14.1 12.7  HCT 41.0 38.0  MCV 87.6 90.0  PLT 169 183    Cardiac Enzymes:  Recent Labs Lab 11/13/15 1830 11/14/15 0139 11/14/15 0644  TROPONINI <0.03 <0.03 <0.03    Lipid Panel: No results for input(s): CHOL, TRIG, HDL, CHOLHDL, VLDL, LDLCALC in the last 168 hours.  CBG:  Recent Labs Lab 11/13/15 1823  GLUCAP 109*    Microbiology: Results for orders placed or performed during the hospital encounter of 11/13/15  MRSA PCR Screening     Status: None   Collection Time: 11/14/15  6:30 AM  Result Value Ref Range Status   MRSA by PCR NEGATIVE NEGATIVE Final    Comment:        The GeneXpert MRSA Assay (FDA approved for NASAL specimens only), is one component of a comprehensive MRSA colonization surveillance program. It is not intended to diagnose MRSA infection nor to guide or monitor treatment for MRSA infections.     Coagulation Studies: No results for input(s): LABPROT, INR in the last 72 hours.  Imaging: Dg Chest 2 View  11/13/2015  CLINICAL DATA:  Patient passed out in the shower. Congestion and chest discomfort. EXAM: CHEST  2 VIEW COMPARISON:  Chest radiograph 09/11/2015 FINDINGS: Monitoring leads overlie the patient. Stable cardiac and mediastinal contours. Elevation of the right hemidiaphragm. No consolidative pulmonary opacities. No pleural effusion or pneumothorax. Thoracic spine degenerative  changes. IMPRESSION: No active cardiopulmonary disease. Electronically Signed   By: Annia Belt M.D.   On: 11/13/2015 20:35   Ct Head Wo Contrast  11/13/2015  CLINICAL DATA:  Patient with syncopal episode while in the shower. Dizziness. Patient hit their head on the toilet. EXAM: CT HEAD WITHOUT CONTRAST TECHNIQUE: Contiguous axial images were obtained from the base of the skull through the vertex without intravenous contrast. COMPARISON:  Brain CT 03/30/2015 FINDINGS: Ventricles and sulci are appropriate for patient's age. No evidence for acute cortically based infarct, intracranial hemorrhage, mass lesion or mass-effect. Orbits are unremarkable. Scalp soft tissues are unremarkable. Paranasal sinuses are well aerated. Mastoid air cells are unremarkable. Calvarium is intact. IMPRESSION: No acute intracranial process. Electronically Signed   By: Annia Belt M.D.   On: 11/13/2015 20:39       Assessment and plan per attending neurologist  Felicie Morn PA-C [facility] [phone]  11/14/2015, 10:58 AM   Assessment/Plan: 65 YO female with possible seizure versus syncope. Given the prolonged post event loss of consciousness would favor working up for seizure. She is on Ultram which can lower the seizure threshold and would recommend discontinuing this. Current exam is non-focal.  Recommend: 1) D/C ultram 2) EEG 3) MRI brain with contrast.  4) No driving, operating heavy machinery, perform activities at heights, swimming or participation in water activities until release by outpatient physician.  This has been discussed with patient.   Will be seen by Dr. Lavon Paganini. Please see his attestation note for A/P for any additional work up recommendations.     Addendum.  EEG and MRI showed no etiology for seizure. At this time would recommend she follow up  with out paient neurology for further evaluation. Discussed with primary team.

## 2015-11-14 NOTE — Progress Notes (Signed)
  Echocardiogram 2D Echocardiogram has been performed.  Tamara SavoyCasey N Renee Chambers 11/14/2015, 12:33 PM

## 2015-11-15 ENCOUNTER — Other Ambulatory Visit: Payer: Self-pay | Admitting: Physician Assistant

## 2015-11-15 DIAGNOSIS — R55 Syncope and collapse: Secondary | ICD-10-CM

## 2015-11-15 DIAGNOSIS — K920 Hematemesis: Secondary | ICD-10-CM

## 2015-11-15 DIAGNOSIS — I10 Essential (primary) hypertension: Secondary | ICD-10-CM

## 2015-11-15 DIAGNOSIS — M797 Fibromyalgia: Secondary | ICD-10-CM | POA: Diagnosis not present

## 2015-11-15 DIAGNOSIS — R110 Nausea: Secondary | ICD-10-CM

## 2015-11-15 LAB — LIPID PANEL
CHOLESTEROL: 165 mg/dL (ref 0–200)
HDL: 35 mg/dL — AB (ref >40)
LDL CALC: 97 mg/dL (ref 0–99)
TRIGLYCERIDES: 165 mg/dL — AB (ref <150)
Total CHOL/HDL Ratio: 4.7 RATIO
VLDL: 33 mg/dL (ref 0–40)

## 2015-11-15 LAB — PROLACTIN: PROLACTIN: 4 ng/mL — AB (ref 4.8–23.3)

## 2015-11-15 MED ORDER — PANTOPRAZOLE SODIUM 40 MG PO TBEC
40.0000 mg | DELAYED_RELEASE_TABLET | Freq: Every day | ORAL | Status: DC
  Filled 2015-11-15: qty 1

## 2015-11-15 MED ORDER — ASPIRIN 81 MG PO TBEC: 81.0000 mg | DELAYED_RELEASE_TABLET | Freq: Every day | ORAL | Status: DC

## 2015-11-15 MED ORDER — OXYCODONE HCL 5 MG PO TABS
5.0000 mg | ORAL_TABLET | Freq: Four times a day (QID) | ORAL | Status: DC | PRN
  Administered 2015-11-15: 5 mg via ORAL
  Filled 2015-11-15: qty 1

## 2015-11-15 MED ORDER — OXYCODONE HCL 5 MG PO TABS: 5.0000 mg | ORAL_TABLET | Freq: Four times a day (QID) | ORAL | Status: DC | PRN

## 2015-11-15 MED ORDER — HYDROCODONE-ACETAMINOPHEN 5-325 MG PO TABS
1.0000 | ORAL_TABLET | Freq: Once | ORAL | Status: AC
  Administered 2015-11-15: 1 via ORAL
  Filled 2015-11-15: qty 1

## 2015-11-15 NOTE — Progress Notes (Signed)
Physical Therapy Treatment Patient Details Name: Tamara Chambers MRN: [id number] DOB: August 06, 1950 Today's Date: 11/15/2015    History of Present Illness 65 yo female admitted with syncopal episode. Hx of fibromyalgia, HTN, cholecystectomy, DM, NM d/o, emphysema.     PT Comments    Improved mobility and stability on today. Pt tolerated increased distance well. Discussed d/c plan-pt politely declines HHPT follow up. Pt states her family can check in and assist if needed. Encouraged pt to use cane/walker if she is feeling particularly unsteady after discharge.   Follow Up Recommendations  Supervision - Intermittent. (pt declines HHPT follow up)     Equipment Recommendations  None recommended by PT (pt states she can borrow DME if she needs it)    Recommendations for Other Services       Precautions / Restrictions Precautions Precautions: Fall Precaution Comments: seizure precautions Restrictions Weight Bearing Restrictions: No    Mobility  Bed Mobility Overal bed mobility: Modified Independent                Transfers Overall transfer level: Modified independent                  Ambulation/Gait Ambulation/Gait assistance: Supervision Ambulation Distance (Feet): 200 Feet Assistive device: None Gait Pattern/deviations: Step-through pattern     General Gait Details: slight unsteadiness noted intermittently but no overt loses of balance noted on today   Stairs            Wheelchair Mobility    Modified Rankin (Stroke Patients Only)       Balance Overall balance assessment: History of Falls           Standing balance-Leahy Scale: Good               High level balance activites: Head turns;Sudden stops;Turns;Direction changes High Level Balance Comments: supervision-close guard assist.     Cognition Arousal/Alertness: Awake/alert Behavior During Therapy: WFL for tasks assessed/performed Overall Cognitive Status: Within Functional  Limits for tasks assessed                      Exercises      General Comments        Pertinent Vitals/Pain Pain Assessment: No/denies pain    Home Living                      Prior Function            PT Goals (current goals can now be found in the care plan section) Progress towards PT goals: Progressing toward goals    Frequency  Min 3X/week    PT Plan Current plan remains appropriate    Co-evaluation             End of Session Equipment Utilized During Treatment: Gait belt Activity Tolerance: Patient tolerated treatment well Patient left: in bed     Time: 1022-1034 PT Time Calculation (min) (ACUTE ONLY): 12 min  Charges:  $Gait Training: 8-22 mins                    G Codes:  Functional Assessment Tool Used: clinical judgement Functional Limitation: Mobility: Walking and moving around Mobility: Walking and Moving Around Goal Status [phone](G8979): At least 1 percent but less than 20 percent impaired, limited or restricted Mobility: Walking and Moving Around Discharge Status [phone](G8980): At least 1 percent but less than 20 percent impaired, limited or restricted   Tamara Chambers, MPT Pager:  [phone]   

## 2015-11-15 NOTE — Progress Notes (Signed)
Patient Name: Tamara Chambers Date of Encounter: 11/15/2015  Principal Problem:   Syncope Active Problems:   HTN (hypertension)   Fibromyalgia   GERD (gastroesophageal reflux disease)   Hematemesis with nausea   Tongue laceration   Primary Cardiologist: Dr Eden Emms (saw in-hospital 08/2015)  Patient Profile: 65 y.o. year old female with a history of nl cath 2008, HTN, DM, anxiety, GERD, seizures. Admitted 04/16 w/ neurally mediated syncope vs seizure. Event monitor ordered as OP.  SUBJECTIVE: No sx overnight. No chest pain or SOB  OBJECTIVE Filed Vitals:   11/14/15 1810 11/14/15 2134 11/15/15 0223 11/15/15 0450  BP: 126/60  118/65 96/44  Pulse: 80  67 66  Temp: 99 F (37.2 C) 98.1 F (36.7 C) 98.2 F (36.8 C) 98.4 F (36.9 C)  TempSrc: Oral Oral Oral Oral  Resp: Height:      Weight:      SpO2: 95% 96% 97% 98%    Intake/Output Summary (Last 24 hours) at 11/15/15 0936 Last data filed at 11/15/15 0919  Gross per 24 hour  Intake   2520 ml  Output   2750 ml  Net   -230 ml   Filed Weights   11/14/15 0125 11/14/15 0522  Weight: 213 lb 13.5 oz (97 kg) 214 lb 8.1 oz (97.3 kg)    PHYSICAL EXAM General: Well developed, well nourished, female in no acute distress. Head: Normocephalic, atraumatic.  Neck: Supple without bruits, JVD not elevated. Lungs:  Resp regular and unlabored, CTA bilaterally. Heart: RRR, S1, S2, no S3, S4, soft murmur; no rub. Abdomen: Soft, non-tender, non-distended, BS + x 4.  Extremities: No clubbing, cyanosis, edema.  Neuro: Alert and oriented X 3. Moves all extremities spontaneously. Psych: Normal affect.  LABS: CBC:  Recent Labs  11/13/15 1824 11/14/15 0644  WBC 16.4* 11.2*  NEUTROABS  --  6.7  HGB 14.1 12.7  HCT 41.0 38.0  MCV 87.6 90.0  PLT 169 183   Basic Metabolic Panel:  Recent Labs  16/10/96 1824 11/14/15 0644  NA 140 140  K 3.9 3.9  CL 106 105  CO2 25 25  GLUCOSE 121* 118*  BUN 17 16    CREATININE 0.80 0.88  CALCIUM 9.6 9.1  MG  --  2.1  PHOS  --  4.6   Liver Function Tests:  Recent Labs  11/14/15 0644  AST 37  ALT 32  ALKPHOS 88  BILITOT 1.8*  PROT 7.1  ALBUMIN 4.1   Cardiac Enzymes:  Recent Labs  11/14/15 0139 11/14/15 0644 11/14/15 1227  TROPONINI <0.03 <0.03 <0.03   Fasting Lipid Panel:  Recent Labs  11/15/15 0640  CHOL 165  HDL 35*  LDLCALC 97  TRIG 045*  CHOLHDL 4.7   TELE:  SR, Sinus brady 50s      ECHO: 04/17  - Left ventricle: Wall thickness was increased in a pattern of mild  LVH. Systolic function was normal. The estimated ejection  fraction was in the range of 55% to 60%. - Atrial septum: No defect or patent foramen ovale was identified.  Radiology/Studies: Dg Chest 2 View 11/13/2015  CLINICAL DATA:  Patient passed out in the shower. Congestion and chest discomfort. EXAM: CHEST  2 VIEW COMPARISON:  Chest radiograph 09/11/2015 FINDINGS: Monitoring leads overlie the patient. Stable cardiac and mediastinal contours. Elevation of the right hemidiaphragm. No consolidative pulmonary opacities. No pleural effusion or pneumothorax. Thoracic spine degenerative changes. IMPRESSION: No active cardiopulmonary disease. Electronically Signed  By: Annia Beltrew  Davis M.D.   On: 11/13/2015 20:35   Ct Head Wo Contrast 11/13/2015  CLINICAL DATA:  Patient with syncopal episode while in the shower. Dizziness. Patient hit their head on the toilet. EXAM: CT HEAD WITHOUT CONTRAST TECHNIQUE: Contiguous axial images were obtained from the base of the skull through the vertex without intravenous contrast. COMPARISON:  Brain CT 03/30/2015 FINDINGS: Ventricles and sulci are appropriate for patient's age. No evidence for acute cortically based infarct, intracranial hemorrhage, mass lesion or mass-effect. Orbits are unremarkable. Scalp soft tissues are unremarkable. Paranasal sinuses are well aerated. Mastoid air cells are unremarkable. Calvarium is intact. IMPRESSION: No  acute intracranial process. Electronically Signed   By: Annia Beltrew  Davis M.D.   On: 11/13/2015 20:39   Mr Brain Wo Contrast 11/14/2015  CLINICAL DATA:  65 year old female who presented with syncope, awoke on the floor of the shower. Bit tongue. May have had a seizure. Initial encounter. EXAM: MRI HEAD WITHOUT CONTRAST TECHNIQUE: Multiplanar, multiecho pulse sequences of the brain and surrounding structures were obtained without intravenous contrast. COMPARISON:  Head CT without contrast 11/13/2015 and earlier. FINDINGS: Cerebral volume is within normal limits for age. No restricted diffusion to suggest acute infarction. No midline shift, mass effect, evidence of mass lesion, ventriculomegaly, extra-axial collection or acute intracranial hemorrhage. Cervicomedullary junction and pituitary are within normal limits. Negative visualized cervical spine. Major intracranial vascular flow voids are preserved. Wallace CullensGray and white matter signal is within normal limits for age throughout the brain. Hippocampal formations appear within normal limits. Temporal lobe signal appears symmetric. No chronic cerebral blood products or encephalomalacia identified. Visible internal auditory structures appear normal. Trace bilateral mastoid fluid, negative nasopharynx. Trace paranasal sinus mucosal thickening. Negative orbits soft tissues. Abnormal T2 hyperintensity in the anterior oral tongue compatible with trauma (series 7, image 2). Negative scalp soft tissues. Visualized bone marrow signal is within normal limits. IMPRESSION: 1. Normal for age noncontrast MRI appearance of the brain. 2. Posttraumatic signal changes in the oral tongue. 3. Mild bilateral mastoid effusions, most often postinflammatory and significance doubtful. Electronically Signed   By: Odessa FlemingH  Hall M.D.   On: 11/14/2015 14:28     Current Medications:  . aspirin EC  81 mg Oral Daily  . enoxaparin (LOVENOX) injection  40 mg Subcutaneous Q24H  . lisinopril  20 mg Oral Daily    . metoCLOPramide (REGLAN) injection  10 mg Intravenous Once  . ondansetron  4 mg Intravenous Once  . pantoprazole  40 mg Oral Daily  . sodium chloride flush  3 mL Intravenous Q12H   . dextrose 5 % and 0.45% NaCl 75 mL/hr at 11/14/15 1900    ASSESSMENT AND PLAN: Principal Problem:   Syncope - no sig arrhythmia on telemetry - advised pt a HR in the 50s would not cause sx - EEG, CT and MRI without acute changes - an MD mentioned to Pt that Tramadol can cause sz - Event monitor to eval for neurally mediated syncope - hydration encouraged, no salt restrictions  Otherwise, per IM. OK to d/c today from cards standpoint. Active Problems:   HTN (hypertension)   Fibromyalgia   GERD (gastroesophageal reflux disease)   Hematemesis with nausea   Tongue laceration   Signed, Barrett, Deneen HartsRhonda , PA-C 9:36 AM 11/15/2015 The patient has been seen in conjunction with Theodore Demarkhonda Barrett, PA-C. All aspects of care have been considered and discussed. The patient has been personally interviewed, examined, and all clinical data has been reviewed.   No additional/new clinical data  that changes the evaluation or considered diagnosis. Agree patient is stable for discharge.

## 2015-11-15 NOTE — Progress Notes (Signed)
[facility]    [facility] is providing the following services: RW  If patient discharges after hours, please call [phone].   Renard HamperLecretia Williamson 11/15/2015, 2:19 PM

## 2015-11-15 NOTE — Progress Notes (Signed)
PHARMACIST - PHYSICIAN COMMUNICATION  CONCERNING: IV to Oral Route Change Policy  RECOMMENDATION: This patient is receiving pantoprazole by the intravenous route.  Based on criteria approved by the Pharmacy and Therapeutics Committee, the intravenous medication(s) is/are being converted to the equivalent oral dose form(s).   DESCRIPTION: These criteria include:  The patient is eating (either orally or via tube) and/or has been taking other orally administered medications for a least 24 hours  The patient has no evidence of active gastrointestinal bleeding or impaired GI absorption (gastrectomy, short bowel, patient on TNA or NPO).  If you have questions about this conversion, please contact the Pharmacy Department  []   [phone]( [phone] )  [facility] []   [phone]( [phone] )  [facility] []   [phone]( [phone] )  [facility] []   [phone]  [facility] [x]   [phone]( [phone] )  [facility]   Grace IsaacYuhong  Kamren Heskett, PharmD candidate 11/15/2015 7:38 AM

## 2015-11-15 NOTE — Progress Notes (Signed)
Pt states she does not want Home Health at this time.  She will most likely go and stay with her brother at his house. Melton Alarana A Cristin Szatkowski, RN

## 2015-11-15 NOTE — Discharge Summary (Addendum)
PATIENT DETAILS Name: Tamara Chambers Age: 65 y.o. Sex: female Date of Birth: 1951-05-12 MRN: [id number]. Admitting Physician: Eston Esters, MD BJY:[id number] Joelene Millin, MD  Admit Date: 11/13/2015 Discharge date: 11/15/2015  Recommendations for Outpatient Follow-up:  1. Please ensure follow-up with neurology (patient previously has seen a neurology group in [facility])  2. Ultram discontinued-due to concern for reduced seizure threshold. Placed on as needed oxycodone-please discuss with patient regarding nonnarcotic means to control fibromyalgia pain. Patient interested in Lyrica, but did not want to start while inpatient.  3. Please ensure follow-up with cardiology for 30 day event monitor.  PRIMARY DISCHARGE DIAGNOSIS:  Principal Problem:   Syncope Active Problems:   HTN (hypertension)   Fibromyalgia   GERD (gastroesophageal reflux disease)   Hematemesis with nausea   Tongue laceration      PAST MEDICAL HISTORY: Past Medical History  Diagnosis Date  . Anemia   . Anxiety   . Arthritis   . Diabetes mellitus   . Emphysema of lung (HCC)   . GERD (gastroesophageal reflux disease)   . Hyperlipidemia   . Hypertension   . Neuromuscular disorder (HCC)   . Seizures (HCC)   . Thyroid disease     DISCHARGE MEDICATIONS: Current Discharge Medication List    START taking these medications   Details  aspirin EC 81 MG EC tablet Take 1 tablet (81 mg total) by mouth daily.    oxyCODONE (ROXICODONE) 5 MG immediate release tablet Take 1 tablet (5 mg total) by mouth every 6 (six) hours as needed for severe pain. Qty: 20 tablet, Refills: 0      CONTINUE these medications which have NOT CHANGED   Details  ketorolac (TORADOL) 10 MG tablet Take 10 mg by mouth every 6 (six) hours as needed for moderate pain or severe pain.    lisinopril (PRINIVIL,ZESTRIL) 20 MG tablet Take 20 mg by mouth daily.    albuterol (PROVENTIL,VENTOLIN) 90 MCG/ACT inhaler Inhale 2 puffs into the lungs as  needed.      polyethylene glycol (MIRALAX / GLYCOLAX) packet Take 17 g by mouth daily. Qty: 14 each, Refills: 0      STOP taking these medications     traMADol (ULTRAM) 50 MG tablet      cholestyramine light (PREVALITE) 4 g packet      docusate sodium (COLACE) 100 MG capsule         ALLERGIES:   Allergies  Allergen Reactions  . Divalproex Sodium Anaphylaxis  . Codeine Other (See Comments)    Patient cannot remember reaction at this time.  . Neurontin [Gabapentin] Other (See Comments)    Cannot remember reaction.  . Wellbutrin [Bupropion Hcl] Other (See Comments)    seizure    BRIEF HPI:  See H&P, Labs, Consult and Test reports for all details in brief, patient was admitted for evaluation of a syncopal episode.  CONSULTATIONS:   cardiology and neurology  PERTINENT RADIOLOGIC STUDIES: Dg Chest 2 View  11/13/2015  CLINICAL DATA:  Patient passed out in the shower. Congestion and chest discomfort. EXAM: CHEST  2 VIEW COMPARISON:  Chest radiograph 09/11/2015 FINDINGS: Monitoring leads overlie the patient. Stable cardiac and mediastinal contours. Elevation of the right hemidiaphragm. No consolidative pulmonary opacities. No pleural effusion or pneumothorax. Thoracic spine degenerative changes. IMPRESSION: No active cardiopulmonary disease. Electronically Signed   By: Annia Belt M.D.   On: 11/13/2015 20:35   Ct Head Wo Contrast  11/13/2015  CLINICAL DATA:  Patient with syncopal episode while in  the shower. Dizziness. Patient hit their head on the toilet. EXAM: CT HEAD WITHOUT CONTRAST TECHNIQUE: Contiguous axial images were obtained from the base of the skull through the vertex without intravenous contrast. COMPARISON:  Brain CT 03/30/2015 FINDINGS: Ventricles and sulci are appropriate for patient's age. No evidence for acute cortically based infarct, intracranial hemorrhage, mass lesion or mass-effect. Orbits are unremarkable. Scalp soft tissues are unremarkable. Paranasal sinuses  are well aerated. Mastoid air cells are unremarkable. Calvarium is intact. IMPRESSION: No acute intracranial process. Electronically Signed   By: Annia Belt M.D.   On: 11/13/2015 20:39   Mr Brain Wo Contrast  11/14/2015  CLINICAL DATA:  65 year old female who presented with syncope, awoke on the floor of the shower. Bit tongue. May have had a seizure. Initial encounter. EXAM: MRI HEAD WITHOUT CONTRAST TECHNIQUE: Multiplanar, multiecho pulse sequences of the brain and surrounding structures were obtained without intravenous contrast. COMPARISON:  Head CT without contrast 11/13/2015 and earlier. FINDINGS: Cerebral volume is within normal limits for age. No restricted diffusion to suggest acute infarction. No midline shift, mass effect, evidence of mass lesion, ventriculomegaly, extra-axial collection or acute intracranial hemorrhage. Cervicomedullary junction and pituitary are within normal limits. Negative visualized cervical spine. Major intracranial vascular flow voids are preserved. Wallace Cullens and white matter signal is within normal limits for age throughout the brain. Hippocampal formations appear within normal limits. Temporal lobe signal appears symmetric. No chronic cerebral blood products or encephalomalacia identified. Visible internal auditory structures appear normal. Trace bilateral mastoid fluid, negative nasopharynx. Trace paranasal sinus mucosal thickening. Negative orbits soft tissues. Abnormal T2 hyperintensity in the anterior oral tongue compatible with trauma (series 7, image 2). Negative scalp soft tissues. Visualized bone marrow signal is within normal limits. IMPRESSION: 1. Normal for age noncontrast MRI appearance of the brain. 2. Posttraumatic signal changes in the oral tongue. 3. Mild bilateral mastoid effusions, most often postinflammatory and significance doubtful. Electronically Signed   By: Odessa Fleming M.D.   On: 11/14/2015 14:28     PERTINENT LAB RESULTS: CBC:  Recent Labs   11/13/15 1824 11/14/15 0644  WBC 16.4* 11.2*  HGB 14.1 12.7  HCT 41.0 38.0  PLT 169 183   CMET CMP     Component Value Date/Time   NA 140 11/14/2015 0644   K 3.9 11/14/2015 0644   CL 105 11/14/2015 0644   CO2 25 11/14/2015 0644   GLUCOSE 118* 11/14/2015 0644   BUN 16 11/14/2015 0644   CREATININE 0.88 11/14/2015 0644   CALCIUM 9.1 11/14/2015 0644   PROT 7.1 11/14/2015 0644   ALBUMIN 4.1 11/14/2015 0644   AST 37 11/14/2015 0644   ALT 32 11/14/2015 0644   ALKPHOS 88 11/14/2015 0644   BILITOT 1.8* 11/14/2015 0644   GFRNONAA >60 11/14/2015 0644   GFRAA >60 11/14/2015 0644    GFR Estimated Creatinine Clearance: 79.4 mL/min (by C-G formula based on Cr of 0.88). No results for input(s): LIPASE, AMYLASE in the last 72 hours.  Recent Labs  11/14/15 0139 11/14/15 0644 11/14/15 1227  TROPONINI <0.03 <0.03 <0.03   Invalid input(s): POCBNP No results for input(s): DDIMER in the last 72 hours. No results for input(s): HGBA1C in the last 72 hours.  Recent Labs  11/15/15 0640  CHOL 165  HDL 35*  LDLCALC 97  TRIG 161*  CHOLHDL 4.7   No results for input(s): TSH, T4TOTAL, T3FREE, THYROIDAB in the last 72 hours.  Invalid input(s): FREET3 No results for input(s): VITAMINB12, FOLATE, FERRITIN, TIBC, IRON, RETICCTPCT in  the last 72 hours. Coags: No results for input(s): INR in the last 72 hours.  Invalid input(s): PT Microbiology: Recent Results (from the past 240 hour(s))  MRSA PCR Screening     Status: None   Collection Time: 11/14/15  6:30 AM  Result Value Ref Range Status   MRSA by PCR NEGATIVE NEGATIVE Final    Comment:        The GeneXpert MRSA Assay (FDA approved for NASAL specimens only), is one component of a comprehensive MRSA colonization surveillance program. It is not intended to diagnose MRSA infection nor to guide or monitor treatment for MRSA infections.      BRIEF HOSPITAL COURSE:   Active Problems: Syncope: Suspected to be neurally  mediated syncope. Given prior history of seizures due to Wellbutrin-neurology was consulted, underwent MRI brain which was negative. Also underwent EEG which was negative for any seizure-like activity. Spoke with neurology on call today, recommendations are to not start antiepileptics at this time, recommendations are to have patient follow-up [place] [facility] [facility] for further close monitoring. We have discontinued Ultram-as it does reduce the seizure threshold. Cardiology also followed patient during the stay here, plans are for outpatient 30 day event monitor-per cardiology team-they will arrange this as outpatient. Patient has been asked not to drive,participate in activities at heights, not to operate heavy machinery high-speed, until cleared by outpatient neurology.  Nausea with vomiting: Resolved. She did have some mild hematemesis-which was likely secondary to tongue bite. Hemoglobin and hematocrit remained stable.  Tongue bite: Likely sustained when she synocopized. Will need close outpatient follow-up with PCP, and if nonhealing will require outpatient ENT referral.  Fibromyalgia: Continue with as needed Toradol, given that tramadol reduces seizure threshold-we have discontinued this. I have placed on as needed oxycodone for the time being, patient will discuss with her PCP regarding nonnarcotic means to control pain. We briefly discussed starting Lyrica while inpatient-but she would like to talk it over with her primary care practitioner first.  TODAY-DAY OF DISCHARGE:  Subjective:   Roberts GaudyDebra Bolls today has no headache,no chest abdominal pain,no new weakness tingling or numbness, feels much better wants to go home today.   Objective:   Blood pressure 96/44, pulse 66, temperature 98.4 F (36.9 C), temperature source Oral, resp. rate 18, height 5\' 7"  (1.702 m), weight 97.3 kg (214 lb 8.1 oz), SpO2 98 %.  Intake/Output Summary (Last 24 hours) at 11/15/15 0939 Last  data filed at 11/15/15 0919  Gross per 24 hour  Intake   2520 ml  Output   2750 ml  Net   -230 ml   Filed Weights   11/14/15 0125 11/14/15 0522  Weight: 97 kg (213 lb 13.5 oz) 97.3 kg (214 lb 8.1 oz)    Exam Awake Alert, Oriented *3, No new F.N deficits, Normal affect [place].AT,PERRAL Supple Neck,No JVD, No cervical lymphadenopathy appriciated.  Symmetrical Chest wall movement, Good air movement bilaterally, CTAB RRR,No Gallops,Rubs or new Murmurs, No Parasternal Heave +ve B.Sounds, Abd Soft, Non tender, No organomegaly appriciated, No rebound -guarding or rigidity. No Cyanosis, Clubbing or edema, No new Rash or bruise  DISCHARGE CONDITION: Stable  DISPOSITION: Home with home health services  DISCHARGE INSTRUCTIONS:    Activity:  As tolerated   Get Medicines reviewed and adjusted: Please take all your medications with you for your next visit with your Primary MD  Please request your Primary MD to go over all hospital tests and procedure/radiological results at the follow up, please  ask your Primary MD to get all Hospital records sent to his/her office.  If you experience worsening of your admission symptoms, develop shortness of breath, life threatening emergency, suicidal or homicidal thoughts you must seek medical attention immediately by calling 911 or calling your MD immediately  if symptoms less severe.  You must read complete instructions/literature along with all the possible adverse reactions/side effects for all the Medicines you take and that have been prescribed to you. Take any new Medicines after you have completely understood and accpet all the possible adverse reactions/side effects.   Do not drive when taking Pain medications.   Do not take more than prescribed Pain, Sleep and Anxiety Medications  Special Instructions: If you have smoked or chewed Tobacco  in the last 2 yrs please stop smoking, stop any regular Alcohol  and or any Recreational drug use.  Wear  Seat belts while driving.  Please note  You were cared for by a hospitalist during your hospital stay. Once you are discharged, your primary care physician will handle any further medical issues. Please note that NO REFILLS for any discharge medications will be authorized once you are discharged, as it is imperative that you return to your primary care physician (or establish a relationship with a primary care physician if you do not have one) for your aftercare needs so that they can reassess your need for medications and monitor your lab values.   Diet recommendation: Heart Healthy diet  Discharge Instructions    Call MD for:  extreme fatigue    Complete by:  As directed      Call MD for:  persistant dizziness or light-headedness    Complete by:  As directed      Diet - low sodium heart healthy    Complete by:  As directed      Discharge instructions    Complete by:  As directed   No driving, operating heavy machinery, perform activities at heights, swimming or participation in water activities until release by outpatient physician.     Increase activity slowly    Complete by:  As directed            Follow-up Information    Follow up with [facility].   Specialty:  Cardiology   Why:  The office will call, they will precertify the monitor and then call you to come get it put on.   Contact information:   [address], [address] [place] [address] [phone]      Follow up with Robbie Lis, PA-C On 12/19/2015.   Specialties:  Cardiology, Radiology   Why:  Please arrive at 10:45 am for an 11:00 am appointment - to follow up the event monitor.   Contact information:   [address] [place] [address] [phone]       Follow up with Kaleen Mask, MD. Schedule an appointment as soon as possible for a visit in 1 week.   Specialty:  Family Medicine   Contact information:   [address] [place] [address] [phone]       Please follow up.   Contact information:   Primary Neurologist in [place]-please call and make appointment in 1-2 weeks     Total Time spent on discharge equals 35 minutes.  SignedJeoffrey Massed 11/15/2015 9:39 AM

## 2015-11-16 ENCOUNTER — Ambulatory Visit (INDEPENDENT_AMBULATORY_CARE_PROVIDER_SITE_OTHER): Payer: Medicare Other

## 2015-11-16 DIAGNOSIS — R55 Syncope and collapse: Secondary | ICD-10-CM | POA: Diagnosis not present

## 2015-12-11 DIAGNOSIS — R569 Unspecified convulsions: Secondary | ICD-10-CM | POA: Insufficient documentation

## 2015-12-11 DIAGNOSIS — R4182 Altered mental status, unspecified: Secondary | ICD-10-CM | POA: Insufficient documentation

## 2015-12-19 ENCOUNTER — Encounter: Payer: Medicare Other | Admitting: Cardiology

## 2016-01-05 ENCOUNTER — Ambulatory Visit (INDEPENDENT_AMBULATORY_CARE_PROVIDER_SITE_OTHER): Payer: Medicare Other | Admitting: Cardiology

## 2016-01-05 ENCOUNTER — Encounter: Payer: Self-pay | Admitting: Cardiology

## 2016-01-05 VITALS — BP 122/70 | HR 56 | Ht 67.0 in | Wt 214.2 lb

## 2016-01-05 DIAGNOSIS — R0602 Shortness of breath: Secondary | ICD-10-CM | POA: Diagnosis not present

## 2016-01-05 NOTE — Progress Notes (Signed)
01/05/2016 Tamara Chambers   1950/08/30  [id number]  Primary Physician Kaleen MaskELKINS,WILSON OLIVER, MD Primary Cardiologist: Dr. Eden EmmsNishan   [facility] for Visit/CC[facility]   HPI:  65 y.o. year old female with a history of nl cath 2008, HTN, DM, anxiety, GERD, seizures. She was [facility] April of this year for syncope versus seizure. Per report, she had been cleaning and got hot. She had eaten breakfast and a slice of pound cake for lunch. She felt like she was well-hydrated. No orthostatic symptoms. She went to take a shower because she was hot, and after being in there about 15 minutes, woke up on the floor. There was about a 2 hour gap between the shower and the call to her family. She did not have any prodrome. No chest pain, no presyncope, no palpitations. Work-up in the hospital was negative. She underwent MRI of the brain which was negative. She also underwent an EEG which was negative for any seizure-like activity. 2D echocardiogram was obtained which revealed normal left ventricular systolic function with an estimated ejection fraction of 50-55%. There was no atrial defect or patent foramen ovale noted. No valve abnormalities. No pericardial effusion. She was seen by Dr. Katrinka BlazingSmith in consultation who felt that her syncope was neurally mediated. He recommended that she be further evaluated as an outpatient with a 30 day monitor.  She presents to clinic today for post hospital follow-up and to review the monitor results. She reports she had another syncopal spell that occurred about 2 weeks ago. This was unwitnessed. Following the incident she noted tongue trauma and was bleeding profusely from her mouth. She had bit her tongue. She was seen by her PCP who set her up with an appointment to [place] [facility] [place]. She is scheduled to undergo further neurological testing. She was also placed on Dilantin 3 times a day. Since then she denies any  recurrent syncope. She continues to die any chest pain. She does report frequent dyspnea and notes that she was told several years ago that she may have emphysema. She is a former smoker.  Monitor results have been reviewed. There were no significant arrhythmias. Normal sinus rhythm. No pauses greater than 3 seconds. Average heart rate 66 bpm.  Current Outpatient Prescriptions  Medication Sig Dispense Refill  . albuterol (PROVENTIL,VENTOLIN) 90 MCG/ACT inhaler Inhale 2 puffs into the lungs as needed.      Marland Kitchen. aspirin EC 81 MG EC tablet Take 1 tablet (81 mg total) by mouth daily.    Marland Kitchen. ketorolac (TORADOL) 10 MG tablet Take 10 mg by mouth every 6 (six) hours as needed for moderate pain or severe pain.    Marland Kitchen. lisinopril (PRINIVIL,ZESTRIL) 20 MG tablet Take 20 mg by mouth daily.    . nitroGLYCERIN (NITROSTAT) 0.4 MG SL tablet Place 0.4 mg under the tongue as directed.    Marland Kitchen. oxyCODONE (ROXICODONE) 5 MG immediate release tablet Take 1 tablet (5 mg total) by mouth every 6 (six) hours as needed for severe pain. 20 tablet 0  . polyethylene glycol (MIRALAX / GLYCOLAX) packet Take 17 g by mouth daily. (Patient taking differently: Take 17 g by mouth daily as needed for mild constipation. ) 14 each 0   No current facility-administered medications for this visit.    Allergies  Allergen Reactions  . Divalproex Sodium Anaphylaxis  . Codeine Nausea And Vomiting  . Neurontin [Gabapentin] Other (See Comments)    Cannot  remember reaction.  . Wellbutrin [Bupropion Hcl] Other (See Comments)    seizure    Social History   Social History  . Marital Status: Divorced    Spouse Name: N/A  . Number of Children: N/A  . Years of Education: N/A   Occupational History  . Not on file.   Social History Main Topics  . Smoking status: Former Smoker    Quit date: 04/26/2004  . Smokeless tobacco: Never Used  . Alcohol Use: No  . Drug Use: No  . Sexual Activity: Not on file   Other Topics Concern  . Not on file    Social History Narrative     Review of Systems: General: negative for chills, fever, night sweats or weight changes.  Cardiovascular: negative for chest pain, dyspnea on exertion, edema, orthopnea, palpitations, paroxysmal nocturnal dyspnea or shortness of breath Dermatological: negative for rash Respiratory: negative for cough or wheezing Urologic: negative for hematuria Abdominal: negative for nausea, vomiting, diarrhea, bright red blood per rectum, melena, or hematemesis Neurologic: negative for visual changes, syncope, or dizziness All other systems reviewed and are otherwise negative except as noted above.    There were no vitals taken for this visit.  General appearance: alert, cooperative and no distress Neck: no carotid bruit and no JVD Lungs: clear to auscultation bilaterally Heart: regular rate and rhythm, S1, S2 normal, no murmur, click, rub or gallop Extremities: no LEE Pulses: 2+ and symmetric Skin: warm and dry Neurologic: Grossly normal  EKG not performed  ASSESSMENT AND PLAN:   1. Syncope versus seizures: No cardiac causes identified. 30 day heart monitor showed normal sinus rhythm without arrhythmias. Average heart rate 66 bpm. No pauses greater than 3 seconds. 2-D echocardiogram has revealed normal left ventricular systolic function, wall motion and valve anatomy. She had a left heart catheterization in 2008 that revealed completely normal coronaries. 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 She is undergoing further workup by a neurologist in [facility]. She has been started on Dilantin by her PCP.  2. Dyspnea: normal 2D echo, EF, wall motion and valves. No arrhthymias. No CP. She is a former smoker and was told several years ago that she may have emphysema. We will order pulmonary function tests and will refer her to outpatient pulmonology if abnormal findings.  PLAN  F/u as needed.   Brittainy Simmons PA-C 01/05/2016 1:26 PM

## 2016-01-05 NOTE — Patient Instructions (Signed)
Your physician recommends that you schedule a follow-up appointment as needed.  Your physician has recommended that you have a pulmonary function test. Pulmonary Function Tests are a group of tests that measure how well air moves in and out of your lungs. This will be done at [facility]. You will receive the results via mail or telephone call.

## 2016-01-19 ENCOUNTER — Encounter (HOSPITAL_COMMUNITY): Payer: Medicare Other

## 2016-01-26 ENCOUNTER — Ambulatory Visit (HOSPITAL_COMMUNITY)
Admission: RE | Admit: 2016-01-26 | Discharge: 2016-01-26 | Disposition: A | Discharge status: Home / Self Care | Payer: Medicare Other | Source: Ambulatory Visit | Attending: Cardiology | Admitting: Cardiology

## 2016-01-26 DIAGNOSIS — R0602 Shortness of breath: Secondary | ICD-10-CM | POA: Insufficient documentation

## 2016-01-26 LAB — PULMONARY FUNCTION TEST
DL/VA % PRED: 93 %
DL/VA: 4.82 ml/min/mmHg/L
DLCO unc % pred: 77 %
DLCO unc: 21.89 ml/min/mmHg
FEF 25-75 POST: 4.9 L/s
FEF 25-75 Pre: 0.88 L/sec
FEF2575-%Change-Post: 455 %
FEF2575-%Pred-Post: 202 %
FEF2575-%Pred-Pre: 36 %
FEV1-%CHANGE-POST: 163 %
FEV1-%PRED-PRE: 45 %
FEV1-%Pred-Post: 119 %
FEV1-PRE: 1.26 L
FEV1-Post: 3.31 L
FEV1FVC-%Change-Post: 133 %
FEV1FVC-%PRED-PRE: 48 %
FEV6-%CHANGE-POST: 14 %
FEV6-%PRED-POST: 107 %
FEV6-%Pred-Pre: 93 %
FEV6-POST: 3.74 L
FEV6-Pre: 3.27 L
FEV6FVC-%CHANGE-POST: 1 %
FEV6FVC-%PRED-POST: 104 %
FEV6FVC-%Pred-Pre: 102 %
FVC-%CHANGE-POST: 12 %
FVC-%PRED-POST: 103 %
FVC-%Pred-Pre: 91 %
FVC-Post: 3.74 L
FVC-Pre: 3.32 L
PRE FEV6/FVC RATIO: 99 %
Post FEV1/FVC ratio: 89 %
Post FEV6/FVC ratio: 100 %
Pre FEV1/FVC ratio: 38 %
RV % PRED: 83 %
RV: 1.82 L
TLC % pred: 97 %
TLC: 5.37 L

## 2016-01-26 MED ORDER — ALBUTEROL SULFATE (2.5 MG/3ML) 0.083% IN NEBU
2.5000 mg | INHALATION_SOLUTION | Freq: Once | RESPIRATORY_TRACT | Status: AC
  Administered 2016-01-26: 2.5 mg via RESPIRATORY_TRACT

## 2016-02-01 ENCOUNTER — Telehealth: Payer: Self-pay | Admitting: *Deleted

## 2016-02-01 DIAGNOSIS — J439 Emphysema, unspecified: Secondary | ICD-10-CM

## 2016-02-01 NOTE — Telephone Encounter (Signed)
Pt has been made aware of her results from pft.  She has agreed to be referred to [facility].  Referral put in EPIC. Pt aware they will contact her with an appt.

## 2016-02-01 NOTE — Telephone Encounter (Signed)
-----   Message from Laurann Montanaayna N Dunn, [place] JerseyPA-C sent at 01/30/2016 10:43 AM EDT ----- Please let patient know PFTs were abnormal - severe obstructive airway disease suggesting emphysema, but possible reversible component as well. Please refer to pulmonology. Dayna Dunn PA-C

## 2016-03-01 ENCOUNTER — Telehealth: Payer: Self-pay | Admitting: Pulmonary Disease

## 2016-03-01 NOTE — Telephone Encounter (Signed)
PFT 01/27/16: FVC 3.32 L (91%) FEV1 1.26 L (45%) FEV1/FVC 0.38 FEF 25-75 0.88 L (36%) profound bronchodilator response TLC 5.37 L (97%) RV 83% ERV 59% DLCO uncorrected 77% (fully reversed obstruction with bronchodilator)  IMAGING CXR PA/LAT 11/13/15 (personally reviewed by me): No focal opacity or mass appreciated. No pleural effusion. Persistent elevation of right hemidiaphragm with deep sulcus went back to 2005 imaging upon my review. Heart normal in size & mediastinum normal in contour.  CARDIAC TTE (11/14/15): Mild LVH with EF 55-60%. No dilation of aorta. Atrial septum intact. Trivial pulmonic regurgitation. Trivial tricuspid regurgitation.  MICROBIOLOGY HIV (09/11/15):  Nonreactive  LABS 11/14/15 CBC: 11.2/12.7/38.0/183 BMP: 140/3.9/105/25/16/0.88/118/9.1 LFT: 4.1/7.1/1.8/88/37/32 UDS: Negative Troponin I:  <0.03

## 2016-03-02 ENCOUNTER — Encounter: Payer: Self-pay | Admitting: Pulmonary Disease

## 2016-03-02 ENCOUNTER — Ambulatory Visit (INDEPENDENT_AMBULATORY_CARE_PROVIDER_SITE_OTHER): Payer: Medicare Other | Admitting: Pulmonary Disease

## 2016-03-02 ENCOUNTER — Other Ambulatory Visit: Payer: Medicare Other

## 2016-03-02 DIAGNOSIS — J45909 Unspecified asthma, uncomplicated: Secondary | ICD-10-CM

## 2016-03-02 DIAGNOSIS — J986 Disorders of diaphragm: Secondary | ICD-10-CM | POA: Diagnosis not present

## 2016-03-02 MED ORDER — AEROCHAMBER MV MISC: 0 refills | Status: DC

## 2016-03-02 MED ORDER — BUDESONIDE-FORMOTEROL FUMARATE 160-4.5 MCG/ACT IN AERO: 2.0000 | INHALATION_SPRAY | Freq: Two times a day (BID) | RESPIRATORY_TRACT | 6 refills | Status: DC

## 2016-03-02 NOTE — Patient Instructions (Signed)
   Remember to brush your teeth, brush your tongue, gargle, rinse, and spit after you use your Symbicort inhaler to keep from getting thrush.  We will do a breathing test when you come back. Call me if you have any questions or problems with your medication.  You can continue to use your albuterol inhaler as needed for coughing, wheezing, or shortness of breath.  I will see you back in 8 weeks or sooner if needed.  TESTS ORDERED: 1. Spirometry with bronchodilator challenge and next appointment 2. Sniff Test 3. Serum RAST Panel & Alpha-1 antitrypsin phenotype today

## 2016-03-02 NOTE — Progress Notes (Signed)
Subjective:    Patient ID: Tamara Chambers, female    DOB: 11-May-1951, 65 y.o.   MRN: [id number]  HPI The patient reports she has been told she has emphysema and COPD. She feels like she has to concentrate on breathing. She feels her dyspnea is constant. Denies any coughing. Reports intermittent wheezing and "pinging, crackling" in her lungs. She reports as a child she had asthma. She reports previously she took Prednisone intermittently. She has primarily been treated with an Albuterol inhaler intermittently. She uses her Albuterol rescue inhaler intermittently for symptoms. She reports that she has more breathing problems with exposure to dust, smoke, and perfumes. She reports she doesn't sleep consistently due to her fibromyalgia. She reports she also has trouble breathing during times of high stress. She reports she took allergy shots previously. She has a history of sinus congestion & drainage but has no congestion or drainage recently. She reports she has a history of Nissen Fundoplication for a hiatal hernia. She reports her previous reflux which would come into her throat and cause aspiration has completely resolved. Denies any morning brash water taste. Denies any chest tpain or pressure. She occasional does have a "sharp, shocking" pain in her "heart" that is very brief and rare.  Review of Systems She reports she occasionally has fever & chills. She reports she has also had a constant sweat. She reports diffuse pain from her fibromyalgia that occurs with her sweats. A pertinent 14 point review of systems is negative except as per the history of presenting illness.  Allergies  Allergen Reactions  . Divalproex Sodium Anaphylaxis  . Codeine Nausea And Vomiting  . Neurontin [Gabapentin] Other (See Comments)    Cannot remember reaction.  . Wellbutrin [Bupropion Hcl] Other (See Comments)    seizure    Current Outpatient Prescriptions on File Prior to Visit  Medication Sig Dispense Refill   . albuterol (PROVENTIL,VENTOLIN) 90 MCG/ACT inhaler Inhale 2 puffs into the lungs as needed.      Marland Kitchen aspirin EC 81 MG EC tablet Take 1 tablet (81 mg total) by mouth daily.    Marland Kitchen ketorolac (TORADOL) 10 MG tablet Take 10 mg by mouth every 6 (six) hours as needed for moderate pain or severe pain.    Marland Kitchen lisinopril (PRINIVIL,ZESTRIL) 20 MG tablet Take 20 mg by mouth daily.    . nitroGLYCERIN (NITROSTAT) 0.4 MG SL tablet Place 0.4 mg under the tongue as directed.    Marland Kitchen oxyCODONE (ROXICODONE) 5 MG immediate release tablet Take 1 tablet (5 mg total) by mouth every 6 (six) hours as needed for severe pain. (Patient taking differently: Take 5 mg by mouth 2 (two) times daily. ) 20 tablet 0  . phenytoin (DILANTIN) 100 MG ER capsule Take 100 mg by mouth 3 (three) times daily.    . polyethylene glycol (MIRALAX / GLYCOLAX) packet Take 17 g by mouth daily.     No current facility-administered medications on file prior to visit.     Past Medical History:  Diagnosis Date  . Anemia   . Anxiety   . Arthritis   . Asthma   . Diabetes mellitus   . Emphysema of lung (HCC)   . Fibromyalgia   . GERD (gastroesophageal reflux disease)   . Hyperlipidemia   . Hypertension   . Neuromuscular disorder (HCC)   . Polio   . Seizures (HCC)   . Thyroid disease     Past Surgical History:  Procedure Laterality Date  . ABDOMINAL HYSTERECTOMY  1992   partial  . APPENDECTOMY    . CESAREAN SECTION  1984  . CHOLECYSTECTOMY N/A 09/13/2015   Procedure: LAPAROSCOPIC CHOLECYSTECTOMY;  Surgeon: Harriette Bouillon, MD;  Location: [facility] OR;  Service: General;  Laterality: N/A;  . ERCP N/A 09/12/2015   Procedure: ENDOSCOPIC RETROGRADE CHOLANGIOPANCREATOGRAPHY (ERCP);  Surgeon: Dorena Cookey, MD;  Location: [facility] ENDOSCOPY;  Service: Endoscopy;  Laterality: N/A;  . FEMUR DISTAL LOCKING SCREW INSERTION  2004   left foot  . LAPAROSCOPIC NISSEN FUNDOPLICATION  05/15/11   lap nissen and niatal hernia   . OOPHORECTOMY  2004  . TONSILLECTOMY AND  ADENOIDECTOMY  1968    Family History  Problem Relation Age of Onset  . Heart failure Mother   . Stroke Mother   . Cirrhosis Father   . Arthritis Sister   . Emphysema Brother   . Emphysema Cousin   . Stroke Maternal Grandmother   . Asthma Maternal Aunt   . Diabetes Maternal Aunt     Social History   Social History  . Marital status: Divorced    Spouse name: N/A  . Number of children: N/A  . Years of education: N/A   Social History Main Topics  . Smoking status: Former Smoker    Packs/day: 1.00    Years: 23.00    Types: Cigarettes    Start date: 07/16/1971    Quit date: 04/26/2004  . Smokeless tobacco: Never Used  . Alcohol use No  . Drug use: No  . Sexual activity: Not Asked   Other Topics Concern  . None   Social History Narrative   Originally from [facility]. Has primarily lived in [place]. She has also lived in [place]. 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 No pets currently. Does take her care of her brother's cat occasionally. Remote bird exposure to a parrot as a child. She reports she does have mold in her home found recently. Does have carpet through her whole home & bedrooms.       Objective:   Physical Exam BP 140/80 (BP Location: Left Arm, Cuff Size: Normal)   Pulse 60   Ht  (1.702 m)   Wt 219 lb (99.3 kg)   SpO2 97%   BMI 34.30 kg/m  General:  Awake. Alert. No acute distress. Obese female. Integument:  Warm & dry. No rash on exposed skin. No bruising. Lymphatics:  No appreciated cervical or supraclavicular lymphadenoapthy. HEENT:  Moist mucus membranes. No oral ulcers. No scleral injection or icterus. Mild bilateral nasal turbinate swelling. Cardiovascular:  Regular rate. No edema. Normal S1 & S2.  Pulmonary:  Good aeration & clear to auscultation bilaterally. Symmetric chest wall expansion. No accessory muscle use. Abdomen: Soft. Normal bowel sounds. Nondistended. Grossly nontender. Musculoskeletal:  Normal bulk and tone.  Hand grip strength 5/5 bilaterally. No joint deformity or effusion appreciated. Neurological:  CN 2-12 grossly in tact. No meningismus. Moving all 4 extremities equally. Symmetric brachioradialis deep tendon reflexes. Psychiatric:  Mood and affect congruent. Speech normal rhythm, rate & tone.   PFT 01/27/16: FVC 3.32 L (91%) FEV1 1.26 L (45%) FEV1/FVC 0.38 FEF 25-75 0.88 L (36%) profound bronchodilator response TLC 5.37 L (97%) RV 83% ERV 59% DLCO uncorrected 77% (fully reversed obstruction with bronchodilator)  IMAGING CXR PA/LAT 11/13/15 (personally reviewed by me): No focal opacity or mass appreciated. No pleural effusion. Persistent elevation of right hemidiaphragm with deep sulcus went back to 2005 imaging upon my review. Heart normal in size &  mediastinum normal in contour.  CARDIAC TTE (11/14/15): Mild LVH with EF 55-60%. No dilation of aorta. Atrial septum intact. Trivial pulmonic regurgitation. Trivial tricuspid regurgitation.  MICROBIOLOGY HIV (09/11/15):  Nonreactive  LABS 11/14/15 CBC: 11.2/12.7/38.0/183 BMP: 140/3.9/105/25/16/0.88/118/9.1 LFT: 4.1/7.1/1.8/88/37/32 UDS: Negative Troponin I:  <0.03    Assessment & Plan:  65 y.o. female with History of environmental allergies previously on immunotherapy. Patient also endorses history of polio requiring negative pressure ventilation with iron lung previously. Reviewing patient's chest x-ray shows elevation of the right hemidiaphragm that could quite possibly be due to a partial or complete paralysis potentially secondary to her previous polio. I personally reviewed her polio function testing which shows complete resolution of airway obstruction consistent with asthma and discussed the pathophysiology of asthma at length with the patient and her mother who was present. I believe she would benefit from inhaled steroid therapy as well as long-acting bronchodilator for control of her underlying symptoms secondary to it. I instructed the  patient to contact my office if she had any new breathing problems or questions before her next appointment.  1. Asthma: Starting patient on Symbicort 160/4.5 with a spacer. She will continue to use her albuterol inhaler on an as-needed basis. Checking alpha-1 antitrypsin phenotype today along with serum RAST panel. Plan to repeat spirometry with bronchodilator challenge at next appointment. 2. Elevated Right Hemidiaphragm:  Checking sniff test. 3. Health Maintenance:  Declines immunizations. 4. Follow-up: Return to clinic in 8 weeks or sooner if needed.  Donna Christen Jamison Neighbor, M.D. [facility] Pulmonary & Critical Care Pager:  [phone] After 3pm or if no response, call [phone] 12:50 PM 03/02/16

## 2016-03-02 NOTE — Addendum Note (Signed)
Addended by: Marvene Staff H on: 03/02/2016 12:57 PM   Modules accepted: Orders

## 2016-03-05 LAB — RESPIRATORY ALLERGY PROFILE REGION II ~~LOC~~
ALLERGEN, D PTERNOYSSINUS, D1: 27.4 kU/L — AB
Allergen, A. alternata, m6: <0.1 kU/L
Allergen, Cedar tree, t12: 0.1 kU/L — ABNORMAL HIGH
Allergen, Mouse Urine Protein, e78: <0.1 kU/L
Allergen, P. notatum, m1: <0.1 kU/L
Aspergillus fumigatus, m3: <0.1 kU/L
Box Elder IgE: 1.22 kU/L — ABNORMAL HIGH
CAT DANDER: 2.71 kU/L — AB
COCKROACH: 0.16 kU/L — AB
Common Ragweed: <0.1 kU/L
D. FARINAE: 22 kU/L — AB
DOG DANDER: 2.92 kU/L — AB
IGE (IMMUNOGLOBULIN E), SERUM: 715 kU/L — AB (ref <115)
Johnson Grass: <0.1 kU/L
Pecan/Hickory Tree IgE: <0.1 kU/L
Rough Pigweed  IgE: <0.1 kU/L
Sheep Sorrel IgE: <0.1 kU/L
Timothy Grass: <0.1 kU/L

## 2016-03-06 LAB — ALPHA-1 ANTITRYPSIN PHENOTYPE: A-1 Antitrypsin: 134 mg/dL (ref 83–199)

## 2016-03-07 ENCOUNTER — Ambulatory Visit (HOSPITAL_COMMUNITY)
Admission: RE | Admit: 2016-03-07 | Discharge: 2016-03-07 | Disposition: A | Discharge status: Home / Self Care | Payer: Medicare Other | Source: Ambulatory Visit | Attending: Pulmonary Disease | Admitting: Pulmonary Disease

## 2016-03-07 DIAGNOSIS — J986 Disorders of diaphragm: Secondary | ICD-10-CM | POA: Diagnosis present

## 2016-03-08 ENCOUNTER — Telehealth: Payer: Self-pay | Admitting: Pulmonary Disease

## 2016-03-08 NOTE — Progress Notes (Signed)
Called and spoke to pt. Informed her of the results and recs per JN. Pt verbalized understanding and denied any further questions or concerns at this time.  

## 2016-03-08 NOTE — Telephone Encounter (Signed)
Called and spoke to pt. Informed her of the results and recs per JN. Pt verbalized understanding and denied any further questions or concerns at this time.   Notes Recorded by Roslynn AmbleJennings E Nestor, MD on 03/07/2016 at 5:04 PM EDT Please let the patient know that her sniff test did show at least partial right hemidiaphragm paralysis as we suspected. Thanks.

## 2016-05-02 ENCOUNTER — Telehealth: Payer: Self-pay | Admitting: Pulmonary Disease

## 2016-05-02 NOTE — Telephone Encounter (Signed)
Spoke with pt. She needs to reschedule her appointments that she currently has. While speaking to the pt she was in no distress. Pt wants to have both appointments scheduled. These have been rescheduled to 06/15/16 at 1:30pm and 2:00pm. Nothing further was needed at this time.

## 2016-05-02 NOTE — Telephone Encounter (Signed)
Pt is scheduled for OV 05/04/16 with JN with a PFT on 05/03/16.  Pt states that she has not been taking the Symbicort as directed x 1 month. Has not taken since 04/02/16. Pt having increased breathing issues. Wants to know if she needs to reschedule to PFT to when her breathing is better and once she begins the Symbicort again.   Please advise Dr Jamison NeighborNestor. Thanks.

## 2016-05-04 ENCOUNTER — Ambulatory Visit: Payer: Medicare Other | Admitting: Pulmonary Disease

## 2016-06-05 ENCOUNTER — Other Ambulatory Visit: Payer: Self-pay | Admitting: Family Medicine

## 2016-06-05 DIAGNOSIS — R1031 Right lower quadrant pain: Secondary | ICD-10-CM

## 2016-06-11 ENCOUNTER — Ambulatory Visit
Admission: RE | Admit: 2016-06-11 | Discharge: 2016-06-11 | Disposition: A | Discharge status: Home / Self Care | Payer: Medicare Other | Source: Ambulatory Visit | Attending: Family Medicine | Admitting: Family Medicine

## 2016-06-11 DIAGNOSIS — R1031 Right lower quadrant pain: Secondary | ICD-10-CM

## 2016-06-11 MED ORDER — IOPAMIDOL (ISOVUE-300) INJECTION 61%
100.0000 mL | Freq: Once | INTRAVENOUS | Status: AC | PRN
  Administered 2016-06-11: 100 mL via INTRAVENOUS

## 2016-06-15 ENCOUNTER — Encounter: Payer: Self-pay | Admitting: Pulmonary Disease

## 2016-06-15 ENCOUNTER — Ambulatory Visit (INDEPENDENT_AMBULATORY_CARE_PROVIDER_SITE_OTHER): Payer: Medicare Other | Admitting: Pulmonary Disease

## 2016-06-15 VITALS — BP 144/76 | HR 62 | Ht 67.0 in | Wt 229.0 lb

## 2016-06-15 DIAGNOSIS — J45909 Unspecified asthma, uncomplicated: Secondary | ICD-10-CM | POA: Diagnosis not present

## 2016-06-15 DIAGNOSIS — J986 Disorders of diaphragm: Secondary | ICD-10-CM

## 2016-06-15 DIAGNOSIS — J4520 Mild intermittent asthma, uncomplicated: Secondary | ICD-10-CM | POA: Diagnosis not present

## 2016-06-15 LAB — PULMONARY FUNCTION TEST
FEF 25-75 POST: 4.33 L/s
FEF 25-75 PRE: 4.69 L/s
FEF2575-%CHANGE-POST: -7 %
FEF2575-%PRED-POST: 180 %
FEF2575-%Pred-Pre: 195 %
FEV1-%CHANGE-POST: -1 %
FEV1-%PRED-POST: 111 %
FEV1-%Pred-Pre: 112 %
FEV1-Post: 3.09 L
FEV1-Pre: 3.12 L
FEV1FVC-%CHANGE-POST: -1 %
FEV1FVC-%PRED-PRE: 113 %
FEV6-%CHANGE-POST: 0 %
FEV6-%Pred-Post: 103 %
FEV6-%Pred-Pre: 102 %
FEV6-PRE: 3.56 L
FEV6-Post: 3.58 L
FEV6FVC-%Change-Post: 0 %
FEV6FVC-%PRED-PRE: 103 %
FEV6FVC-%Pred-Post: 104 %
FVC-%CHANGE-POST: 0 %
FVC-%Pred-Post: 99 %
FVC-%Pred-Pre: 99 %
FVC-Post: 3.58 L
FVC-Pre: 3.58 L
POST FEV1/FVC RATIO: 86 %
Post FEV6/FVC ratio: 100 %
Pre FEV1/FVC ratio: 87 %
Pre FEV6/FVC Ratio: 100 %

## 2016-06-15 MED ORDER — BUDESONIDE-FORMOTEROL FUMARATE 80-4.5 MCG/ACT IN AERO: 2.0000 | INHALATION_SPRAY | Freq: Two times a day (BID) | RESPIRATORY_TRACT | 3 refills | Status: DC

## 2016-06-15 MED ORDER — MONTELUKAST SODIUM 10 MG PO TABS: 10.0000 mg | ORAL_TABLET | Freq: Every day | ORAL | 3 refills | Status: DC

## 2016-06-15 NOTE — Patient Instructions (Signed)
   Call me if you have any new breathing problems or questions before your next appointment.  I will see you back in 3 months or sooner if needed.  TESTS ORDERED: 1. Spirometry with bronchodilator challenge at next appointment

## 2016-06-15 NOTE — Progress Notes (Signed)
Subjective:    Patient ID: Tamara Chambers, female    DOB: [birth year], 65 y.o.   MRN: [id number]  C.C.:  Follow-up for Asthma & Partial Right Hemidiaphragm Paralysis.  HPI Asthma:  Started on Symbicort at last appointment. She reports she stopped using her Symbicort after 30 days with "blisters" in her mouth and then restarted 30 days ago. She has also reported gaining weight. So far she has had no blisters in her mouth. She reports she hasn't need to use her rescue inhaler since she has taken her Symbicort. No nocturnal awakenings with any breathing problems. She reports no coughing. She reports minimal wheezing. No exacerbations since last appointment.   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 She also previously had surgical repair of her hiatal hernia with questionable suturing to the diaphragm.   Review of Systems No fever or chills. She has chronic sweats. Reports her chronic pain form her fibromyalgia. No joint erythema or swelling. No chest pain but does occasionally have chest pressure.   Allergies  Allergen Reactions  . Divalproex Sodium Anaphylaxis  . Codeine Nausea And Vomiting  . Neurontin [Gabapentin] Other (See Comments)    Cannot remember reaction.  . Wellbutrin [Bupropion Hcl] Other (See Comments)    seizure    Current Outpatient Prescriptions on File Prior to Visit  Medication Sig Dispense Refill  . albuterol (PROVENTIL,VENTOLIN) 90 MCG/ACT inhaler Inhale 2 puffs into the lungs as needed.      Marland Kitchen. aspirin EC 81 MG EC tablet Take 1 tablet (81 mg total) by mouth daily.    . budesonide-formoterol (SYMBICORT) 160-4.5 MCG/ACT inhaler Inhale 2 puffs into the lungs 2 (two) times daily. 1 Inhaler 6  . docusate sodium (COLACE) 100 MG capsule Take 100 mg by mouth 2 (two) times daily.    Marland Kitchen. ketorolac (TORADOL) 10 MG tablet Take 10 mg by mouth every 6 (six)  hours as needed for moderate pain or severe pain.    Marland Kitchen. lisinopril (PRINIVIL,ZESTRIL) 20 MG tablet Take 20 mg by mouth daily.    . nitroGLYCERIN (NITROSTAT) 0.4 MG SL tablet Place 0.4 mg under the tongue as directed.    Marland Kitchen. oxyCODONE (ROXICODONE) 5 MG immediate release tablet Take 1 tablet (5 mg total) by mouth every 6 (six) hours as needed for severe pain. (Patient taking differently: Take 5 mg by mouth 2 (two) times daily. ) 20 tablet 0  . phenytoin (DILANTIN) 100 MG ER capsule Take 100 mg by mouth 3 (three) times daily.    . polyethylene glycol (MIRALAX / GLYCOLAX) packet Take 17 g by mouth daily.    Marland Kitchen. Spacer/Aero-Holding Chambers (AEROCHAMBER MV) inhaler Use as instructed 1 each 0   No current facility-administered medications on file prior to visit.     Past Medical History:  Diagnosis Date  . Anemia   . Anxiety   . Arthritis   . Asthma   . Diabetes mellitus   . Emphysema of lung (HCC)   . Fibromyalgia   . GERD (gastroesophageal reflux disease)   . Hyperlipidemia   . Hypertension   . Neuromuscular disorder (HCC)   . Polio   . Seizures (HCC)   . Thyroid disease     Past Surgical History:  Procedure Laterality Date  . ABDOMINAL HYSTERECTOMY  1992   partial  . APPENDECTOMY    . CESAREAN SECTION  1984  . CHOLECYSTECTOMY N/A 09/13/2015   Procedure:  LAPAROSCOPIC CHOLECYSTECTOMY;  Surgeon: Harriette Bouillonhomas Cornett, MD;  [facility];  Service: General;  Laterality: N/A;  . ERCP N/A 09/12/2015   Procedure: ENDOSCOPIC RETROGRADE CHOLANGIOPANCREATOGRAPHY (ERCP);  Surgeon: Dorena CookeyJohn Hayes, MD;  [facility];  Service: Endoscopy;  Laterality: N/A;  . FEMUR DISTAL LOCKING SCREW INSERTION  2004   left foot  . LAPAROSCOPIC NISSEN FUNDOPLICATION  05/15/11   lap nissen and niatal hernia   . OOPHORECTOMY  2004  . TONSILLECTOMY AND ADENOIDECTOMY  1968    Family History  Problem Relation Age of Onset  . Heart failure Mother   . Stroke Mother   . Cirrhosis Father   . Arthritis Sister     . Emphysema Brother   . Emphysema Cousin   . Stroke Maternal Grandmother   . Asthma Maternal Aunt   . Diabetes Maternal Aunt     Social History   Social History  . Marital status: Divorced    Spouse name: N/A  . Number of children: N/A  . Years of education: N/A   Social History Main Topics  . Smoking status: Former Smoker    Packs/day: 1.00    Years: 23.00    Types: Cigarettes    Start date: 07/16/1971    Quit date: 04/26/2004  . Smokeless tobacco: Never Used  . Alcohol use No  . Drug use: No  . Sexual activity: Not on file   Other Topics Concern  . Not on file   Social History Narrative   Originally [place]. Has primarily lived in [place]. She has also lived in [place]. 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 No pets currently. Does take her care of her brother's cat occasionally. Remote bird exposure to a parrot as a child. She reports she does have mold in her home found recently. Does have carpet through her whole home & bedrooms.       Objective:   Physical Exam BP (!) 144/76 (BP Location: Left Arm, Cuff Size: Normal)   Pulse 62   SpO2 95%  General:  Awake. No distress. Obese female. Integument:  Warm & dry. No rash on exposed skin. No bruising. Lymphatics:  No appreciated cervical or supraclavicular lymphadenoapthy. HEENT:  Moist mucus membranes. No oral ulcers. No scleral injection or icterus. Mild bilateral nasal turbinate swelling. Cardiovascular:  Regular rate. No edema. Normal S1 & S2.  Pulmonary:  There with auscultation bilaterally. Normal work of breathing on room air. Speaking in complete sentences. Abdomen: Soft. Normal bowel sounds. Nontender. Protuberant.   PFT 06/15/16: FVC 3.58 L (99%) FEV1 3.12 L (112%) FEV1/FVC 0.87 FEF 25-75 4.69 L (195%) negative bronchodilator response 01/27/16: FVC 3.32 L (91%) FEV1 1.26 L (45%) FEV1/FVC 0.38 FEF 25-75 0.88 L (36%) profound bronchodilator response TLC 5.37 L (97%) RV 83% ERV 59% DLCO  uncorrected 77% (fully reversed obstruction with bronchodilator)  IMAGING SNIFF TEST 03/07/16 (per radiologist): Asymmetric diminished right hemidiaphragm motion suggestive of at least partial right hemidiaphragm paralysis.  CXR PA/LAT 11/13/15 (previously reviewed by me): No focal opacity or mass appreciated. No pleural effusion. Persistent elevation of right hemidiaphragm with deep sulcus went back to 2005 imaging upon my review. Heart normal in size & mediastinum normal in contour.  CARDIAC TTE (11/14/15): Mild LVH with EF 55-60%. No dilation of aorta. Atrial septum intact. Trivial pulmonic regurgitation. Trivial tricuspid regurgitation.  MICROBIOLOGY HIV (09/11/15):  Nonreactive  LABS 03/02/16 Alpha-1 antitrypsin: MM (134) RAST panel:  D farinae 22.0 / Dog dander 2.92 /  [place] 1.22 / D pternoyssinus 27.40 / Cat dander 2.71 / Multiple positives IgE:  715  11/14/15 CBC: 11.2/12.7/38.0/183 BMP: 140/3.9/105/25/16/0.88/118/9.1 LFT: 4.1/7.1/1.8/88/37/32 UDS: Negative Troponin I:  <0.03    Assessment & Plan:  65 y.o. female with history of environmental allergies previously on immunotherapy and asthma with partial right hemidiaphragm paralysis suggested on sniff test likely from her polio. Patient has no significant bronchodilator response and significant/3 fold improvement in her FEV1 on pulmonary function testing today indicating a dramatic response to inhaled corticosteroid/long-acting beta agonist therapy. I encouraged the patient to contact me if she had any new breathing problems. We did discuss her partial left hemidiaphragm paralysis at length and I feel it's likely due to a combination of her previous surgical repair of her hiatal hernia as well as her previous polio. I instructed the patient contact my office if she had any new breathing problems before her next appointment.  1. Asthma: Starting patient on Symbicort 80/4.5 & Singulair 10 mg by mouth daily at bedtime. Repeat  spirometry with bronchodilator challenge at next appointment. 2. 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 No further testing necessary.  3. Health Maintenance:  Declined immunizations in the past. 4. Follow-up: Return to clinic in 3 months or sooner if needed.  Donna Christen Jamison Neighbor, M.D. [facility] Pulmonary & Critical Care Pager:  [phone] After 3pm or if no response, call [phone] 2:12 PM 06/15/16

## 2016-09-19 ENCOUNTER — Telehealth: Payer: Self-pay | Admitting: Pulmonary Disease

## 2016-09-19 NOTE — Telephone Encounter (Signed)
Spoke with pt. States that she has been non compliant with taking Symbicort and Singulair. Pt has a PFT and OV on 09/21/16. She is wondering if she needs to cancel these due to not taking meds. Advised her that I would keep the appointments so that JN could answer the questions she has about these medications. She is afraid to start these medications due to possible side effects. PFT and OV have not been canceled at this time. Nothing further was needed.

## 2016-09-20 ENCOUNTER — Other Ambulatory Visit: Payer: Self-pay | Admitting: Pulmonary Disease

## 2016-09-20 DIAGNOSIS — R0600 Dyspnea, unspecified: Secondary | ICD-10-CM

## 2016-09-21 ENCOUNTER — Ambulatory Visit: Payer: Medicare Other | Admitting: Pulmonary Disease

## 2016-10-23 ENCOUNTER — Other Ambulatory Visit: Payer: Self-pay | Admitting: Family Medicine

## 2016-10-23 DIAGNOSIS — N6310 Unspecified lump in the right breast, unspecified quadrant: Secondary | ICD-10-CM

## 2016-10-29 ENCOUNTER — Ambulatory Visit
Admission: RE | Admit: 2016-10-29 | Discharge: 2016-10-29 | Disposition: A | Discharge status: Home / Self Care | Payer: Medicare Other | Source: Ambulatory Visit | Attending: Family Medicine | Admitting: Family Medicine

## 2016-10-29 DIAGNOSIS — N6310 Unspecified lump in the right breast, unspecified quadrant: Secondary | ICD-10-CM

## 2017-02-18 IMAGING — US US ABDOMEN LIMITED
1 series · 14 of 25 positions shown · non-contrast
Comparison: 12/01/2009 CT

CLINICAL DATA: 64-year-old female with acute abdominal pain for 2
days.

EXAM:
US ABDOMEN LIMITED - RIGHT UPPER QUADRANT

[Series 1: us abdomen limited · 0.23mm/px · 14 of 42 slices shown]
[im 1/42]
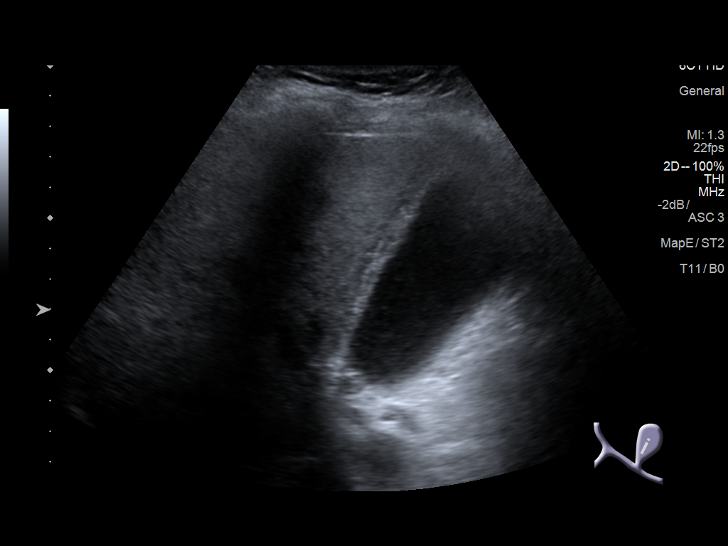
[im 4/42]
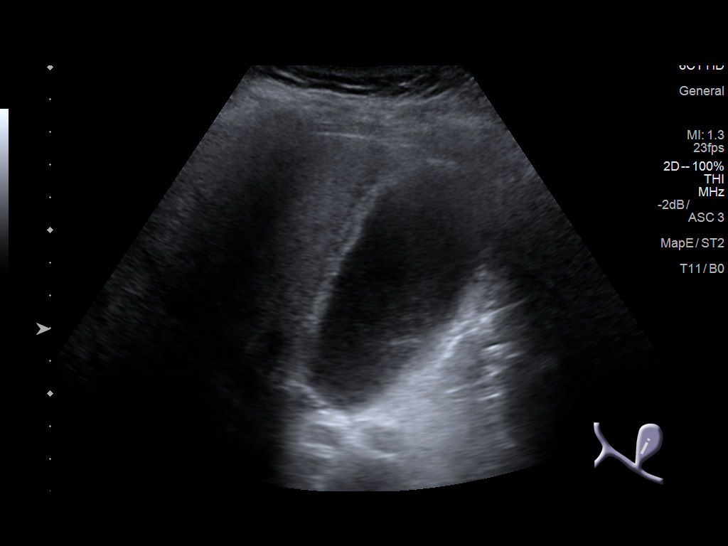
[im 7/42]
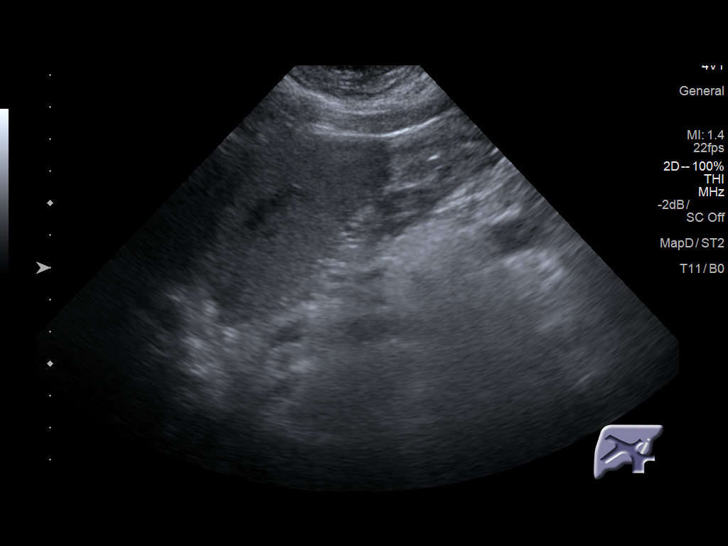
[im 11/42]
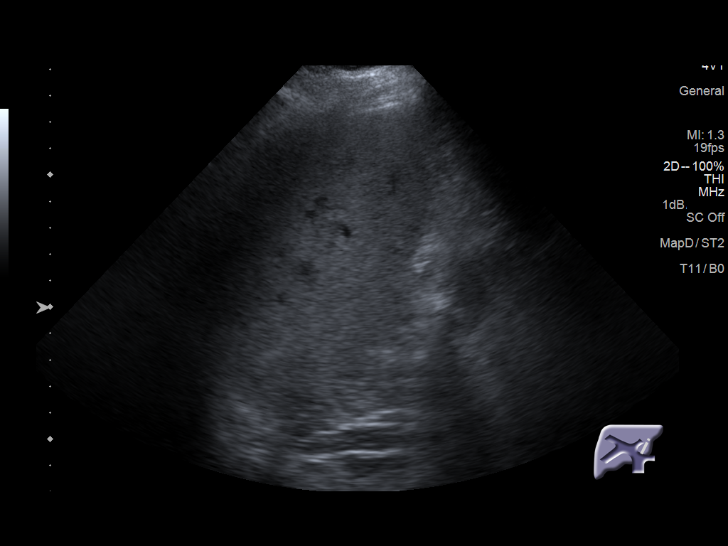
[im 14/42]
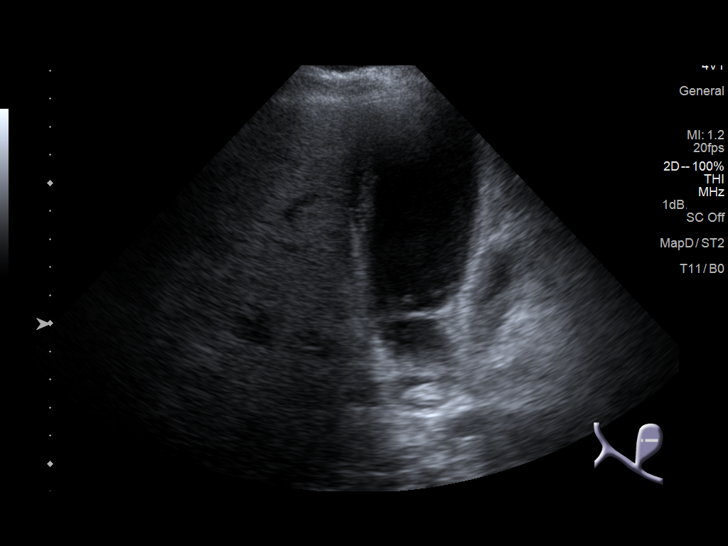
[im 16/42]
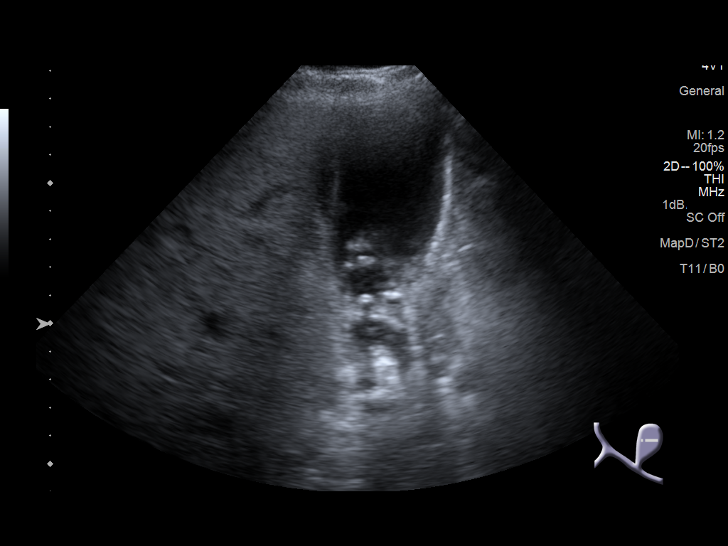
[im 19/42]
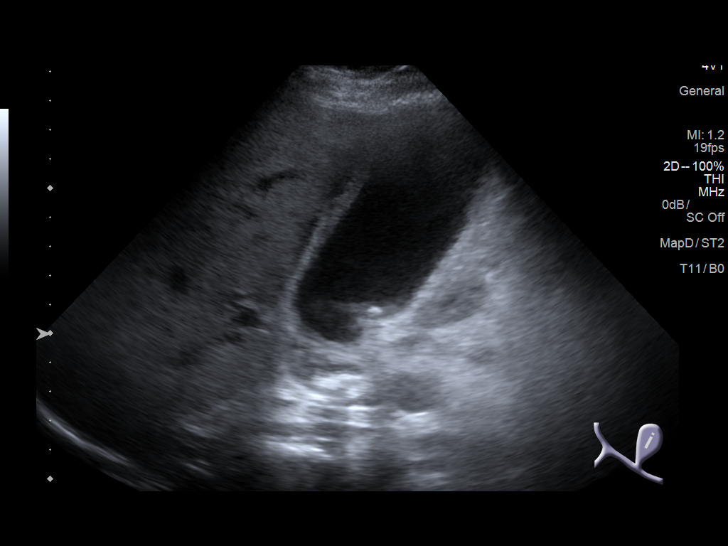
[im 23/42]
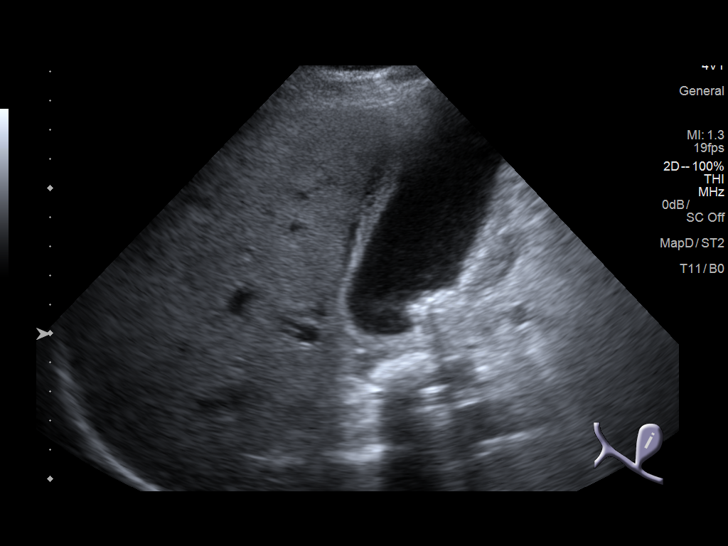
[im 26/42]
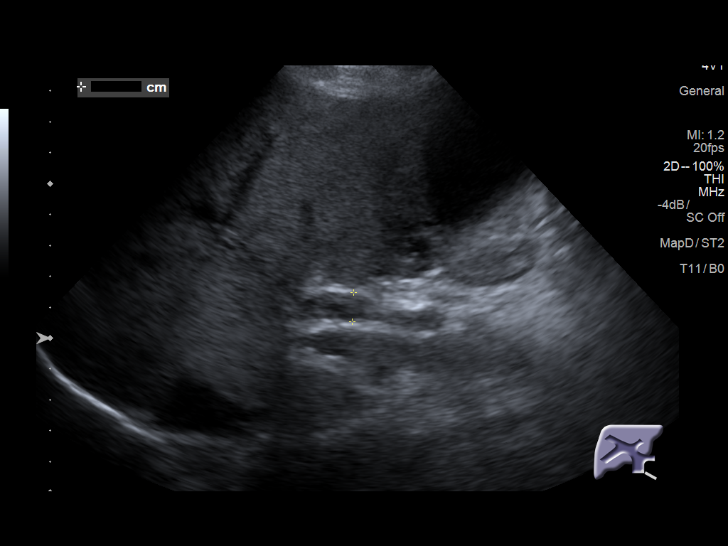
[im 28/42]
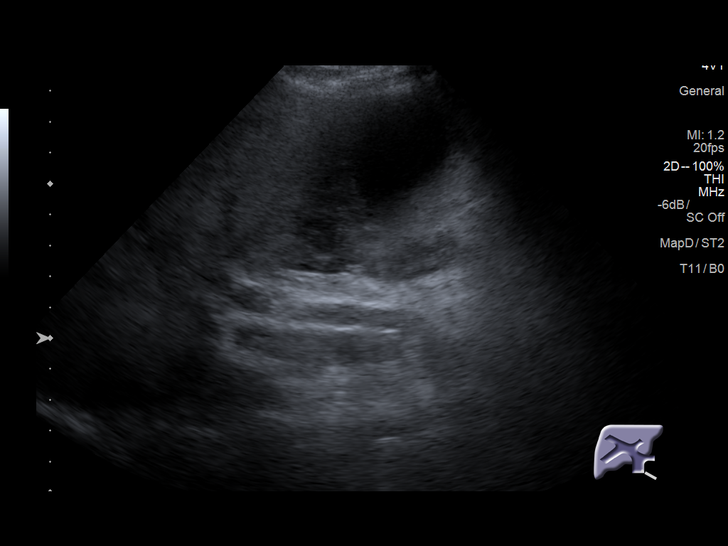
[im 31/42]
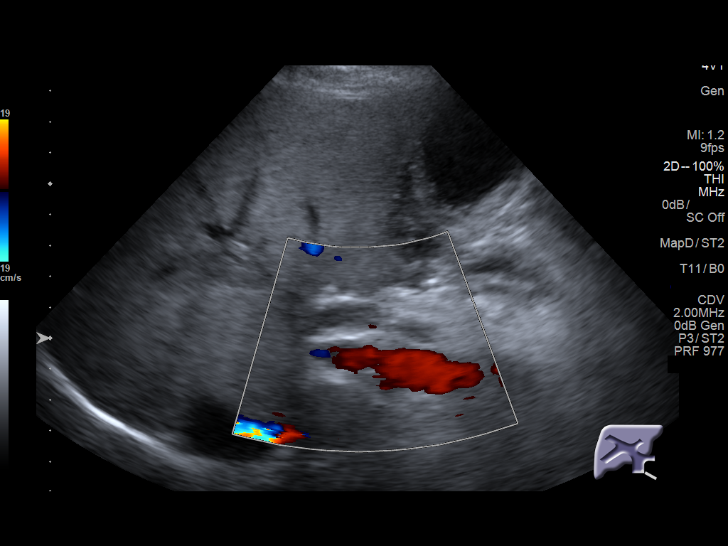
[im 35/42]
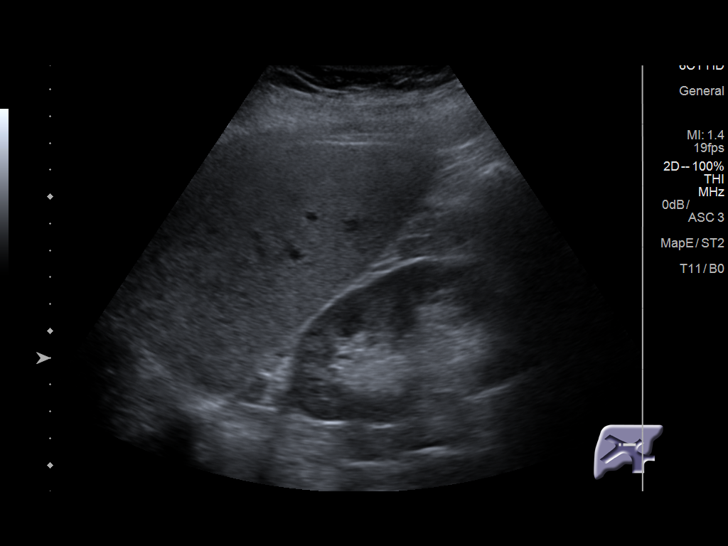
[im 38/42]
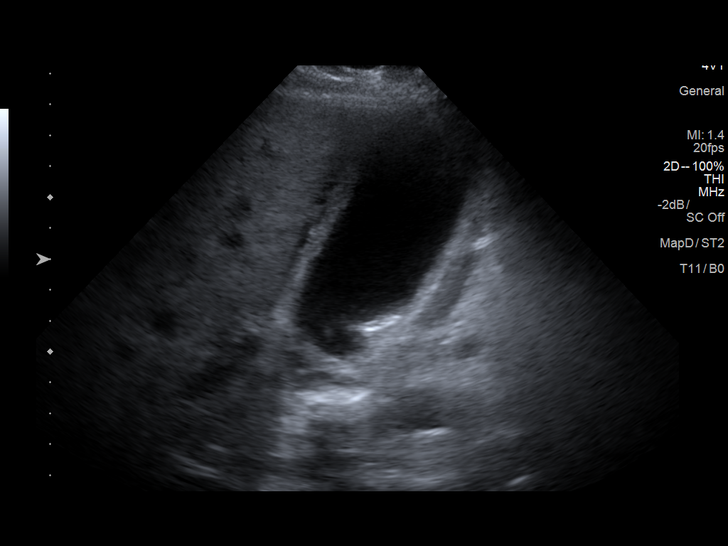
[im 42/42]
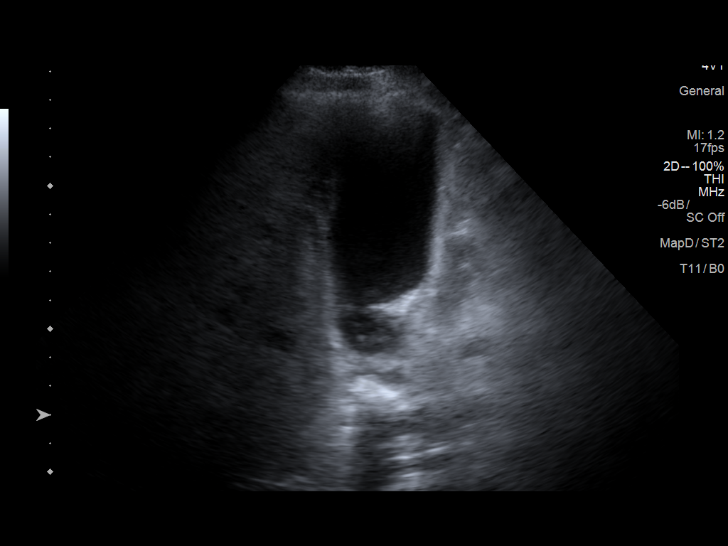

[14 of 25 positions shown; findings below may reference images not displayed]

FINDINGS: Gallbladder:

Gallstones are identified, with the largest measuring 2.3 cm at the
neck. Gallbladder wall thickening and small amount of
pericholecystic fluid noted.

Common bile duct:

Diameter: 9 mm. There is no evidence of intrahepatic biliary
dilatation. No definite choledocholithiasis are identified within
the visualized CBD

Liver:

Increased echogenicity of the liver is identified without focal
abnormality.
IMPRESSION: Cholelithiasis with gallbladder wall thickening and pericholecystic
fluid highly suspicious for acute cholecystitis. 2.3 cm gallstone at
the neck.

Mild CBD dilatation.

These results were discussed with Pierre, RN on 09/11/2015 at [DATE]
p.m..

## 2017-03-04 NOTE — Progress Notes (Deleted)
Cardiology Office Note    Date:  03/04/2017  ID:  [place], Huberty [birth year], MRN [id number] PCP:  Kaleen Mask, MD  Cardiologist: Dr. Eden Emms in initial consult remotely   Chief Complaint: chest pain  History of Present Illness:  Tamara Chambers is a 66 y.o. female with history of normal cath 2008, HTN, DM, anxiety, GERD, asthma, seziure disorder, former tobacco abuse who presents for f/u of chest pain.  To recap history, she was admitted to [facility] in April 2017 for syncope versus seizure. She had been cleaning and got hot. She went to take a shower because she was hot, and after being in there about 15 minutes, woke up on the floor. There was about a 2 hour gap between the shower and the call to her family. She did not have any prodrome. No chest pain, no presyncope, no palpitations. Work-up in the hospital was negative. She underwent MRI of the brain which was negative. She also underwent an EEG which was negative for any seizure-like activity. 2D echo 10/2015: mild LVH, EF 55-60%, otherwise unremarkable. She was seen by Dr. Katrinka Blazing in consultation who felt that her syncope was neurally mediated. 30 day monitor showed NSR with sinus bradycardia, average HR 66, no pauses >3 seconds, no arrhythmia to correlate with dizziness, chest pain, dizziness, and passing out. She's since been followed by neurology for suspected seizures. Last labs in 2017 showed LDL 97, Mg 2.1, WBC 11.2, Hgb 12.7, CMET ok except glucose 118 and Tbili 1.8.  no labs since 2017   Chest pain Essential HTN Seizure disorder Former tobacco use    Past Medical History:  Diagnosis Date  . Anemia   . Anxiety   . Arthritis   . Asthma   . Diabetes mellitus   . Emphysema of lung (HCC)   . Fibromyalgia   . GERD (gastroesophageal reflux disease)   . Hyperlipidemia   . Hypertension   . Neuromuscular disorder (HCC)   . Polio   . Seizures (HCC)   . Thyroid disease     Past Surgical History:    Procedure Laterality Date  . ABDOMINAL HYSTERECTOMY  1992   partial  . APPENDECTOMY    . BREAST EXCISIONAL BIOPSY Bilateral 01/19/1998  . CESAREAN SECTION  1984  . CHOLECYSTECTOMY N/A 09/13/2015   Procedure: LAPAROSCOPIC CHOLECYSTECTOMY;  Surgeon: Harriette Bouillon, MD;  Location[facility];  Service: General;  Laterality: N/A;  . ERCP N/A 09/12/2015   Procedure: ENDOSCOPIC RETROGRADE CHOLANGIOPANCREATOGRAPHY (ERCP);  Surgeon: Dorena Cookey, MD;  Location: [facility] ENDOSCOPY;  Service: Endoscopy;  Laterality: N/A;  . FEMUR DISTAL LOCKING SCREW INSERTION  2004   left foot  . LAPAROSCOPIC NISSEN FUNDOPLICATION  05/15/11   lap nissen and niatal hernia   . OOPHORECTOMY  2004  . TONSILLECTOMY AND ADENOIDECTOMY  1968    Current Medications: No outpatient prescriptions have been marked as taking for the 03/05/17 encounter (Appointment) with Laurann Montana, PA-C.     Allergies:   Divalproex sodium; Codeine; Neurontin [gabapentin]; and Wellbutrin [bupropion hcl]   Social History   Social History  . Marital status: Divorced    Spouse name: N/A  . Number of children: N/A  . Years of education: N/A   Social History Main Topics  . Smoking status: Former Smoker    Packs/day: 1.00    Years: 23.00    Types: Cigarettes    Start date: 07/16/1971    Quit date: 04/26/2004  . Smokeless tobacco: Never  Used  . Alcohol use No  . Drug use: No  . Sexual activity: Not on file   Other Topics Concern  . Not on file   Social History Narrative   Originally from [place]. Has primarily lived in [place]. She has also lived in [place]. 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 No pets currently. Does take her care of her brother's cat occasionally. Remote bird exposure to a parrot as a child. She reports she does have mold in her home found recently. Does have carpet through her whole home & bedrooms.      Family History:  Family History  Problem Relation Age of Onset  . Heart failure Mother   .  Stroke Mother   . Cirrhosis Father   . Arthritis Sister   . Emphysema Brother   . Emphysema Cousin   . Stroke Maternal Grandmother   . Asthma Maternal Aunt   . Diabetes Maternal Aunt    ***  ROS:   Please see the history of present illness. Otherwise, review of systems is positive for ***.  All other systems are reviewed and otherwise negative.    PHYSICAL EXAM:   VS:  There were no vitals taken for this visit.  BMI: There is no height or weight on file to calculate BMI. GEN: Well nourished, well developed, in no acute distress  HEENT: normocephalic, atraumatic Neck: no JVD, carotid bruits, or masses Cardiac: ***RRR; no murmurs, rubs, or gallops, no edema  Respiratory:  clear to auscultation bilaterally, normal work of breathing GI: soft, nontender, nondistended, + BS MS: no deformity or atrophy  Skin: warm and dry, no rash Neuro:  Alert and Oriented x 3, Strength and sensation are intact, follows commands Psych: euthymic mood, full affect  Wt Readings from Last 3 Encounters:  06/15/16 229 lb (103.9 kg)  03/02/16 219 lb (99.3 kg)  01/05/16 214 lb 3.2 oz (97.2 kg)      Studies/Labs Reviewed:   EKG:  EKG was ordered today and personally reviewed by me and demonstrates *** EKG was not ordered today.***  Recent Labs: No results found for requested labs within last 8760 hours.   Lipid Panel    Component Value Date/Time   CHOL 165 11/15/2015 0640   TRIG 165 (H) 11/15/2015 0640   HDL 35 (L) 11/15/2015 0640   CHOLHDL 4.7 11/15/2015 0640   VLDL 33 11/15/2015 0640   LDLCALC 97 11/15/2015 0640    Additional studies/ records that were reviewed today include: Summarized above.***    ASSESSMENT & PLAN:   1. ***  Disposition: F/u with ***   Medication Adjustments/Labs and Tests Ordered: Current medicines are reviewed at length with the patient today.  Concerns regarding medicines are outlined above. Medication changes, Labs and Tests ordered today are summarized  above and listed in the Patient Instructions accessible in Encounters.   Signed, Laurann Montanaayna N Dunn, PA-C  03/04/2017 8:17 AM    [facility] [address], [place], [place]  [address] Phone: [phone]; Fax: [phone]

## 2017-03-05 ENCOUNTER — Ambulatory Visit: Payer: Medicare Other | Admitting: Physician Assistant

## 2017-03-07 ENCOUNTER — Encounter: Payer: Self-pay | Admitting: Physician Assistant

## 2017-06-26 IMAGING — CR DG CHEST 2V
2 series · 2 of 2 positions shown · non-contrast
Comparison: 08/24/2013 and prior chest radiographs

CLINICAL DATA: 64-year-old female with acute chest pain for 2 days.

EXAM:
CHEST  2 VIEW

[chest pa]
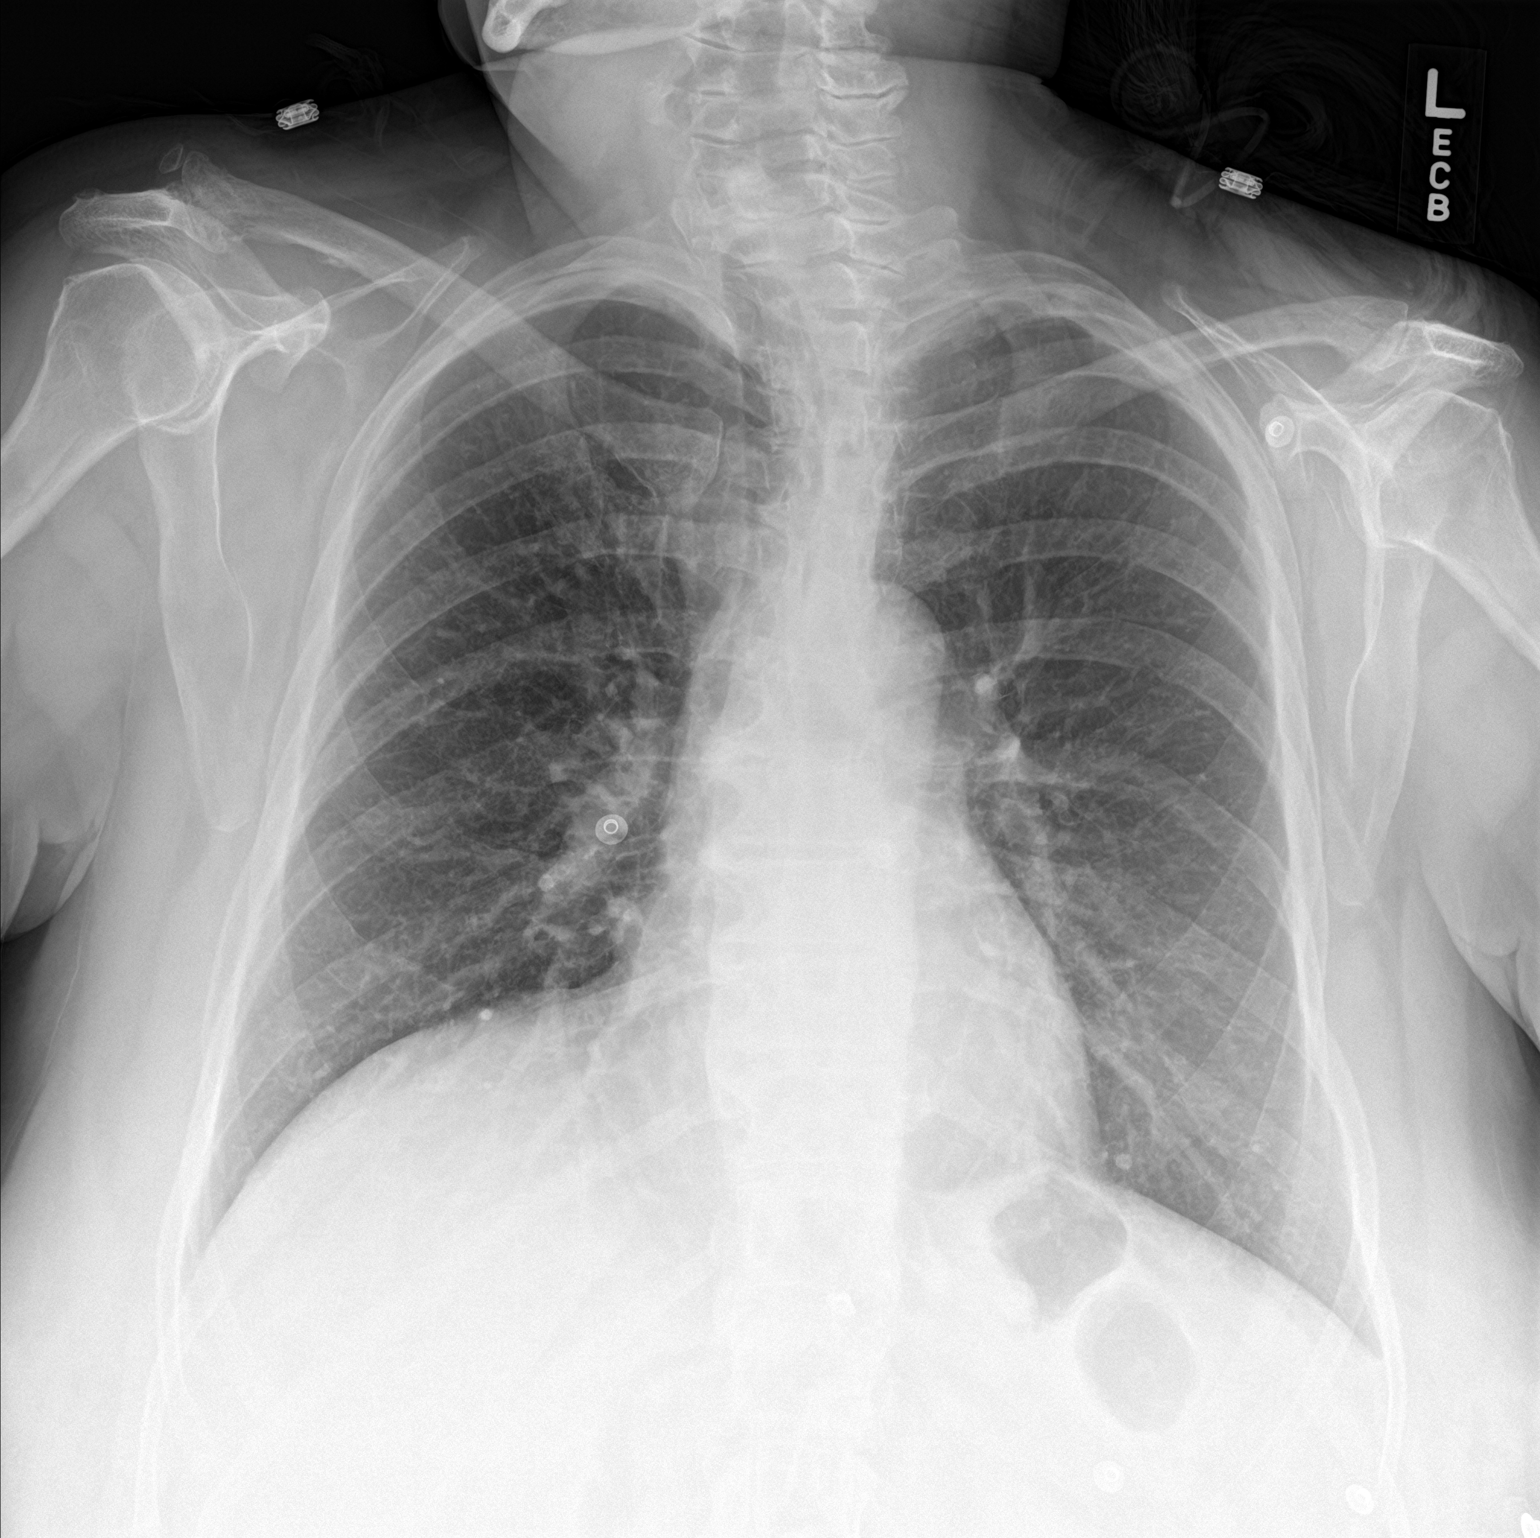

[chest lat]
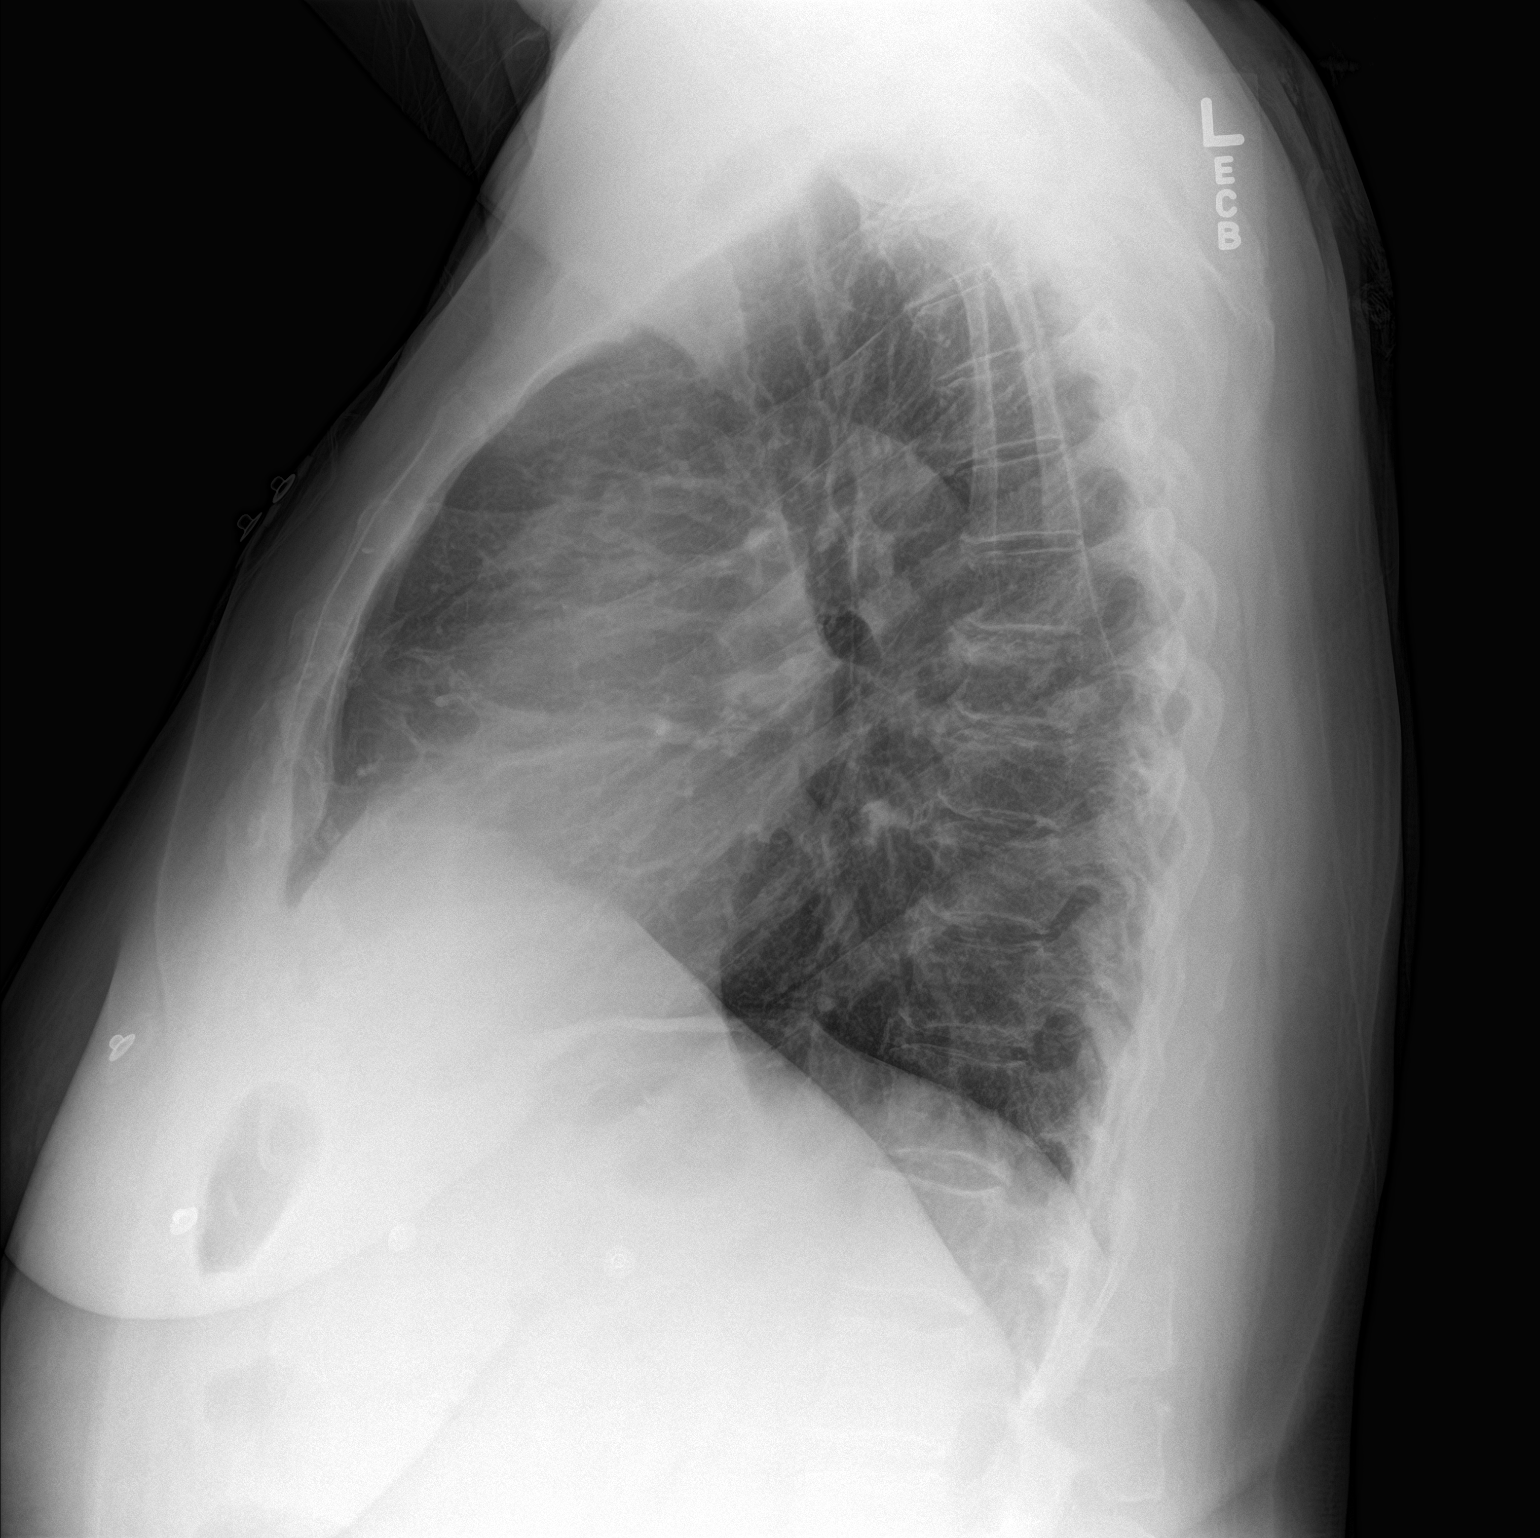

[2 of 2 positions shown; findings below may reference images not displayed]

FINDINGS: The cardiomediastinal silhouette is unremarkable.

Right hemidiaphragm elevation is unchanged.

Mild chronic peribronchial thickening again noted.

There is no evidence of focal airspace disease, pulmonary edema,
suspicious pulmonary nodule/mass, pleural effusion, or pneumothorax.
No acute bony abnormalities are identified.
IMPRESSION: No evidence of acute cardiopulmonary disease.

Mild chronic peribronchial thickening.

## 2017-06-27 IMAGING — RF DG ERCP WO/W SPHINCTEROTOMY
1 series · 3 of 3 positions shown · non-contrast
Comparison: Ultrasound 09/11/2015

CLINICAL DATA: 64-year-old female with a history of
choledocholithiasis

EXAM:
ERCP
TECHNIQUE: Multiple spot images obtained with the fluoroscopic device and
submitted for interpretation post-procedure.
FLUOROSCOPY TIME:  Fluoroscopy Time:  1 minutes 32 seconds

[Series 1: run · 3 of 3 slices shown]
[im 1/3]
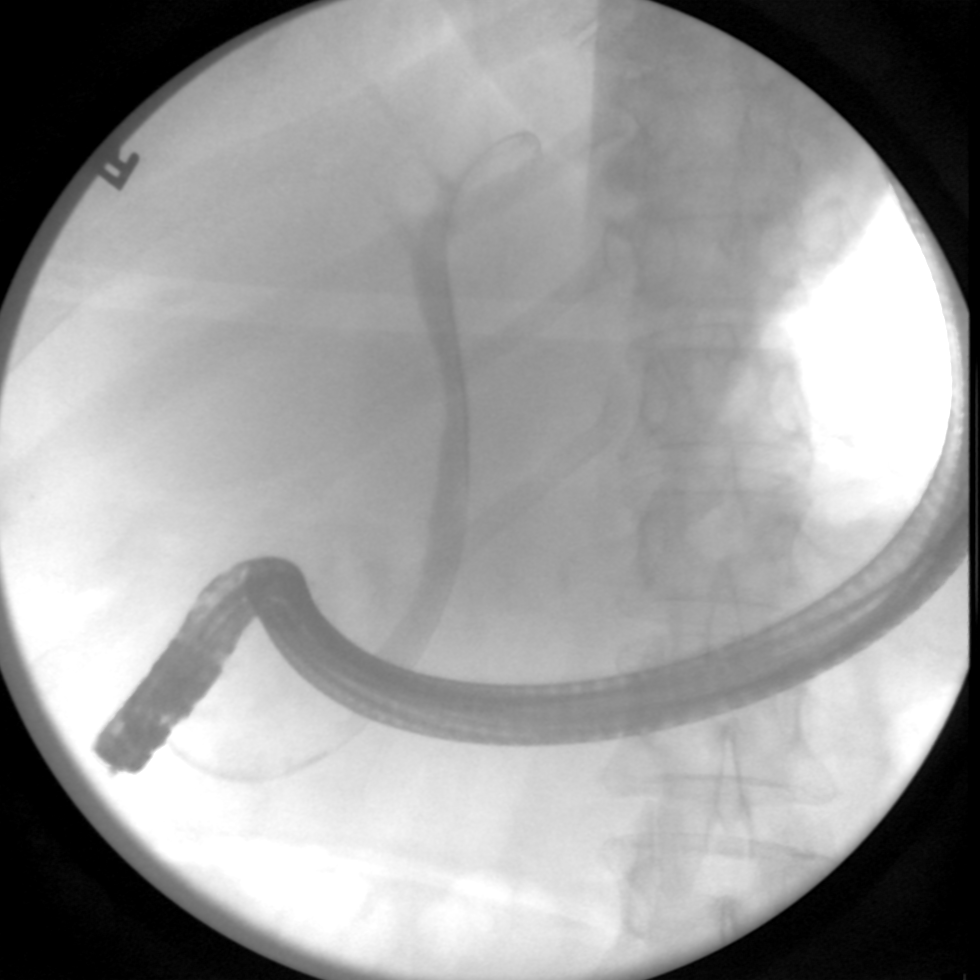
[im 2/3]
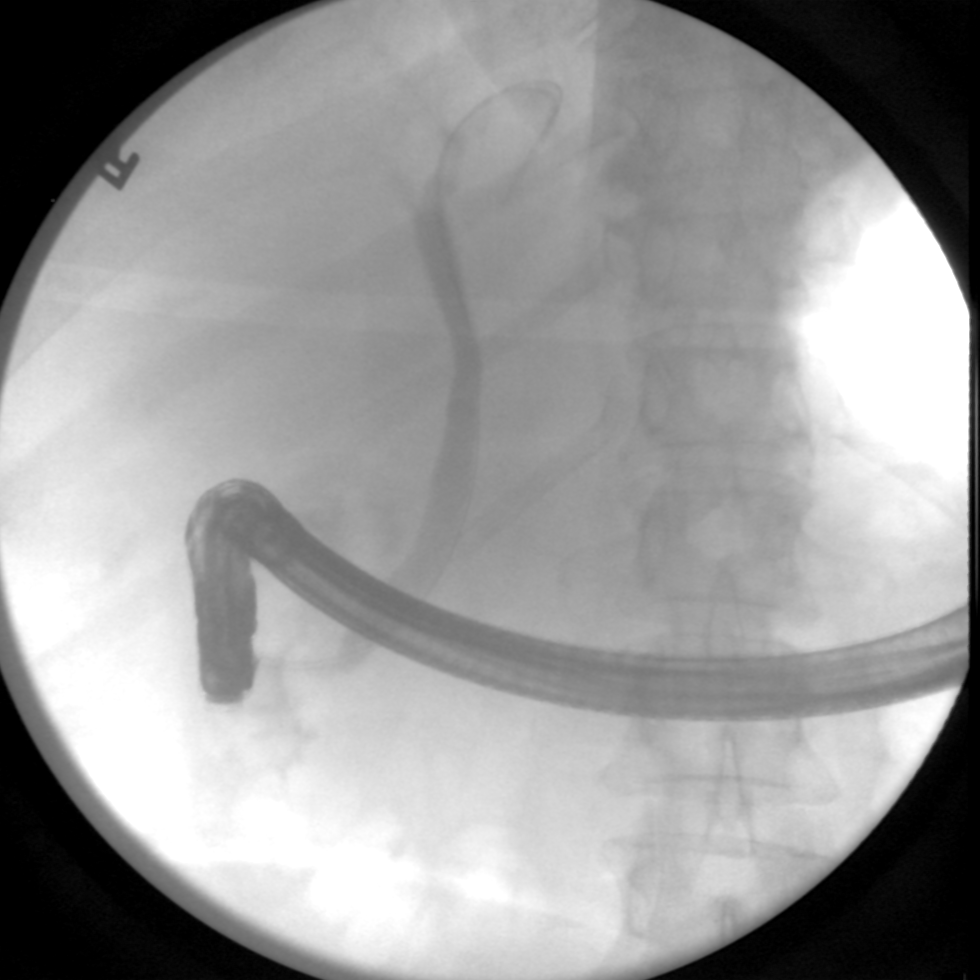
[im 3/3]
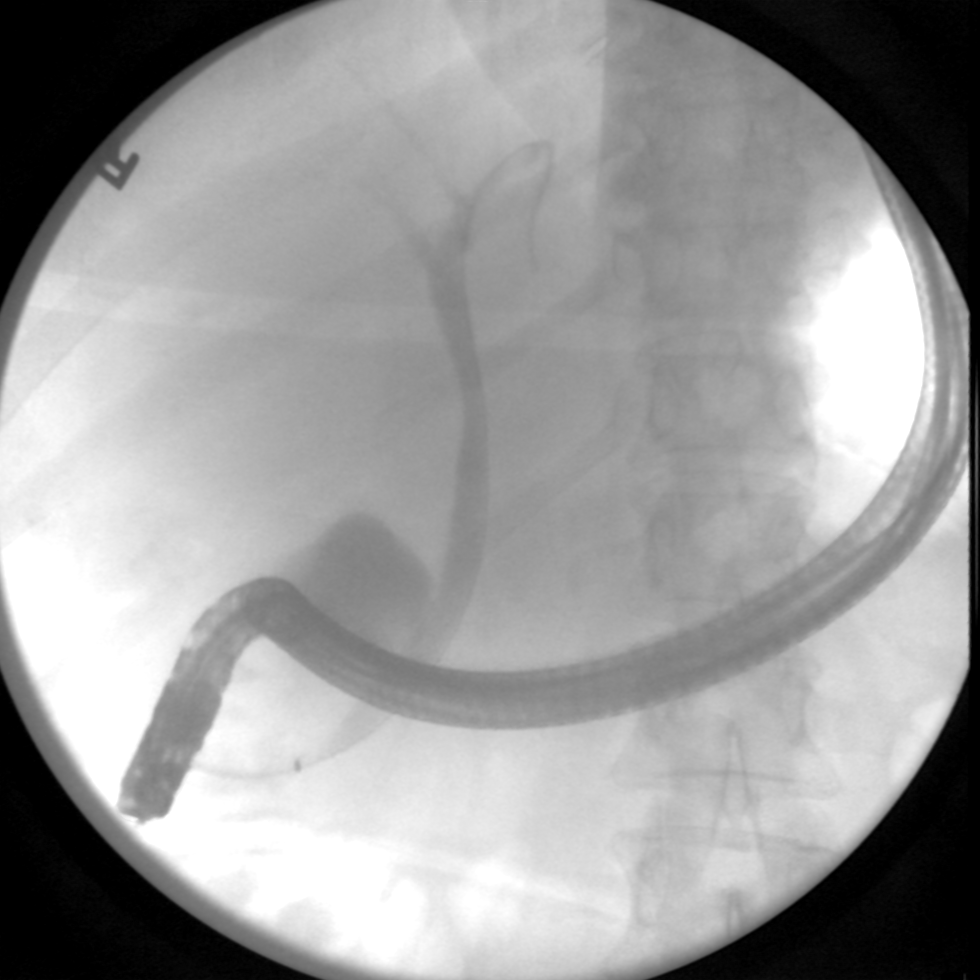

[3 of 3 positions shown; findings below may reference images not displayed]

FINDINGS: Initial image demonstrates endoscope projecting over the upper
abdomen with cannulation of the ampulla and retrograde infusion of
contrast into the extrahepatic biliary system.

Final image demonstrates contrast either within duodenum are
adjacent to the biliary system. A guidewire remains within the
extrahepatic biliary system on the final image.

No contrast visualized to cross the ampulla on these limited images.

No large filling defects identified.
IMPRESSION: Limited images during ERCP demonstrates partial opacification of the
extrahepatic biliary system, with no filling defects of the common
bile duct. Note that contrast is not visualized to cross the
ampulla.

Please refer to the dictated operative report for full details of
intraoperative findings and procedure.

## 2017-12-05 ENCOUNTER — Emergency Department (HOSPITAL_COMMUNITY): Payer: Medicare Other

## 2017-12-05 DIAGNOSIS — R0789 Other chest pain: Secondary | ICD-10-CM | POA: Insufficient documentation

## 2017-12-05 DIAGNOSIS — I10 Essential (primary) hypertension: Secondary | ICD-10-CM | POA: Insufficient documentation

## 2017-12-05 DIAGNOSIS — Z87891 Personal history of nicotine dependence: Secondary | ICD-10-CM | POA: Insufficient documentation

## 2017-12-05 DIAGNOSIS — E119 Type 2 diabetes mellitus without complications: Secondary | ICD-10-CM | POA: Insufficient documentation

## 2017-12-05 DIAGNOSIS — Z79899 Other long term (current) drug therapy: Secondary | ICD-10-CM | POA: Insufficient documentation

## 2017-12-05 DIAGNOSIS — R002 Palpitations: Secondary | ICD-10-CM | POA: Insufficient documentation

## 2017-12-05 LAB — BASIC METABOLIC PANEL
Anion gap: 9 (ref 5–15)
BUN: 14 mg/dL (ref 6–20)
CHLORIDE: 105 mmol/L (ref 101–111)
CO2: 26 mmol/L (ref 22–32)
Calcium: 8.8 mg/dL — ABNORMAL LOW (ref 8.9–10.3)
Creatinine, Ser: 1.07 mg/dL — ABNORMAL HIGH (ref 0.44–1.00)
GFR calc Af Amer: >60 mL/min (ref >60)
GFR calc non Af Amer: 54 mL/min — ABNORMAL LOW (ref >60)
GLUCOSE: 105 mg/dL — AB (ref 65–99)
Potassium: 4.7 mmol/L (ref 3.5–5.1)
SODIUM: 140 mmol/L (ref 135–145)

## 2017-12-05 LAB — CBC
HCT: 41.6 % (ref 36.0–46.0)
Hemoglobin: 13.8 g/dL (ref 12.0–15.0)
MCH: 31.2 pg (ref 26.0–34.0)
MCHC: 33.2 g/dL (ref 30.0–36.0)
MCV: 94.1 fL (ref 78.0–100.0)
PLATELETS: 194 10*3/uL (ref 150–400)
RBC: 4.42 MIL/uL (ref 3.87–5.11)
RDW: 13.2 % (ref 11.5–15.5)
WBC: 9 10*3/uL (ref 4.0–10.5)

## 2017-12-05 LAB — I-STAT TROPONIN, ED: Troponin i, poc: 0 ng/mL (ref 0.00–0.08)

## 2017-12-06 ENCOUNTER — Emergency Department (HOSPITAL_COMMUNITY)
Admission: EM | Admit: 2017-12-06 | Discharge: 2017-12-06 | Disposition: A | Discharge status: Home / Self Care | Payer: Medicare Other | Attending: Emergency Medicine | Admitting: Emergency Medicine

## 2017-12-06 DIAGNOSIS — R002 Palpitations: Secondary | ICD-10-CM

## 2017-12-06 DIAGNOSIS — R079 Chest pain, unspecified: Secondary | ICD-10-CM

## 2017-12-06 LAB — I-STAT TROPONIN, ED: Troponin i, poc: 0 ng/mL (ref 0.00–0.08)

## 2017-12-06 LAB — MAGNESIUM: MAGNESIUM: 2.2 mg/dL (ref 1.7–2.4)

## 2017-12-06 NOTE — ED Provider Notes (Signed)
TIME SEEN: 5:22 AM  CHIEF COMPLAINT: Chest pain, palpitations  HPI: Patient is a 67 year old female with history of hypertension, hyperlipidemia, borderline diabetes, previous tobacco use who quit 14 years ago, fibromyalgia who presents to the emergency department with complaints of intermittently feel like her heart has been racing over the past several days.  States that she thinks it has been due to increased caffeine intake.  States tonight she did have some chest pressure and shortness of breath but this has resolved.  No nausea, vomiting, diaphoresis or dizziness.  Last catheterization was in 2008 with Dr. Jacinto Halim which she reports was normal.  No history of stents.  No history of PE or DVT.  ROS: See HPI Constitutional: no fever  Eyes: no drainage  ENT: no runny nose   Cardiovascular:   chest pain  Resp:  SOB  GI: no vomiting GU: no dysuria Integumentary: no rash  Allergy: no hives  Musculoskeletal: no leg swelling  Neurological: no slurred speech ROS otherwise negative  PAST MEDICAL HISTORY/PAST SURGICAL HISTORY:  Past Medical History:  Diagnosis Date  . Anemia   . Anxiety   . Arthritis   . Asthma   . Diabetes mellitus   . Emphysema of lung (HCC)   . Fibromyalgia   . GERD (gastroesophageal reflux disease)   . Hyperlipidemia   . Hypertension   . Neuromuscular disorder (HCC)   . Polio   . Seizures (HCC)   . Thyroid disease     MEDICATIONS:  Prior to Admission medications   Medication Sig Start Date End Date Taking? Authorizing Provider  albuterol (PROVENTIL,VENTOLIN) 90 MCG/ACT inhaler Inhale 2 puffs into the lungs as needed.      [provider]  aspirin EC 81 MG EC tablet Take 1 tablet (81 mg total) by mouth daily. 11/15/15   Ghimire, Werner Lean, MD  budesonide-formoterol (SYMBICORT) 80-4.5 MCG/ACT inhaler Inhale 2 puffs into the lungs 2 (two) times daily. 06/15/16   Roslynn Amble, MD  docusate sodium (COLACE) 100 MG capsule Take 100 mg by mouth 2 (two)  times daily.    [provider]  ketorolac (TORADOL) 10 MG tablet Take 10 mg by mouth every 6 (six) hours as needed for moderate pain or severe pain.    [provider]  lisinopril (PRINIVIL,ZESTRIL) 20 MG tablet Take 20 mg by mouth daily.    [provider]  montelukast (SINGULAIR) 10 MG tablet Take 1 tablet (10 mg total) by mouth at bedtime. 06/15/16   Roslynn Amble, MD  nitroGLYCERIN (NITROSTAT) 0.4 MG SL tablet Place 0.4 mg under the tongue as directed.    [provider]  oxyCODONE (ROXICODONE) 5 MG immediate release tablet Take 1 tablet (5 mg total) by mouth every 6 (six) hours as needed for severe pain. Patient taking differently: Take 5 mg by mouth 2 (two) times daily.  11/15/15   Ghimire, Werner Lean, MD  phenytoin (DILANTIN) 100 MG ER capsule Take 100 mg by mouth 3 (three) times daily.    [provider]  polyethylene glycol (MIRALAX / GLYCOLAX) packet Take 17 g by mouth daily.    [provider]  Spacer/Aero-Holding Chambers (AEROCHAMBER MV) inhaler Use as instructed 03/02/16   Roslynn Amble, MD    ALLERGIES:  Allergies  Allergen Reactions  . Divalproex Sodium Anaphylaxis  . Codeine Nausea And Vomiting  . Neurontin [Gabapentin] Other (See Comments)    Cannot remember reaction.  . Wellbutrin [Bupropion Hcl] Other (See Comments)  seizure    SOCIAL HISTORY:  Social History   Tobacco Use  . Smoking status: Former Smoker    Packs/day: 1.00    Years: 23.00    Pack years: 23.00    Types: Cigarettes    Start date: 07/16/1971    Last attempt to quit: 04/26/2004    Years since quitting: 13.6  . Smokeless tobacco: Never Used  Substance Use Topics  . Alcohol use: No    FAMILY HISTORY: Family History  Problem Relation Age of Onset  . Heart failure Mother   . Stroke Mother   . Cirrhosis Father   . Arthritis Sister   . Emphysema Brother   . Emphysema Cousin   . Stroke Maternal Grandmother   . Asthma Maternal  Aunt   . Diabetes Maternal Aunt     EXAM: BP (!) 154/66 (BP Location: Left Arm)   Pulse 60   Temp 98.7 F (37.1 C) (Oral)   Resp 14   SpO2 100%  CONSTITUTIONAL: Alert and oriented and responds appropriately to questions. Well-appearing; well-nourished HEAD: Normocephalic EYES: Conjunctivae clear, pupils appear equal, EOMI ENT: normal nose; moist mucous membranes NECK: Supple, no meningismus, no nuchal rigidity, no LAD  CARD: RRR; S1 and S2 appreciated; no murmurs, no clicks, no rubs, no gallops RESP: Normal chest excursion without splinting or tachypnea; breath sounds clear and equal bilaterally; no wheezes, no rhonchi, no rales, no hypoxia or respiratory distress, speaking full sentences ABD/GI: Normal bowel sounds; non-distended; soft, non-tender, no rebound, no guarding, no peritoneal signs, no hepatosplenomegaly BACK:  The back appears normal and is non-tender to palpation, there is no CVA tenderness EXT: Normal ROM in all joints; non-tender to palpation; no edema; normal capillary refill; no cyanosis, no calf tenderness or swelling    SKIN: Normal color for age and race; warm; no rash NEURO: Moves all extremities equally PSYCH: The patient's mood and manner are appropriate. Grooming and personal hygiene are appropriate.  MEDICAL DECISION MAKING: Patient here with chest pain.  I am concerned for possible ACS given her multiple risk factors.  Her EKG shows no ischemic abnormality her first troponin is negative.  She is completely asymptomatic at this time.  I have recommended admission to the hospital but at this time patient refuses.  She does agree to stay for a second troponin.  Her potassium is normal.  Will check magnesium level as well.  She states that she will follow closely with her primary care physician as well as her cardiologist as an outpatient and return if symptoms return.  ED PROGRESS: Patient second troponin is negative.  Magnesium level normal.  Patient still refusing  admission.  At this time I do not feel she needs to sign out AGAINST MEDICAL ADVICE.  She understands the risk of leaving without further work-up.  She agrees to follow-up closely with her outpatient providers and return if she has any return of symptoms.  Doubt PE or dissection.  No sign of pneumonia or fluid overload.  No sign of MI today but she does have risk factors for CAD and have recommended close cardiac follow-up for likely stress test as an outpatient.  At this time, I do not feel there is any life-threatening condition present. I have reviewed and discussed all results (EKG, imaging, lab, urine as appropriate) and exam findings with patient/family. I have reviewed nursing notes and appropriate previous records.  I feel the patient is safe to be discharged home without further emergent workup and can continue workup  as an outpatient as needed. Discussed usual and customary return precautions. Patient/family verbalize understanding and are comfortable with this plan.  Outpatient follow-up has been provided if needed. All questions have been answered.      EKG Interpretation  Date/Time:  Thursday Dec 05 2017 21:27:42 EDT Ventricular Rate:  80 PR Interval:    QRS Duration: 105 QT Interval:  374 QTC Calculation: 432 R Axis:   64 Text Interpretation:  Sinus rhythm EKG WITHIN NORMAL LIMITS Confirmed by Rochele Raring [phone]) on 12/06/2017 5:22:35 AM         Jdyn Parkerson, Layla Maw, DO 12/06/17 0720

## 2018-10-08 ENCOUNTER — Other Ambulatory Visit: Payer: Self-pay

## 2018-10-08 ENCOUNTER — Emergency Department (HOSPITAL_COMMUNITY)
Admission: EM | Admit: 2018-10-08 | Discharge: 2018-10-09 | Disposition: A | Discharge status: Home / Self Care | Payer: Medicare Other | Attending: Emergency Medicine | Admitting: Emergency Medicine

## 2018-10-08 ENCOUNTER — Encounter (HOSPITAL_COMMUNITY): Payer: Self-pay

## 2018-10-08 ENCOUNTER — Emergency Department (HOSPITAL_COMMUNITY): Payer: Medicare Other

## 2018-10-08 DIAGNOSIS — Z87891 Personal history of nicotine dependence: Secondary | ICD-10-CM | POA: Insufficient documentation

## 2018-10-08 DIAGNOSIS — Z79899 Other long term (current) drug therapy: Secondary | ICD-10-CM | POA: Insufficient documentation

## 2018-10-08 DIAGNOSIS — E119 Type 2 diabetes mellitus without complications: Secondary | ICD-10-CM | POA: Insufficient documentation

## 2018-10-08 DIAGNOSIS — J45909 Unspecified asthma, uncomplicated: Secondary | ICD-10-CM | POA: Diagnosis not present

## 2018-10-08 DIAGNOSIS — I10 Essential (primary) hypertension: Secondary | ICD-10-CM | POA: Insufficient documentation

## 2018-10-08 DIAGNOSIS — R0602 Shortness of breath: Secondary | ICD-10-CM

## 2018-10-08 MED ORDER — ALBUTEROL SULFATE (2.5 MG/3ML) 0.083% IN NEBU
5.0000 mg | INHALATION_SOLUTION | Freq: Once | RESPIRATORY_TRACT | Status: DC
  Filled 2018-10-08: qty 6

## 2018-10-08 NOTE — ED Triage Notes (Signed)
Pt reports SOB for the past month. Pt has hx of asthma but no relief from inhalers. Denies fever. resp e.u

## 2018-10-08 NOTE — ED Notes (Addendum)
Pt describes pain in chest. Same as when she came in. Same location and nature. Reports SOB. Pt is walking around the lobby and shows no signs of it. RN notified.

## 2018-10-09 LAB — CBC
HCT: 41.3 % (ref 36.0–46.0)
Hemoglobin: 13.3 g/dL (ref 12.0–15.0)
MCH: 30.1 pg (ref 26.0–34.0)
MCHC: 32.2 g/dL (ref 30.0–36.0)
MCV: 93.4 fL (ref 80.0–100.0)
PLATELETS: 177 10*3/uL (ref 150–400)
RBC: 4.42 MIL/uL (ref 3.87–5.11)
RDW: 13.3 % (ref 11.5–15.5)
WBC: 9.9 10*3/uL (ref 4.0–10.5)
nRBC: 0 % (ref 0.0–0.2)

## 2018-10-09 LAB — BASIC METABOLIC PANEL
ANION GAP: 10 (ref 5–15)
BUN: 10 mg/dL (ref 8–23)
CO2: 25 mmol/L (ref 22–32)
Calcium: 8.9 mg/dL (ref 8.9–10.3)
Chloride: 106 mmol/L (ref 98–111)
Creatinine, Ser: 0.78 mg/dL (ref 0.44–1.00)
GFR calc Af Amer: >60 mL/min (ref >60)
GFR calc non Af Amer: >60 mL/min (ref >60)
GLUCOSE: 91 mg/dL (ref 70–99)
Potassium: 3.8 mmol/L (ref 3.5–5.1)
Sodium: 141 mmol/L (ref 135–145)

## 2018-10-09 LAB — TROPONIN I

## 2018-10-09 MED ORDER — ALBUTEROL SULFATE HFA 108 (90 BASE) MCG/ACT IN AERS: 1.0000 | INHALATION_SPRAY | Freq: Four times a day (QID) | RESPIRATORY_TRACT | 0 refills | Status: DC | PRN

## 2018-10-09 MED ORDER — ALBUTEROL SULFATE HFA 108 (90 BASE) MCG/ACT IN AERS: 1.0000 | INHALATION_SPRAY | Freq: Four times a day (QID) | RESPIRATORY_TRACT | 0 refills | Status: AC | PRN

## 2018-10-09 NOTE — ED Provider Notes (Signed)
[facility] EMERGENCY DEPARTMENT Provider Note   CSN: [id number] Arrival date & time: 10/08/18  1843    History   Chief Complaint Chief Complaint  Patient presents with  . Shortness of Breath    HPI MICHELLIE AUXIER is a 68 y.o. female.     The history is provided by the patient.  Shortness of Breath  Severity:  Moderate Onset quality:  Gradual Timing:  Constant Progression:  Worsening Chronicity:  Recurrent Relieved by:  Nothing Worsened by:  Nothing Associated symptoms: cough   Associated symptoms: no abdominal pain, no fever, no hemoptysis and no vomiting   Associated symptoms comment:  Chest burning Patient with history of anemia, emphysema, fibromyalgia presents with shortness of breath ongoing for the past month.  She reports she is  having burning in her chest for several days as well.  No fevers or vomiting.  She reports cough without hemoptysis. No known Travel.  Denies known history of CAD/VTE  Past Medical History:  Diagnosis Date  . Anemia   . Anxiety   . Arthritis   . Asthma   . Diabetes mellitus   . Emphysema of lung (HCC)   . Fibromyalgia   . GERD (gastroesophageal reflux disease)   . Hyperlipidemia   . Hypertension   . Neuromuscular disorder (HCC)   . Polio   . Seizures (HCC)   . Thyroid disease     Patient Active Problem List   Diagnosis Date Noted  . Asthma 03/02/2016  . 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 Convulsions (HCC)   . Altered mental status   . Hematemesis with nausea 11/14/2015  . Tongue laceration 11/14/2015  . Syncope 11/13/2015  . Chest pain 09/11/2015  . HTN (hypertension) 09/11/2015  . Nausea and vomiting 09/11/2015  . Fibromyalgia 09/11/2015  . T2DM (type 2 diabetes mellitus) (HCC) 09/11/2015  . GERD (gastroesophageal reflux disease) 09/11/2015  . History of tobacco abuse 09/11/2015  . History of repair of hiatal hernia 09/11/2015  . Hyperlipidemia 09/11/2015    Past Surgical History:   Procedure Laterality Date  . ABDOMINAL HYSTERECTOMY  1992   partial  . APPENDECTOMY    . BREAST EXCISIONAL BIOPSY Bilateral 01/19/1998  . CESAREAN SECTION  1984  . CHOLECYSTECTOMY N/A 09/13/2015   Procedure: LAPAROSCOPIC CHOLECYSTECTOMY;  Surgeon: Harriette Bouillon, MD;  Location: [facility] OR;  Service: General;  Laterality: N/A;  . ERCP N/A 09/12/2015   Procedure: ENDOSCOPIC RETROGRADE CHOLANGIOPANCREATOGRAPHY (ERCP);  Surgeon: Dorena Cookey, MD;  Location: [facility] ENDOSCOPY;  Service: Endoscopy;  Laterality: N/A;  . FEMUR DISTAL LOCKING SCREW INSERTION  2004   left foot  . LAPAROSCOPIC NISSEN FUNDOPLICATION  05/15/11   lap nissen and niatal hernia   . OOPHORECTOMY  2004  . TONSILLECTOMY AND ADENOIDECTOMY  1968     OB History   No obstetric history on file.      Home Medications    Prior to Admission medications   Medication Sig Start Date End Date Taking? Authorizing Provider  albuterol (PROVENTIL,VENTOLIN) 90 MCG/ACT inhaler Inhale 2 puffs into the lungs as needed.      [provider]  amoxicillin (AMOXIL) 500 MG capsule Take 1,000 mg by mouth 3 (three) times daily.    [provider]  aspirin EC 81 MG EC tablet Take 1 tablet (81 mg total) by mouth daily. Patient not taking: Reported on 12/06/2017 11/15/15   Maretta Bees, MD  budesonide-formoterol [facility]) 80-4.5 MCG/ACT inhaler Inhale 2 puffs into the lungs 2 (two) times  daily. Patient not taking: Reported on 12/06/2017 06/15/16   Roslynn Amble, MD  docusate sodium (COLACE) 100 MG capsule Take 100 mg by mouth 2 (two) times daily.    [provider]  ketorolac (TORADOL) 10 MG tablet Take 10 mg by mouth every 6 (six) hours as needed for moderate pain or severe pain.    [provider]  lisinopril (PRINIVIL,ZESTRIL) 20 MG tablet Take 20 mg by mouth daily.    [provider]  montelukast (SINGULAIR) 10 MG tablet Take 1 tablet (10 mg total) by mouth at bedtime. Patient not taking: Reported  on 12/06/2017 06/15/16   Roslynn Amble, MD  nitroGLYCERIN (NITROSTAT) 0.4 MG SL tablet Place 0.4 mg under the tongue as directed.    [provider]  oxyCODONE (ROXICODONE) 5 MG immediate release tablet Take 1 tablet (5 mg total) by mouth every 6 (six) hours as needed for severe pain. Patient taking differently: Take 5 mg by mouth every 8 (eight) hours.  11/15/15   Ghimire, Werner Lean, MD  phenytoin (DILANTIN) 100 MG ER capsule Take 100 mg by mouth 3 (three) times daily.    [provider]  polyethylene glycol (MIRALAX / GLYCOLAX) packet Take 17 g by mouth daily.    [provider]  Propylene Glycol (SYSTANE BALANCE) 0.6 % SOLN Apply 1 drop to eye daily.    [provider]  Spacer/Aero-Holding Chambers (AEROCHAMBER MV) inhaler Use as instructed 03/02/16   Roslynn Amble, MD  tobramycin-dexamethasone [facility]) ophthalmic ointment Place 1 application into both eyes daily as needed (dry eye).    [provider]    Family History Family History  Problem Relation Age of Onset  . Heart failure Mother   . Stroke Mother   . Cirrhosis Father   . Arthritis Sister   . Emphysema Brother   . Emphysema Cousin   . Stroke Maternal Grandmother   . Asthma Maternal Aunt   . Diabetes Maternal Aunt     Social History Social History   Tobacco Use  . Smoking status: Former Smoker    Packs/day: 1.00    Years: 23.00    Pack years: 23.00    Types: Cigarettes    Start date: 07/16/1971    Last attempt to quit: 04/26/2004    Years since quitting: 14.4  . Smokeless tobacco: Never Used  Substance Use Topics  . Alcohol use: No  . Drug use: No     Allergies   Divalproex sodium; Codeine; Neurontin [gabapentin]; and Wellbutrin [bupropion hcl]   Review of Systems Review of Systems  Constitutional: Negative for fever.  Respiratory: Positive for cough and shortness of breath. Negative for hemoptysis.   Cardiovascular: Negative for leg swelling.        Chest burning   Gastrointestinal: Negative for abdominal pain and vomiting.  All other systems reviewed and are negative.    Physical Exam Updated Vital Signs BP 128/69   Pulse 74   Temp 98.1 F (36.7 C) (Oral)   Resp 18   Ht 1.702 m (5\' 7" )   Wt 99.8 kg   SpO2 94%   BMI 34.46 kg/m   Physical Exam CONSTITUTIONAL: Well developed/well nourished HEAD: Normocephalic/atraumatic EYES: EOMI/PERRL ENMT: Mucous membranes moist NECK: supple no meningeal signs SPINE/BACK:entire spine nontender CV: S1/S2 noted, no murmurs/rubs/gallops noted LUNGS: Lungs are clear to auscultation bilaterally, no apparent distress ABDOMEN: soft, nontender, no rebound or guarding, bowel sounds noted throughout abdomen GU:no cva tenderness NEURO: Pt is awake/alert/appropriate, moves  all extremitiesx4.  No facial droop.   EXTREMITIES: pulses normal/equal, full ROM, no calf tenderness or edema SKIN: warm, color normal PSYCH: anxious  ED Treatments / Results  Labs (all labs ordered are listed, but only abnormal results are displayed) Labs Reviewed  BASIC METABOLIC PANEL  CBC  TROPONIN I    EKG ED ECG REPORT   Date: 10/08/2018 1855  Rate: 72  Rhythm: normal sinus rhythm  QRS Axis: normal  Intervals: normal  ST/T Wave abnormalities: normal  Conduction Disutrbances:none   I have personally reviewed the EKG tracing and agree with the computerized printout as noted.  Radiology Dg Chest 2 View  Result Date: 10/08/2018 CLINICAL DATA:  Shortness of breath EXAM: CHEST - 2 VIEW COMPARISON:  12/05/2017 FINDINGS: The heart size and mediastinal contours are within normal limits. Both lungs are clear. The visualized skeletal structures are unremarkable. IMPRESSION: No active cardiopulmonary disease. Electronically Signed   By: Deatra Robinson M.D.   On: 10/08/2018 19:48    Procedures Procedures    Medications Ordered in ED Medications  albuterol (PROVENTIL) (2.5 MG/3ML) 0.083% nebulizer solution 5  mg (has no administration in time range)     Initial Impression / Assessment and Plan / ED Course  I have reviewed the triage vital signs and the nursing notes.  Pertinent labs  results that were available during my care of the patient were reviewed by me and considered in my medical decision making (see chart for details).        Patient presents for ongoing shortness of breath for over a month.  She has been monitored in the ER for several hours.  Vitals remained stable.  She is in no acute distress.  Does not appear to be an acute issue.  She just reports at times she cannot get air into her lungs.  She want to make sure her heart was okay, initial EKG and troponin are negative She will be referred back to pulmonology  She requests refill of her Ventolin I have low suspicion for ACS/PE Final Clinical Impressions(s) / ED Diagnoses   Final diagnoses:  Shortness of breath    ED Discharge Orders    None       Zadie Rhine, MD 10/09/18 [phone]

## 2018-10-09 NOTE — ED Notes (Signed)
Patient verbalizes understanding of discharge instructions. Opportunity for questioning and answers were provided. Armband removed by staff, pt discharged from ED ambulatory.   

## 2018-10-10 ENCOUNTER — Ambulatory Visit (INDEPENDENT_AMBULATORY_CARE_PROVIDER_SITE_OTHER): Payer: Medicare Other | Admitting: Nurse Practitioner

## 2018-10-10 ENCOUNTER — Encounter: Payer: Self-pay | Admitting: Nurse Practitioner

## 2018-10-10 ENCOUNTER — Other Ambulatory Visit: Payer: Self-pay

## 2018-10-10 VITALS — BP 132/76 | HR 74 | Wt 234.4 lb

## 2018-10-10 DIAGNOSIS — J45901 Unspecified asthma with (acute) exacerbation: Secondary | ICD-10-CM

## 2018-10-10 LAB — NITRIC OXIDE: Nitric Oxide: 17

## 2018-10-10 LAB — CBC WITH DIFFERENTIAL/PLATELET
Basophils Absolute: 0.1 10*3/uL (ref 0.0–0.1)
Basophils Relative: 0.6 % (ref 0.0–3.0)
Eosinophils Absolute: 0.2 10*3/uL (ref 0.0–0.7)
Eosinophils Relative: 2 % (ref 0.0–5.0)
HEMATOCRIT: 41.4 % (ref 36.0–46.0)
Hemoglobin: 14 g/dL (ref 12.0–15.0)
Lymphocytes Relative: 33.7 % (ref 12.0–46.0)
Lymphs Abs: 3.2 10*3/uL (ref 0.7–4.0)
MCHC: 33.9 g/dL (ref 30.0–36.0)
MCV: 91.5 fl (ref 78.0–100.0)
Monocytes Absolute: 0.6 10*3/uL (ref 0.1–1.0)
Monocytes Relative: 5.9 % (ref 3.0–12.0)
Neutro Abs: 5.5 10*3/uL (ref 1.4–7.7)
Neutrophils Relative %: 57.8 % (ref 43.0–77.0)
PLATELETS: 195 10*3/uL (ref 150.0–400.0)
RBC: 4.53 Mil/uL (ref 3.87–5.11)
RDW: 14 % (ref 11.5–15.5)
WBC: 9.5 10*3/uL (ref 4.0–10.5)

## 2018-10-10 NOTE — Addendum Note (Signed)
Addended by: Ander Slade on: 10/10/2018 10:47 AM   Modules accepted: Orders

## 2018-10-10 NOTE — Addendum Note (Signed)
Addended by: Ander Slade on: 10/10/2018 10:35 AM   Modules accepted: Orders

## 2018-10-10 NOTE — Addendum Note (Signed)
Addended by: Demetrio Lapping E on: 10/10/2018 10:45 AM   Modules accepted: Orders

## 2018-10-10 NOTE — Progress Notes (Signed)
@Patient  ID: Tamara Chambers, female    DOB: 11-17-1950, 68 y.o.   MRN: [id number]  Chief Complaint  Patient presents with  . Hospitalization Follow-up    Shortness of breath. Inhaler given at hospital has been working.    Referring provider: Kaleen Mask, *  HPI 68 year old female smoker with a history of asthma who was formally followed by Dr. Jamison Neighbor.   Tests: CXR 10/08/18 - No active cardiopulmonary disease. PFT Results Latest Ref Rng & Units 06/15/2016 01/26/2016  FVC-Pre L 3.58 3.32  FVC-Predicted Pre % 99 91  FVC-Post L 3.58 3.74  FVC-Predicted Post % 99 103  Pre FEV1/FVC % % 87 38  Post FEV1/FCV % % 86 89  FEV1-Pre L 3.12 1.26  FEV1-Predicted Pre % 112 45  FEV1-Post L 3.09 3.31  DLCO UNC% % - 77  DLCO COR %Predicted % - 93  TLC L - 5.37  TLC % Predicted % - 97  RV % Predicted % - 83     OV 10/10/18 - Follow up - hospital Patient presents today for a hospital follow-up.  She was seen in the hospital on 10/08/2018 for shortness of breath/asthma exacerbation.  Her chest x-ray and EKG were normal.  Patient has not been on any inhalers and was started on albuterol as needed.  Not been seen here since 2017 by Dr. Jamison Neighbor.  She has been doing well until recently but states that she does have excessive mold in her house since the weather has been damp.  She is going to check and to get this cleaned.  Patient's FENO the office today was 17.  She states that she has been doing well since discharge from the hospital. Denies f/c/s, n/v/d, hemoptysis, PND, leg swelling.    Allergies  Allergen Reactions  . Divalproex Sodium Anaphylaxis  . Duloxetine     Other reaction(s): Delusions (intolerance)  . Codeine Nausea And Vomiting  . Neurontin [Gabapentin] Other (See Comments)    Cannot remember reaction.  . Wellbutrin [Bupropion Hcl] Other (See Comments)    seizure     There is no immunization history on file for this patient.  Past Medical History:  Diagnosis Date   . Anemia   . Anxiety   . Arthritis   . Asthma   . Diabetes mellitus   . Emphysema of lung (HCC)   . Fibromyalgia   . GERD (gastroesophageal reflux disease)   . Hyperlipidemia   . Hypertension   . Neuromuscular disorder (HCC)   . Polio   . Seizures (HCC)   . Thyroid disease     Tobacco History: Social History   Tobacco Use  Smoking Status Former Smoker  . Packs/day: 1.00  . Years: 23.00  . Pack years: 23.00  . Types: Cigarettes  . Start date: 07/16/1971  . Last attempt to quit: 04/26/2004  . Years since quitting: 14.4  Smokeless Tobacco Never Used   Counseling given: Yes   Outpatient Encounter Medications as of 10/10/2018  Medication Sig  . albuterol (PROVENTIL HFA;VENTOLIN HFA) 108 (90 Base) MCG/ACT inhaler Inhale 1-2 puffs into the lungs every 6 (six) hours as needed for wheezing or shortness of breath.  . docusate sodium (COLACE) 100 MG capsule Take 100 mg by mouth 2 (two) times daily.  Marland Kitchen lisinopril (PRINIVIL,ZESTRIL) 20 MG tablet Take 20 mg by mouth daily.  . nitroGLYCERIN (NITROSTAT) 0.4 MG SL tablet Place 0.4 mg under the tongue as directed.  Marland Kitchen oxyCODONE (ROXICODONE) 5 MG immediate release tablet  Take 1 tablet (5 mg total) by mouth every 6 (six) hours as needed for severe pain. (Patient taking differently: Take 5 mg by mouth every 8 (eight) hours. )  . phenytoin (DILANTIN) 100 MG ER capsule Take 100 mg by mouth 3 (three) times daily.  . polyethylene glycol (MIRALAX / GLYCOLAX) packet Take 17 g by mouth daily.  . [DISCONTINUED] aspirin EC 81 MG EC tablet Take 1 tablet (81 mg total) by mouth daily. (Patient not taking: Reported on 12/06/2017)  . [DISCONTINUED] budesonide-formoterol (SYMBICORT) 80-4.5 MCG/ACT inhaler Inhale 2 puffs into the lungs 2 (two) times daily. (Patient not taking: Reported on 12/06/2017)  . [DISCONTINUED] ketorolac (TORADOL) 10 MG tablet Take 10 mg by mouth every 6 (six) hours as needed for moderate pain or severe pain.  . [DISCONTINUED]  montelukast (SINGULAIR) 10 MG tablet Take 1 tablet (10 mg total) by mouth at bedtime. (Patient not taking: Reported on 12/06/2017)  . [DISCONTINUED] Propylene Glycol (SYSTANE BALANCE) 0.6 % SOLN Apply 1 drop to eye daily.  . [DISCONTINUED] Spacer/Aero-Holding Chambers (AEROCHAMBER MV) inhaler Use as instructed (Patient not taking: Reported on 10/10/2018)  . [DISCONTINUED] tobramycin-dexamethasone (TOBRADEX) ophthalmic ointment Place 1 application into both eyes daily as needed (dry eye).   No facility-administered encounter medications on file as of 10/10/2018.      Review of Systems  Review of Systems  Constitutional: Negative.  Negative for chills and fever.  HENT: Negative.   Respiratory: Negative for cough, shortness of breath and wheezing.   Cardiovascular: Negative.  Negative for chest pain, palpitations and leg swelling.  Gastrointestinal: Negative.   Allergic/Immunologic: Negative.   Neurological: Negative.   Psychiatric/Behavioral: Negative.        Physical Exam  BP 132/76 (BP Location: Right Arm, Patient Position: Sitting, Cuff Size: Large)   Pulse 74   Wt 234 lb 6.4 oz (106.3 kg)   SpO2 93%   BMI 36.71 kg/m   Wt Readings from Last 5 Encounters:  10/10/18 234 lb 6.4 oz (106.3 kg)  10/09/18 220 lb (99.8 kg)  06/15/16 229 lb (103.9 kg)  03/02/16 219 lb (99.3 kg)  01/05/16 214 lb 3.2 oz (97.2 kg)     Physical Exam Vitals signs and nursing note reviewed.  Constitutional:      General: She is not in acute distress.    Appearance: She is well-developed.  Cardiovascular:     Rate and Rhythm: Normal rate and regular rhythm.  Pulmonary:     Effort: Pulmonary effort is normal. No respiratory distress.     Breath sounds: Normal breath sounds. No wheezing or rhonchi.  Musculoskeletal:        General: No swelling.  Neurological:     Mental Status: She is alert and oriented to person, place, and time.      Imaging: Dg Chest 2 View  Result Date: 10/08/2018  CLINICAL DATA:  Shortness of breath EXAM: CHEST - 2 VIEW COMPARISON:  12/05/2017 FINDINGS: The heart size and mediastinal contours are within normal limits. Both lungs are clear. The visualized skeletal structures are unremarkable. IMPRESSION: No active cardiopulmonary disease. Electronically Signed   By: Deatra RobinsonKevin  Herman M.D.   On: 10/08/2018 19:48     Assessment & Plan:   Asthma Asthma exacerbation has resolved.  Patient is doing well in office today.  We will have her return to establish care with PFT with Dr. Isaiah SergeMannam.  Patient Instructions  Will order CBC/Diff, IgE and call with results FENO in office today was 17 Continue albuterol as  needed   Follow up: Follow up to establish care with Dr. Isaiah Serge  at his first available with PFT before appointment on same day as appointment       Ivonne Andrew, NP 10/10/2018

## 2018-10-10 NOTE — Assessment & Plan Note (Signed)
Asthma exacerbation has resolved.  Patient is doing well in office today.  We will have her return to establish care with PFT with Dr. Isaiah Serge.  Patient Instructions  Will order CBC/Diff, IgE and call with results FENO in office today was 17 Continue albuterol as needed   Follow up: Follow up to establish care with Dr. Isaiah Serge  at his first available with PFT before appointment on same day as appointment

## 2018-10-10 NOTE — Addendum Note (Signed)
Addended by: Demetrio Lapping E on: 10/10/2018 10:39 AM   Modules accepted: Orders

## 2018-10-10 NOTE — Patient Instructions (Addendum)
Will order CBC/Diff, IgE and call with results FENO in office today was 17 Continue albuterol as needed   Follow up: Follow up to establish care with Dr. Isaiah Serge  at his first available with PFT before appointment on same day as appointment

## 2018-10-10 NOTE — Addendum Note (Signed)
Addended by: Ander Slade on: 10/10/2018 10:40 AM   Modules accepted: Orders

## 2018-10-13 LAB — IGE: IgE (Immunoglobulin E), Serum: 625 kU/L — ABNORMAL HIGH (ref <=114)

## 2018-10-15 ENCOUNTER — Telehealth: Payer: Self-pay | Admitting: Nurse Practitioner

## 2018-10-15 NOTE — Telephone Encounter (Signed)
Notes recorded by Ivonne Andrew, NP on 10/13/2018 at 5:04 PM EDT Please call to let patient know that her IgE level was elevated. She is scheduled for an appointment with Dr. Isaiah Serge for PFT and follow-up in a couple weeks. This can be addressed at that appointment. ____________________________________________________  Jeanene Erb and spoke with patient she is aware of results above. Patient verbalized understanding. No further questions or concerns. Will close encounter.

## 2018-10-27 ENCOUNTER — Ambulatory Visit: Payer: Medicare Other | Admitting: Pulmonary Disease

## 2018-11-28 DIAGNOSIS — G40209 Localization-related (focal) (partial) symptomatic epilepsy and epileptic syndromes with complex partial seizures, not intractable, without status epilepticus: Secondary | ICD-10-CM | POA: Insufficient documentation

## 2019-01-27 ENCOUNTER — Ambulatory Visit: Payer: Medicare Other | Admitting: Nurse Practitioner

## 2019-02-10 ENCOUNTER — Other Ambulatory Visit: Payer: Self-pay | Admitting: Family Medicine

## 2019-02-10 DIAGNOSIS — M542 Cervicalgia: Secondary | ICD-10-CM

## 2019-02-14 ENCOUNTER — Other Ambulatory Visit: Payer: Self-pay | Admitting: Family Medicine

## 2019-02-14 DIAGNOSIS — M542 Cervicalgia: Secondary | ICD-10-CM

## 2019-02-27 ENCOUNTER — Other Ambulatory Visit: Payer: Medicare Other

## 2019-03-11 ENCOUNTER — Ambulatory Visit
Admission: RE | Admit: 2019-03-11 | Discharge: 2019-03-11 | Disposition: A | Discharge status: Home / Self Care | Payer: Medicare Other | Source: Ambulatory Visit | Attending: Family Medicine | Admitting: Family Medicine

## 2019-03-11 ENCOUNTER — Other Ambulatory Visit: Payer: Self-pay

## 2019-03-11 DIAGNOSIS — M542 Cervicalgia: Secondary | ICD-10-CM

## 2020-12-01 DIAGNOSIS — E039 Hypothyroidism, unspecified: Secondary | ICD-10-CM | POA: Insufficient documentation

## 2021-01-11 ENCOUNTER — Telehealth: Payer: Self-pay

## 2021-01-11 NOTE — Telephone Encounter (Signed)
NOTES ON FILE FROM Windle Guard, MD WITH [facility]. PHONE: [phone] REFERRAL SENT TO SCHEDULING.

## 2021-01-18 NOTE — Progress Notes (Deleted)
Cardiology Office Note   Date:  01/18/2021   ID:  Tamara Chambers, Tamara Chambers 16-Dec-1950, MRN [id number]  PCP:  Kaleen Mask, MD  Cardiologist:   None Referring:  Kaleen Mask, MD  No chief complaint on file.     History of Present Illness: Tamara Chambers is a 70 y.o. female who is referred by *** for evaluation of presyncope.       Past Medical History:  Diagnosis Date   Anemia    Anxiety    Arthritis    Asthma    Diabetes mellitus    Emphysema of lung (HCC)    Fibromyalgia    GERD (gastroesophageal reflux disease)    Hyperlipidemia    Hypertension    Neuromuscular disorder (HCC)    Polio    Seizures (HCC)    Thyroid disease     Past Surgical History:  Procedure Laterality Date   ABDOMINAL HYSTERECTOMY  1992   partial   APPENDECTOMY     BREAST EXCISIONAL BIOPSY Bilateral 01/19/1998   CESAREAN SECTION  1984   CHOLECYSTECTOMY N/A 09/13/2015   Procedure: LAPAROSCOPIC CHOLECYSTECTOMY;  Surgeon: Harriette Bouillon, MD;  Location: [facility] OR;  Service: General;  Laterality: N/A;   ERCP N/A 09/12/2015   Procedure: ENDOSCOPIC RETROGRADE CHOLANGIOPANCREATOGRAPHY (ERCP);  Surgeon: Dorena Cookey, MD;  Location: [facility] ENDOSCOPY;  Service: Endoscopy;  Laterality: N/A;   FEMUR DISTAL LOCKING SCREW INSERTION  2004   left foot   LAPAROSCOPIC NISSEN FUNDOPLICATION  05/15/11   lap nissen and niatal hernia    OOPHORECTOMY  2004   TONSILLECTOMY AND ADENOIDECTOMY  1968     Current Outpatient Medications  Medication Sig Dispense Refill   albuterol (PROVENTIL HFA;VENTOLIN HFA) 108 (90 Base) MCG/ACT inhaler Inhale 1-2 puffs into the lungs every 6 (six) hours as needed for wheezing or shortness of breath. 1 Inhaler 0   docusate sodium (COLACE) 100 MG capsule Take 100 mg by mouth 2 (two) times daily.     lisinopril (PRINIVIL,ZESTRIL) 20 MG tablet Take 20 mg by mouth daily.     nitroGLYCERIN (NITROSTAT) 0.4 MG SL tablet Place 0.4 mg under the tongue as directed.     oxyCODONE  (ROXICODONE) 5 MG immediate release tablet Take 1 tablet (5 mg total) by mouth every 6 (six) hours as needed for severe pain. (Patient taking differently: Take 5 mg by mouth every 8 (eight) hours. ) 20 tablet 0   phenytoin (DILANTIN) 100 MG ER capsule Take 100 mg by mouth 3 (three) times daily.     polyethylene glycol (MIRALAX / GLYCOLAX) packet Take 17 g by mouth daily.     No current facility-administered medications for this visit.    Allergies:   Divalproex sodium, Duloxetine, Codeine, Neurontin [gabapentin], and Wellbutrin [bupropion hcl]    Social History:  The patient  reports that she quit smoking about 16 years ago. Her smoking use included cigarettes. She started smoking about 49 years ago. She has a 23.00 pack-year smoking history. She has never used smokeless tobacco. She reports that she does not drink alcohol and does not use drugs.   Family History:  The patient's ***family history includes Arthritis in her sister; Asthma in her maternal aunt; Cirrhosis in her father; Diabetes in her maternal aunt; Emphysema in her brother and cousin; Heart failure in her mother; Stroke in her maternal grandmother and mother.    ROS:  Please see the history of present illness.   Otherwise, review of systems are positive for {  NONE DEFAULTED:18576}.   All other systems are reviewed and negative.    PHYSICAL EXAM: VS:  There were no vitals taken for this visit. , BMI There is no height or weight on file to calculate BMI. GENERAL:  Well appearing HEENT:  Pupils equal round and reactive, fundi not visualized, oral mucosa unremarkable NECK:  No jugular venous distention, waveform within normal limits, carotid upstroke brisk and symmetric, no bruits, no thyromegaly LYMPHATICS:  No cervical, inguinal adenopathy LUNGS:  Clear to auscultation bilaterally BACK:  No CVA tenderness CHEST:  Unremarkable HEART:  PMI not displaced or sustained,S1 and S2 within normal limits, no S3, no S4, no clicks, no  rubs, *** murmurs ABD:  Flat, positive bowel sounds normal in frequency in pitch, no bruits, no rebound, no guarding, no midline pulsatile mass, no hepatomegaly, no splenomegaly EXT:  2 plus pulses throughout, no edema, no cyanosis no clubbing SKIN:  No rashes no nodules NEURO:  Cranial nerves II through XII grossly intact, motor grossly intact throughout PSYCH:  Cognitively intact, oriented to person place and time    EKG:  EKG {ACTION; IS/IS [id number]} ordered today. The ekg ordered today demonstrates ***   Recent Labs: No results found for requested labs within last 8760 hours.    Lipid Panel    Component Value Date/Time   CHOL 165 11/15/2015 0640   TRIG 165 (H) 11/15/2015 0640   HDL 35 (L) 11/15/2015 0640   CHOLHDL 4.7 11/15/2015 0640   VLDL 33 11/15/2015 0640   LDLCALC 97 11/15/2015 0640      Wt Readings from Last 3 Encounters:  10/10/18 234 lb 6.4 oz (106.3 kg)  10/09/18 220 lb (99.8 kg)  06/15/16 229 lb (103.9 kg)      Other studies Reviewed: Additional studies/ records that were reviewed today include: ***. Review of the above records demonstrates:  Please see elsewhere in the note.  ***   ASSESSMENT AND PLAN:  DIZZINESS:  ***   Current medicines are reviewed at length with the patient today.  The patient {ACTIONS; HAS/DOES NOT HAVE:19233} concerns regarding medicines.  The following changes have been made:  {PLAN; NO CHANGE:13088:s}  Labs/ tests ordered today include: *** No orders of the defined types were placed in this encounter.    Disposition:   FU with ***    Signed, Rollene Rotunda, MD  01/18/2021 9:27 PM    [facility]

## 2021-01-19 ENCOUNTER — Ambulatory Visit: Payer: Medicare Other | Admitting: Cardiology

## 2021-03-22 ENCOUNTER — Other Ambulatory Visit: Payer: Self-pay

## 2021-03-22 ENCOUNTER — Ambulatory Visit (INDEPENDENT_AMBULATORY_CARE_PROVIDER_SITE_OTHER): Payer: Medicare Other | Admitting: Cardiology

## 2021-03-22 ENCOUNTER — Encounter: Payer: Self-pay | Admitting: Cardiology

## 2021-03-22 VITALS — BP 122/76 | HR 70 | Ht 67.0 in | Wt 221.2 lb

## 2021-03-22 DIAGNOSIS — Z01812 Encounter for preprocedural laboratory examination: Secondary | ICD-10-CM

## 2021-03-22 DIAGNOSIS — R072 Precordial pain: Secondary | ICD-10-CM | POA: Diagnosis not present

## 2021-03-22 MED ORDER — METOPROLOL TARTRATE 100 MG PO TABS: ORAL_TABLET | ORAL | 0 refills | Status: DC

## 2021-03-22 NOTE — Progress Notes (Signed)
Cardiology Office Note   Date:  03/22/2021   ID:  Mairlyn, Tegtmeyer 1951-04-30, MRN [id number]  PCP:  Kaleen Mask, MD  Cardiologist:   None Referring:  Kaleen Mask, MD  Chief Complaint  Patient presents with   Chest Pain       History of Present Illness: Tamara Chambers is a 70 y.o. female who presents for evaluation of chest pain.  She has had no prior cardiac diagnosis although there was a catheterization that I see from 2008 with normal coronaries.  She had syncope in 2017.  I looked at these results and she had a negative work-up including an echocardiogram with an EF of 55% and no significant wall motion abnormalities.  On May 9 she said that she was frightened by her brother who was playing a trick on her.  She had severe chest pain.  She had to take nitroglycerin.  She subsequently called EMS but they found no acute abnormalities and she declined transportation.  She did have some chest discomfort in her mid central chest about 3 weeks ago radiating to her right arm and is so referred for evaluation of this.  She has chronic shortness of breath.  She is very limited by fibromyalgia and can barely get up to do things.  She says she takes a nitroglycerin that she has residual from hospitalization related to hypertensive urgency.  She might do this about once or twice a year.  She says her shortness of breath is at baseline.  There is no new PND or orthopnea.  She does have palpitations but she has not had any new presyncope or syncope.  She has worn monitors over the years without clear etiology.   Past Medical History:  Diagnosis Date   Anemia    Anxiety    Arthritis    Asthma    Diabetes mellitus    Emphysema of lung (HCC)    Fibromyalgia    GERD (gastroesophageal reflux disease)    Hyperlipidemia    Hypertension    Neuromuscular disorder (HCC)    Polio    Seizures (HCC)    Thyroid disease     Past Surgical History:  Procedure Laterality  Date   ABDOMINAL HYSTERECTOMY  1992   partial   APPENDECTOMY     BREAST EXCISIONAL BIOPSY Bilateral 01/19/1998   CESAREAN SECTION  1984   CHOLECYSTECTOMY N/A 09/13/2015   Procedure: LAPAROSCOPIC CHOLECYSTECTOMY;  Surgeon: Harriette Bouillon, MD;  Location: [facility] OR;  Service: General;  Laterality: N/A;   ERCP N/A 09/12/2015   Procedure: ENDOSCOPIC RETROGRADE CHOLANGIOPANCREATOGRAPHY (ERCP);  Surgeon: Dorena Cookey, MD;  Location: [facility] ENDOSCOPY;  Service: Endoscopy;  Laterality: N/A;   FEMUR DISTAL LOCKING SCREW INSERTION  2004   left foot   LAPAROSCOPIC NISSEN FUNDOPLICATION  05/15/11   lap nissen and niatal hernia    OOPHORECTOMY  2004   TONSILLECTOMY AND ADENOIDECTOMY  1968     Current Outpatient Medications  Medication Sig Dispense Refill   albuterol (PROVENTIL HFA;VENTOLIN HFA) 108 (90 Base) MCG/ACT inhaler Inhale 1-2 puffs into the lungs every 6 (six) hours as needed for wheezing or shortness of breath. 1 Inhaler 0   lisinopril (PRINIVIL,ZESTRIL) 20 MG tablet Take 20 mg by mouth daily.     metoprolol tartrate (LOPRESSOR) 100 MG tablet TAKE 1 TABLET 2 HR PRIOR TO CARDIAC PROCEDURE 1 tablet 0   nitroGLYCERIN (NITROSTAT) 0.4 MG SL tablet Place 0.4 mg under the tongue as directed.  oxyCODONE (ROXICODONE) 5 MG immediate release tablet Take 1 tablet (5 mg total) by mouth every 6 (six) hours as needed for severe pain. (Patient taking differently: Take 5 mg by mouth every 8 (eight) hours.) 20 tablet 0   phenytoin (DILANTIN) 100 MG ER capsule Take 100 mg by mouth 3 (three) times daily.     polyethylene glycol (MIRALAX / GLYCOLAX) packet Take 17 g by mouth daily.     docusate sodium (COLACE) 100 MG capsule Take 100 mg by mouth 2 (two) times daily. (Patient not taking: Reported on 03/22/2021)     No current facility-administered medications for this visit.    Allergies:   Aripiprazole, Divalproex sodium, Tramadol, Duloxetine, Codeine, Neurontin [gabapentin], and Wellbutrin [bupropion hcl]     Social History:  The patient  reports that she quit smoking about 16 years ago. Her smoking use included cigarettes. She started smoking about 49 years ago. She has a 23.00 pack-year smoking history. She has never used smokeless tobacco. She reports that she does not drink alcohol and does not use drugs.   Family History:  The patient's family history includes Arthritis in her sister; Asthma in her maternal aunt; Cirrhosis in her father; Coronary artery disease (age of onset: 52) in her brother; Diabetes in her maternal aunt; Emphysema in her brother and cousin; Heart failure in her mother; Stroke in her maternal grandmother and mother.    ROS:  Please see the history of present illness.   Otherwise, review of systems are positive for hematuria.   All other systems are reviewed and negative.    PHYSICAL EXAM: VS:  BP 122/76   Pulse 70   Ht 5\' 7"  (1.702 m)   Wt 221 lb 3.2 oz (100.3 kg)   SpO2 96%   BMI 34.64 kg/m  , BMI Body mass index is 34.64 kg/m. GENERAL:  Well appearing HEENT:  Pupils equal round and reactive, fundi not visualized, oral mucosa unremarkable NECK:  No jugular venous distention, waveform within normal limits, carotid upstroke brisk and symmetric, no bruits, no thyromegaly LYMPHATICS:  No cervical, inguinal adenopathy LUNGS:  Clear to auscultation bilaterally BACK:  No CVA tenderness CHEST:  Unremarkable HEART:  PMI not displaced or sustained,S1 and S2 within normal limits, no S3, no S4, no clicks, no rubs, no murmurs ABD:  Flat, positive bowel sounds normal in frequency in pitch, no bruits, no rebound, no guarding, no midline pulsatile mass, no hepatomegaly, no splenomegaly EXT:  2 plus pulses throughout, no edema, no cyanosis no clubbing SKIN:  No rashes no nodules NEURO:  Cranial nerves II through XII grossly intact, motor grossly intact throughout PSYCH:  Cognitively intact, oriented to person place and time    EKG:  EKG is ordered today. The ekg ordered  today demonstrates sinus rhythm, rate 78, axis within normal limits, intervals within normal limits no acute ST-T wave changes.   Recent Labs: No results found for requested labs within last 8760 hours.    Lipid Panel    Component Value Date/Time   CHOL 165 11/15/2015 0640   TRIG 165 (H) 11/15/2015 0640   HDL 35 (L) 11/15/2015 0640   CHOLHDL 4.7 11/15/2015 0640   VLDL 33 11/15/2015 0640   LDLCALC 97 11/15/2015 0640      Wt Readings from Last 3 Encounters:  03/22/21 221 lb 3.2 oz (100.3 kg)  10/10/18 234 lb 6.4 oz (106.3 kg)  10/09/18 220 lb (99.8 kg)      Other studies Reviewed: Additional studies/ records  that were reviewed today include: None. Review of the above records demonstrates:  Please see elsewhere in the note.     ASSESSMENT AND PLAN:  CHEST PAIN: Her chest pain has some nonanginal features but she does have significant cardiovascular risk factors.  I think the pretest probability of obstructive coronary disease is moderate so I will send her for coronary CT.  HTN: Her blood pressure is well well.  No change in therapy.  ELEVATED BS: She reports that this has been elevated but I do not have the most recent A1c and I asked her to discuss this with her primary provider.   Current medicines are reviewed at length with the patient today.  The patient does not have concerns regarding medicines.  The following changes have been made:  no change  Labs/ tests ordered today include:   Orders Placed This Encounter  Procedures   CT CORONARY MORPH W/CTA COR W/SCORE W/CA W/CM &/OR WO/CM   Basic metabolic panel   EKG 12-Lead      Disposition:   FU with me in one year.     Signed, Rollene Rotunda, MD  03/22/2021 3:39 PM    [facility]

## 2021-03-22 NOTE — Patient Instructions (Signed)
Medication Instructions:  Your Physician recommend you continue on your current medication as directed.    *If you need a refill on your cardiac medications before your next appointment, please call your pharmacy*   Lab Work: Your physician recommends tab work today (BMP).  If you have labs (blood work) drawn today and your tests are completely normal, you will receive your results only by: MyChart Message (if you have MyChart) OR A paper copy in the mail If you have any lab test that is abnormal or we need to change your treatment, we will call you to review the results.   Testing/Procedures: Cardiac CT Angiography (CTA), is a special type of CT scan that uses a computer to produce multi-dimensional views of major blood vessels throughout the body. In CT angiography, a contrast material is injected through an IV to help visualize the blood vessels [facility]   Follow-Up: At [facility], you and your health needs are our priority.  As part of our continuing mission to provide you with exceptional heart care, we have created designated Provider Care Teams.  These Care Teams include your primary Cardiologist (physician) and Advanced Practice Providers (APPs -  Physician Assistants and Nurse Practitioners) who all work together to provide you with the care you need, when you need it.  We recommend signing up for the patient portal called "MyChart".  Sign up information is provided on this After Visit Summary.  MyChart is used to connect with patients for Virtual Visits (Telemedicine).  Patients are able to view lab/test results, encounter notes, upcoming appointments, etc.  Non-urgent messages can be sent to your provider as well.   To learn more about what you can do with MyChart, go to [email or web address].    Your next appointment:   As needed  The format for your next appointment:   In Person  Provider:   Rollene Rotunda, MD     Your cardiac CT will be scheduled  at one of the below locations:   [facility] [address] [place], [place] [address] [phone]   If scheduled at [facility], please arrive at the [facility] main entrance (entrance A) of [facility] 30 minutes prior to test start time. Proceed to the [facility] Radiology Department (first floor) to check-in and test prep.  If scheduled at [facility], please arrive 15 mins early for check-in and test prep.  Please follow these instructions carefully (unless otherwise directed):  On the Night Before the Test: Be sure to Drink plenty of water. Do not consume any caffeinated/decaffeinated beverages or chocolate 12 hours prior to your test. Do not take any antihistamines 12 hours prior to your test.   On the Day of the Test: Drink plenty of water until 1 hour prior to the test. Do not eat any food 4 hours prior to the test. You may take your regular medications prior to the test.  Take metoprolol (Lopressor) 100 mg two hours prior to test. FEMALES- please wear underwire-free bra if available, avoid dresses & tight clothing        After the Test: Drink plenty of water. After receiving IV contrast, you may experience a mild flushed feeling. This is normal. On occasion, you may experience a mild rash up to 24 hours after the test. This is not dangerous. If this occurs, you can take Benadryl 25 mg and increase your fluid intake. If you experience trouble breathing, this can be serious. If  it is severe call 911 IMMEDIATELY. If it is mild, please call our office. If you take any of these medications: Glipizide/Metformin, Avandament, Glucavance, please do not take 48 hours after completing test unless otherwise instructed.  Please allow 2-4 weeks for scheduling of routine cardiac CTs. Some insurance companies require a pre-authorization which may delay scheduling of this test.   For non-scheduling related questions, please  contact the cardiac imaging nurse navigator should you have any questions/concerns: Rockwell Alexandria, Cardiac Imaging Nurse Navigator Larey Brick, Cardiac Imaging Nurse Navigator [facility] Direct Office Dial: [phone]   For scheduling needs, including cancellations and rescheduling, please call [place], [phone].

## 2021-03-23 LAB — BASIC METABOLIC PANEL
BUN/Creatinine Ratio: 15 (ref 12–28)
BUN: 11 mg/dL (ref 8–27)
CO2: 21 mmol/L (ref 20–29)
Calcium: 9.2 mg/dL (ref 8.7–10.3)
Chloride: 104 mmol/L (ref 96–106)
Creatinine, Ser: 0.73 mg/dL (ref 0.57–1.00)
Glucose: 95 mg/dL (ref 65–99)
Potassium: 4.3 mmol/L (ref 3.5–5.2)
Sodium: 142 mmol/L (ref 134–144)
eGFR: 90 mL/min/{1.73_m2} (ref >59)

## 2021-03-24 ENCOUNTER — Encounter: Payer: Self-pay | Admitting: *Deleted

## 2021-03-28 ENCOUNTER — Telehealth (HOSPITAL_COMMUNITY): Payer: Self-pay | Admitting: Emergency Medicine

## 2021-03-28 NOTE — Telephone Encounter (Signed)
Reaching out to patient to offer assistance regarding upcoming cardiac imaging study; pt verbalizes understanding of appt date/time, parking situation and where to check in, pre-test NPO status and medications ordered, and verified current allergies; name and call back number provided for further questions should they arise Rockwell Alexandria RN Navigator Cardiac Imaging [facility] [phone] office [phone] cell  Denies iv issues 100mg  metoprolol tart

## 2021-03-30 ENCOUNTER — Other Ambulatory Visit: Payer: Self-pay

## 2021-03-30 ENCOUNTER — Encounter (HOSPITAL_COMMUNITY): Payer: Self-pay

## 2021-03-30 ENCOUNTER — Ambulatory Visit (HOSPITAL_COMMUNITY)
Admission: RE | Admit: 2021-03-30 | Discharge: 2021-03-30 | Disposition: A | Discharge status: Home / Self Care | Payer: Medicare Other | Source: Ambulatory Visit | Attending: Cardiology | Admitting: Cardiology

## 2021-03-30 DIAGNOSIS — R072 Precordial pain: Secondary | ICD-10-CM

## 2021-03-30 MED ORDER — IOHEXOL 350 MG/ML SOLN
95.0000 mL | Freq: Once | INTRAVENOUS | Status: AC | PRN
  Administered 2021-03-30: 95 mL via INTRAVENOUS

## 2021-03-30 MED ORDER — NITROGLYCERIN 0.4 MG SL SUBL
SUBLINGUAL_TABLET | SUBLINGUAL | Status: AC
  Filled 2021-03-30: qty 2

## 2021-03-30 MED ORDER — NITROGLYCERIN 0.4 MG SL SUBL
0.8000 mg | SUBLINGUAL_TABLET | Freq: Once | SUBLINGUAL | Status: AC
  Administered 2021-03-30: 0.8 mg via SUBLINGUAL

## 2021-04-19 ENCOUNTER — Telehealth: Payer: Self-pay | Admitting: Cardiology

## 2021-04-19 NOTE — Telephone Encounter (Signed)
Patient called back in to say that she was speaking with the nurse but got disconnected. Please advise

## 2021-04-19 NOTE — Telephone Encounter (Addendum)
-----   Message from Rollene Rotunda, MD sent at 04/05/2021 10:12 AM EDT ----- She has non obstructive disease.  LAD with less than 50% stenosis.  No change in therapy.  No further work up.  Pain is likely non anginal.  She needs continued risk reduction.  Call Ms. Laffey with the results and send results to Kaleen Mask, MD    pt aware of results, she reports her neurologist told her she had pulmonary HTN based on the CT results. She also reports her bp and pulse are running high. She can do just a little activity and her heart rate will elevate to 106 to 110. She is not taking a cholesterol medication and would also like to discuss starting one due to her calcium score but she has not had her lipids checked recently. Follow up scheduled with dr hochrein at patients request to discuss the above.

## 2021-04-20 NOTE — Progress Notes (Signed)
She has not      Cardiology Office Note   Date:  04/21/2021   ID:  Robyn, Galati 07-Feb-1951, MRN [id number]  PCP:  Kaleen Mask, MD  Cardiologist:   None Referring:  Kaleen Mask, MD  Chief Complaint  Patient presents with   Weakness        History of Present Illness: TANIS HENSARLING is a 70 y.o. female who presents for evaluation of chest pain.  She has had no prior cardiac diagnosis although there was a catheterization that I see from 2008 with normal coronaries.  She had syncope in 2017.  I looked at these results and she had a negative work-up including an echocardiogram with an EF of 55% and no significant wall motion abnormalities.   She had chest pain in May.  This was thought to not be angina but she had continued pain .  Earlier this month she had a CTA which demonstrated minimal calcium and minimal non obstructive coronary plaque.      She still is not feeling well.  Living by herself because she is so anxious about feeling poorly.  She is weak and shaky.  She feels fainting.  She does take her blood pressure and it fluctuates from low 110s to the 150s.  She sees her heart rate going up occasionally into the 110s or higher with minimal activity.  He can then dip down to the 50s.  She feels some palpitations.  She has not had any frank syncope.  She has not had any new chest pressure, neck or arm discomfort.  She has had no new shortness of breath, PND or orthopnea.   Past Medical History:  Diagnosis Date   Anemia    Anxiety    Arthritis    Asthma    Diabetes mellitus    Emphysema of lung (HCC)    Fibromyalgia    GERD (gastroesophageal reflux disease)    Hyperlipidemia    Hypertension    Neuromuscular disorder (HCC)    Polio    Seizures (HCC)    Thyroid disease     Past Surgical History:  Procedure Laterality Date   ABDOMINAL HYSTERECTOMY  1992   partial   APPENDECTOMY     BREAST EXCISIONAL BIOPSY Bilateral 01/19/1998   CESAREAN  SECTION  1984   CHOLECYSTECTOMY N/A 09/13/2015   Procedure: LAPAROSCOPIC CHOLECYSTECTOMY;  Surgeon: Harriette Bouillon, MD;  Location: [facility] OR;  Service: General;  Laterality: N/A;   ERCP N/A 09/12/2015   Procedure: ENDOSCOPIC RETROGRADE CHOLANGIOPANCREATOGRAPHY (ERCP);  Surgeon: Dorena Cookey, MD;  Location: [facility] ENDOSCOPY;  Service: Endoscopy;  Laterality: N/A;   FEMUR DISTAL LOCKING SCREW INSERTION  2004   left foot   LAPAROSCOPIC NISSEN FUNDOPLICATION  05/15/11   lap nissen and niatal hernia    OOPHORECTOMY  2004   TONSILLECTOMY AND ADENOIDECTOMY  1968     Current Outpatient Medications  Medication Sig Dispense Refill   albuterol (PROVENTIL HFA;VENTOLIN HFA) 108 (90 Base) MCG/ACT inhaler Inhale 1-2 puffs into the lungs every 6 (six) hours as needed for wheezing or shortness of breath. 1 Inhaler 0   docusate sodium (COLACE) 100 MG capsule Take 100 mg by mouth 2 (two) times daily.     nitroGLYCERIN (NITROSTAT) 0.4 MG SL tablet Place 0.4 mg under the tongue as directed.     oxyCODONE (ROXICODONE) 5 MG immediate release tablet Take 1 tablet (5 mg total) by mouth every 6 (six) hours as needed for severe pain. (Patient  taking differently: Take 5 mg by mouth every 8 (eight) hours.) 20 tablet 0   phenytoin (DILANTIN) 100 MG ER capsule Take 100 mg by mouth 3 (three) times daily.     polyethylene glycol (MIRALAX / GLYCOLAX) packet Take 17 g by mouth daily.     lisinopril (ZESTRIL) 20 MG tablet Take 1 tablet (20 mg total) by mouth 2 (two) times daily. 180 tablet 3   No current facility-administered medications for this visit.    Allergies:   Aripiprazole, Divalproex sodium, Tramadol, Duloxetine, Codeine, Neurontin [gabapentin], and Wellbutrin [bupropion hcl]    ROS:  Please see the history of present illness.   Otherwise, review of systems are positive for none.   All other systems are reviewed and negative.    PHYSICAL EXAM: VS:  BP (!) 146/60 (BP Location: Left Arm, Patient Position: Sitting, Cuff  Size: Large)   Pulse 81   Ht 5\' 7"  (1.702 m)   Wt 224 lb (101.6 kg)   BMI 35.08 kg/m  , BMI Body mass index is 35.08 kg/m. GENERAL:  Well appearing NECK:  No jugular venous distention, waveform within normal limits, carotid upstroke brisk and symmetric, no bruits, no thyromegaly LUNGS:  Clear to auscultation bilaterally CHEST:  Unremarkable HEART:  PMI not displaced or sustained,S1 and S2 within normal limits, no S3, no S4, no clicks, no rubs, no murmurs ABD:  Flat, positive bowel sounds normal in frequency in pitch, no bruits, no rebound, no guarding, no midline pulsatile mass, no hepatomegaly, no splenomegaly EXT:  2 plus pulses throughout, no edema, no cyanosis no clubbing   EKG:  EKG is not ordered today.   Recent Labs: 03/22/2021: BUN 11; Creatinine, Ser 0.73; Potassium 4.3; Sodium 142    Lipid Panel    Component Value Date/Time   CHOL 165 11/15/2015 0640   TRIG 165 (H) 11/15/2015 0640   HDL 35 (L) 11/15/2015 0640   CHOLHDL 4.7 11/15/2015 0640   VLDL 33 11/15/2015 0640   LDLCALC 97 11/15/2015 0640      Wt Readings from Last 3 Encounters:  04/21/21 224 lb (101.6 kg)  03/22/21 221 lb 3.2 oz (100.3 kg)  10/10/18 234 lb 6.4 oz (106.3 kg)      Other studies Reviewed: Additional studies/ records that were reviewed today include: Echo from 2017. Review of the above records demonstrates:  Please see elsewhere in the note.     ASSESSMENT AND PLAN:  CHEST PAIN:    She had nonobstructive coronary artery disease.  This is a nonanginal discomfort.  No change in therapy.   HTN: Her blood pressure is not controlled and I did review her blood pressure diary from her monitor.  I will increase her lisinopril to 20 mg twice daily.   PALPITATIONS: I will check a 4-week monitor.  She had a slightly elevated TSH earlier this year and she will follow-up with her primary provider about this.  I think she has plans to see an endocrinologist.  LVH: Previous echo several years ago  suggested some mild TR and left ventricular hypertrophy.  I will follow this up with a repeat echocardiogram.   Current medicines are reviewed at length with the patient today.  The patient does not have concerns regarding medicines.  The following changes have been made: As above  Labs/ tests ordered today include:      Orders Placed This Encounter  Procedures   CARDIAC EVENT MONITOR   ECHOCARDIOGRAM COMPLETE       Disposition:  FU with APP in 3 months or sooner if needed   Signed, Rollene Rotunda, MD  04/21/2021 12:51 PM    [facility]

## 2021-04-21 ENCOUNTER — Encounter: Payer: Self-pay | Admitting: Cardiology

## 2021-04-21 ENCOUNTER — Ambulatory Visit (INDEPENDENT_AMBULATORY_CARE_PROVIDER_SITE_OTHER): Payer: Medicare Other | Admitting: Cardiology

## 2021-04-21 ENCOUNTER — Encounter: Payer: Self-pay | Admitting: Radiology

## 2021-04-21 ENCOUNTER — Other Ambulatory Visit: Payer: Self-pay

## 2021-04-21 VITALS — BP 146/60 | HR 81 | Ht 67.0 in | Wt 224.0 lb

## 2021-04-21 DIAGNOSIS — I10 Essential (primary) hypertension: Secondary | ICD-10-CM

## 2021-04-21 DIAGNOSIS — R002 Palpitations: Secondary | ICD-10-CM

## 2021-04-21 DIAGNOSIS — R739 Hyperglycemia, unspecified: Secondary | ICD-10-CM | POA: Diagnosis not present

## 2021-04-21 DIAGNOSIS — R072 Precordial pain: Secondary | ICD-10-CM | POA: Diagnosis not present

## 2021-04-21 DIAGNOSIS — I517 Cardiomegaly: Secondary | ICD-10-CM

## 2021-04-21 MED ORDER — LISINOPRIL 20 MG PO TABS: 20.0000 mg | ORAL_TABLET | Freq: Two times a day (BID) | ORAL | 3 refills | Status: DC

## 2021-04-21 NOTE — Progress Notes (Signed)
Enrolled patient for a 30 day [employer] Event Monitor to be mailed to patients home  

## 2021-04-21 NOTE — Patient Instructions (Addendum)
Medication Instructions:  INCREASE YOUR LISINOPRIL TO 20 MG TWICE A DAY   *If you need a refill on your cardiac medications before your next appointment, please call your pharmacy*  Lab Work: NONE   Testing/Procedures: Your physician has requested that you have an echocardiogram. Echocardiography is a painless test that uses sound waves to create images of your heart. It provides your doctor with information about the size and shape of your heart and how well your heart's chambers and valves are working. This procedure takes approximately one hour. There are no restrictions for this procedure.  [facility] [address]   Your physician has recommended that you wear an event monitor. Event monitors are medical devices that record the heart's electrical activity. Doctors most often [place] these monitors to diagnose arrhythmias. Arrhythmias are problems with the speed or rhythm of the heartbeat. The monitor is a small, portable device. You can wear one while you do your normal daily activities. This is usually used to diagnose what is causing palpitations/syncope (passing out). THIS WILL BE MAILED TO YOU   Follow-Up: At [facility], you and your health needs are our priority.  As part of our continuing mission to provide you with exceptional heart care, we have created designated Provider Care Teams.  These Care Teams include your primary Cardiologist (physician) and Advanced Practice Providers (APPs -  Physician Assistants and Nurse Practitioners) who all work together to provide you with the care you need, when you need it.  We recommend signing up for the patient portal called "MyChart".  Sign up information is provided on this After Visit Summary.  MyChart is used to connect with patients for Virtual Visits (Telemedicine).  Patients are able to view lab/test results, encounter notes, upcoming appointments, etc.  Non-urgent messages can be sent to your provider as well.   To learn more  about what you can do with MyChart, go to [email or web address].    Your next appointment:   3-4 month(s)  The format for your next appointment:   In Person  Provider:   You will see one of the following Advanced Practice Providers on your designated Care Team:   Theodore Demark, PA-C Juanda Crumble, PA-C Joni Reining, DNP, ANP  Other Instructions  [employer] Cardiac Event Monitor Instructions Your physician has requested you wear your cardiac event monitor for _30_ days, (1-30). [employer] may call or text to confirm a shipping address. The monitor will be sent to a land address via UPS. [employer] will not ship a monitor to a PO BOX. It typically takes 3-5 days to receive your monitor after it has been enrolled. [employer] will assist with USPS tracking if your package is delayed. The telephone number for [employer] is [phone]. Once you have received your monitor, please review the enclosed instructions. Instruction tutorials can also be viewed under help and settings on the enclosed cell phone. Your monitor has already been registered assigning a specific monitor serial # to you.  Applying the monitor Remove cell phone from case and turn it on. The cell phone works as IT consultant and needs to be within [employer] of you at all times. The cell phone will need to be charged on a daily basis. We recommend you plug the cell phone into the enclosed charger at your bedside table every night.  Monitor batteries: You will receive two monitor batteries labelled #1 and #2. These are your recorders. Plug battery #2 onto the second connection on the enclosed charger. Keep  one battery on the charger at all times. This will keep the monitor battery deactivated. It will also keep it fully charged for when you need to switch your monitor batteries. A small light will be blinking on the battery emblem when it is charging. The light on the battery emblem will remain on when the  battery is fully charged.  Open package of a Monitor strip. Insert battery #1 into black hood on strip and gently squeeze monitor battery onto connection as indicated in instruction booklet. Set aside while preparing skin.  Choose location for your strip, vertical or horizontal, as indicated in the instruction booklet. Shave to remove all hair from location. There cannot be any lotions, oils, powders, or colognes on skin where monitor is to be applied. Wipe skin clean with enclosed Saline wipe. Dry skin completely.  Peel paper labeled #1 off the back of the Monitor strip exposing the adhesive. Place the monitor on the chest in the vertical or horizontal position shown in the instruction booklet. One arrow on the monitor strip must be pointing upward. Carefully remove paper labeled #2, attaching remainder of strip to your skin. Try not to create any folds or wrinkles in the strip as you apply it.  Firmly press and release the circle in the center of the monitor battery. You will hear a small beep. This is turning the monitor battery on. The heart emblem on the monitor battery will light up every 5 seconds if the monitor battery in turned on and connected to the patient securely. Do not push and hold the circle down as this turns the monitor battery off. The cell phone will locate the monitor battery. A screen will appear on the cell phone checking the connection of your monitor strip. This may read poor connection initially but change to good connection within the next minute. Once your monitor accepts the connection you will hear a series of 3 beeps followed by a climbing crescendo of beeps. A screen will appear on the cell phone showing the two monitor strip placement options. Touch the picture that demonstrates where you applied the monitor strip.  Your monitor strip and battery are waterproof. You are able to shower, bathe, or swim with the monitor on. They just ask you do not submerge  deeper than 3 feet underwater. We recommend removing the monitor if you are swimming in a lake, river, or ocean.  Your monitor battery will need to be switched to a fully charged monitor battery approximately once a week. The cell phone will alert you of an action which needs to be made.  On the cell phone, tap for details to reveal connection status, monitor battery status, and cell phone battery status. The green dots indicates your monitor is in good status. A red dot indicates there is something that needs your attention.  To record a symptom, click the circle on the monitor battery. In 30-60 seconds a list of symptoms will appear on the cell phone. Select your symptom and tap save. Your monitor will record a sustained or significant arrhythmia regardless of you clicking the button. Some patients do not feel the heart rhythm irregularities. [employer] will notify us of any serious or critical events.  Refer to instruction booklet for instructions on switching batteries, changing strips, the Do not disturb or Pause features, or any additional questions.  Call [employer] at [phone], to confirm your monitor is transmitting and record your baseline. They will answer any questions you may have regarding the monitor  instructions at that time.  Returning the monitor to [place] all equipment back into blue box. Peel off strip of paper to expose adhesive and close box securely. There is a prepaid UPS shipping label on this box. Drop in a UPS drop box, or at a UPS facility like [employer]. You may also contact [employer] to arrange UPS to pick up monitor package at your home.

## 2021-05-01 ENCOUNTER — Telehealth: Payer: Self-pay

## 2021-05-01 NOTE — Telephone Encounter (Signed)
NOTES SCANNED TO REFERRAL 

## 2021-05-12 ENCOUNTER — Ambulatory Visit (INDEPENDENT_AMBULATORY_CARE_PROVIDER_SITE_OTHER): Payer: Medicare Other

## 2021-05-12 DIAGNOSIS — R002 Palpitations: Secondary | ICD-10-CM

## 2021-05-16 ENCOUNTER — Telehealth: Payer: Self-pay | Admitting: Cardiology

## 2021-05-16 DIAGNOSIS — R519 Headache, unspecified: Secondary | ICD-10-CM

## 2021-05-16 DIAGNOSIS — I10 Essential (primary) hypertension: Secondary | ICD-10-CM

## 2021-05-16 DIAGNOSIS — R030 Elevated blood-pressure reading, without diagnosis of hypertension: Secondary | ICD-10-CM

## 2021-05-16 NOTE — Telephone Encounter (Signed)
Pt c/o BP issue: STAT if pt c/o blurred vision, one-sided weakness or slurred speech  1. What are your last 5 BP readings? 147/126 HR 62-  2. Are you having any other symptoms (ex. Dizziness, headache, blurred vision, passed out)? Blurred vision, dizziness, headache blurred vision, feeling week/faint   3. What is your BP issue? high  Pressure in neck since the 10/12.  Pt c/o of Chest Pain: STAT if CP now or developed within 24 hours  1. Are you having CP right now? no  2. Are you experiencing any other symptoms (ex. SOB, nausea, vomiting, sweating)? Sob, sweating  3. How long have you been experiencing CP? During the night   4. Is your CP continuous or coming and going? Coming and going   5. Have you taken Nitroglycerin? When its real bad yes ? I called at least 4 times and no one to answer

## 2021-05-16 NOTE — Telephone Encounter (Signed)
Returned the call to the patient. She stated that she has been having fluctuating blood pressures and since September 17th until today, she has had 10 times where her blood pressure cuff has shown an irregular heart rhythm.   She is currently on lisinopril 20 mg bid but stated she does not always take it. She takes her blood pressure frequently throughout the day and if her blood pressure is in the low 100's systolic then she will not take it.   Friday-Sunday she did not take any Lisinopril because her blood pressure was 105/61, 110/67 and 113/76.  Today at 12:30, her blood pressure was 147/126. She did not take any lisinopril. The last time she took one was yesterday morning. While on the phone it was 120/69 and heart rate was 58  The patient stated that occasionally she will also have blurry vision and a headache at the base of her skull. She stated that she was also switched to oxycodone and wonders if that could be the culprit.   The patient was currently asymptomatic. She said that she does not feel like she should take any Lisinopril unless her blood pressure is elevated. She has been educated on not taking her blood pressure so frequently. She has been taking it hourly on occasion.   The patient is currently wearing a cardiac monitor and has an echo scheduled 10/21.

## 2021-05-17 ENCOUNTER — Other Ambulatory Visit (HOSPITAL_COMMUNITY): Payer: Medicare Other

## 2021-05-17 ENCOUNTER — Telehealth: Payer: Self-pay | Admitting: Cardiology

## 2021-05-17 NOTE — Telephone Encounter (Signed)
Left a message for the pt to call back... included that our phones are off at 5 pm and that we will back in the office at 8 am tomorrow morning and will try to call her again.

## 2021-05-17 NOTE — Telephone Encounter (Signed)
Left a message for the patient to call back.  Rollene Rotunda, MD  You 23 hours ago (5:33 PM)   Lets have her wear a 24-hour blood pressure monitor but not take her blood pressure at all that day.

## 2021-05-17 NOTE — Telephone Encounter (Signed)
Patient is calling because her eye doctor told her some of her vision problem she is having could be from a carotid artery blockage.  She states sometimes she loses her vision 5-10 seconds.  This started on 10/13.  She only notices at night when she trying to fall asleep.

## 2021-05-18 NOTE — Telephone Encounter (Signed)
Duplicate. See other message.

## 2021-05-18 NOTE — Telephone Encounter (Signed)
Spoke to the patient. She has been made aware of Dr. Jenene Slicker recommendations and is agreement to wear the 24 hour blood pressure monitor. Orders have been placed.  She also stated that her ophthalmologist is concerned about her blurry vision and pressure at the base of her skull. He recommended that she speak with her cardiologist and get a carotid doppler completed.

## 2021-05-19 ENCOUNTER — Ambulatory Visit (HOSPITAL_COMMUNITY): Payer: Medicare Other | Attending: Cardiology

## 2021-05-19 ENCOUNTER — Other Ambulatory Visit: Payer: Self-pay

## 2021-05-19 DIAGNOSIS — I517 Cardiomegaly: Secondary | ICD-10-CM | POA: Insufficient documentation

## 2021-05-19 DIAGNOSIS — I10 Essential (primary) hypertension: Secondary | ICD-10-CM | POA: Diagnosis not present

## 2021-05-19 DIAGNOSIS — R072 Precordial pain: Secondary | ICD-10-CM | POA: Insufficient documentation

## 2021-05-19 LAB — ECHOCARDIOGRAM COMPLETE
Area-P 1/2: 4.3 cm2
S' Lateral: 2.7 cm

## 2021-05-26 ENCOUNTER — Telehealth: Payer: Self-pay

## 2021-05-26 NOTE — Telephone Encounter (Signed)
Spoke to patient we received monitor strip from 10/26 at 5:03 pm revealed sinus tach rate 110.Stated she notices heart flutters at times.Advised continue same medication.Advised continue to wear monitor as ordered.

## 2021-06-05 ENCOUNTER — Ambulatory Visit (INDEPENDENT_AMBULATORY_CARE_PROVIDER_SITE_OTHER): Payer: Medicare Other

## 2021-06-05 ENCOUNTER — Other Ambulatory Visit: Payer: Self-pay

## 2021-06-05 DIAGNOSIS — R519 Headache, unspecified: Secondary | ICD-10-CM

## 2021-06-05 DIAGNOSIS — R030 Elevated blood-pressure reading, without diagnosis of hypertension: Secondary | ICD-10-CM | POA: Diagnosis not present

## 2021-06-05 NOTE — Progress Notes (Unsigned)
24 Hour ambulatory blood pressure monitor applied to patient using adult large cuff.

## 2021-06-14 ENCOUNTER — Encounter: Payer: Self-pay | Admitting: *Deleted

## 2021-06-26 ENCOUNTER — Encounter: Payer: Self-pay | Admitting: *Deleted

## 2021-06-26 ENCOUNTER — Telehealth: Payer: Self-pay | Admitting: Cardiology

## 2021-06-26 NOTE — Telephone Encounter (Signed)
Rollene Rotunda, MD  06/20/2021 10:48 AM EST     No significant arrhythmias.  No change in therapy.  Call Ms. Leming with the results and send results to Kaleen Mask, MD      Rollene Rotunda, MD  06/11/2021 11:00 AM EST      Her on the same dose of meds.  BPs are for the most part controlled.  Call Ms. Dull with the results and send results to Kaleen Mask, MD     Returned call to patient-she reports she was taking lisinopril 20 mg while wearing the BP monitor.  She reports when she took lisinopril 40 mg her BP was dropping and causing her to be dizzy and lightheaded.   She reports she continues to see "irregular" heart beat on her BP monitor.   She also states she requested records from the last OV and it states she has "grade one heart failure".   Discussed her echo and advised she may be referring to "grade one diastolic dysfunction".    Offered sooner appointment to discuss ongoing symptoms and concerns but patient declined at this time.   Advised to call back if symptoms change or continue.

## 2021-06-26 NOTE — Telephone Encounter (Signed)
Pt mailed in her heart monitor 2 weeks ago and she have not heard back from our office regarding the results [phone]

## 2021-08-04 ENCOUNTER — Ambulatory Visit: Payer: Medicare Other | Admitting: Adult Health

## 2021-12-15 DIAGNOSIS — L659 Nonscarring hair loss, unspecified: Secondary | ICD-10-CM | POA: Insufficient documentation

## 2022-02-20 ENCOUNTER — Encounter (HOSPITAL_COMMUNITY): Payer: Self-pay

## 2022-02-20 ENCOUNTER — Emergency Department (HOSPITAL_COMMUNITY): Payer: Medicare Other

## 2022-02-20 ENCOUNTER — Emergency Department (HOSPITAL_COMMUNITY)
Admission: EM | Admit: 2022-02-20 | Discharge: 2022-02-20 | Disposition: A | Discharge status: Home / Self Care | Payer: Medicare Other | Attending: Emergency Medicine | Admitting: Emergency Medicine

## 2022-02-20 DIAGNOSIS — Z79899 Other long term (current) drug therapy: Secondary | ICD-10-CM | POA: Diagnosis not present

## 2022-02-20 DIAGNOSIS — Z20822 Contact with and (suspected) exposure to covid-19: Secondary | ICD-10-CM | POA: Diagnosis not present

## 2022-02-20 DIAGNOSIS — R0600 Dyspnea, unspecified: Secondary | ICD-10-CM | POA: Diagnosis present

## 2022-02-20 DIAGNOSIS — I10 Essential (primary) hypertension: Secondary | ICD-10-CM | POA: Insufficient documentation

## 2022-02-20 DIAGNOSIS — E039 Hypothyroidism, unspecified: Secondary | ICD-10-CM | POA: Insufficient documentation

## 2022-02-20 DIAGNOSIS — E119 Type 2 diabetes mellitus without complications: Secondary | ICD-10-CM | POA: Insufficient documentation

## 2022-02-20 DIAGNOSIS — R531 Weakness: Secondary | ICD-10-CM | POA: Diagnosis not present

## 2022-02-20 DIAGNOSIS — K115 Sialolithiasis: Secondary | ICD-10-CM | POA: Insufficient documentation

## 2022-02-20 DIAGNOSIS — R1310 Dysphagia, unspecified: Secondary | ICD-10-CM

## 2022-02-20 DIAGNOSIS — J45909 Unspecified asthma, uncomplicated: Secondary | ICD-10-CM | POA: Diagnosis not present

## 2022-02-20 LAB — CBC WITH DIFFERENTIAL/PLATELET
Abs Immature Granulocytes: 0.02 10*3/uL (ref 0.00–0.07)
Basophils Absolute: 0 10*3/uL (ref 0.0–0.1)
Basophils Relative: 1 %
Eosinophils Absolute: 0.1 10*3/uL (ref 0.0–0.5)
Eosinophils Relative: 1 %
HCT: 42.1 % (ref 36.0–46.0)
Hemoglobin: 14 g/dL (ref 12.0–15.0)
Immature Granulocytes: 0 %
Lymphocytes Relative: 35 %
Lymphs Abs: 2.2 10*3/uL (ref 0.7–4.0)
MCH: 32 pg (ref 26.0–34.0)
MCHC: 33.3 g/dL (ref 30.0–36.0)
MCV: 96.3 fL (ref 80.0–100.0)
Monocytes Absolute: 0.4 10*3/uL (ref 0.1–1.0)
Monocytes Relative: 6 %
Neutro Abs: 3.6 10*3/uL (ref 1.7–7.7)
Neutrophils Relative %: 57 %
Platelets: 158 10*3/uL (ref 150–400)
RBC: 4.37 MIL/uL (ref 3.87–5.11)
RDW: 12.6 % (ref 11.5–15.5)
WBC: 6.3 10*3/uL (ref 4.0–10.5)
nRBC: 0 % (ref 0.0–0.2)

## 2022-02-20 LAB — COMPREHENSIVE METABOLIC PANEL
ALT: 18 U/L (ref 0–44)
AST: 22 U/L (ref 15–41)
Albumin: 4.2 g/dL (ref 3.5–5.0)
Alkaline Phosphatase: 101 U/L (ref 38–126)
Anion gap: 6 (ref 5–15)
BUN: 9 mg/dL (ref 8–23)
CO2: 28 mmol/L (ref 22–32)
Calcium: 9.2 mg/dL (ref 8.9–10.3)
Chloride: 107 mmol/L (ref 98–111)
Creatinine, Ser: 0.62 mg/dL (ref 0.44–1.00)
GFR, Estimated: >60 mL/min (ref >60)
Glucose, Bld: 99 mg/dL (ref 70–99)
Potassium: 3.8 mmol/L (ref 3.5–5.1)
Sodium: 141 mmol/L (ref 135–145)
Total Bilirubin: 0.7 mg/dL (ref 0.3–1.2)
Total Protein: 7.7 g/dL (ref 6.5–8.1)

## 2022-02-20 LAB — GROUP A STREP BY PCR: Group A Strep by PCR: NOT DETECTED

## 2022-02-20 LAB — SARS CORONAVIRUS 2 BY RT PCR: SARS Coronavirus 2 by RT PCR: NEGATIVE

## 2022-02-20 LAB — URINALYSIS, ROUTINE W REFLEX MICROSCOPIC
Bacteria, UA: NONE SEEN
Bilirubin Urine: NEGATIVE
Glucose, UA: NEGATIVE mg/dL
Hgb urine dipstick: NEGATIVE
Ketones, ur: NEGATIVE mg/dL
Nitrite: NEGATIVE
Protein, ur: NEGATIVE mg/dL
Specific Gravity, Urine: 1.024 (ref 1.005–1.030)
pH: 5 (ref 5.0–8.0)

## 2022-02-20 LAB — TROPONIN I (HIGH SENSITIVITY)
Troponin I (High Sensitivity): 5 ng/L (ref <18)
Troponin I (High Sensitivity): 5 ng/L (ref <18)

## 2022-02-20 MED ORDER — METHYLPREDNISOLONE SODIUM SUCC 125 MG IJ SOLR
125.0000 mg | Freq: Once | INTRAMUSCULAR | Status: AC
  Administered 2022-02-20: 125 mg via INTRAVENOUS
  Filled 2022-02-20: qty 2

## 2022-02-20 MED ORDER — LORAZEPAM 2 MG/ML IJ SOLN
0.5000 mg | Freq: Once | INTRAMUSCULAR | Status: DC
  Filled 2022-02-20: qty 1

## 2022-02-20 MED ORDER — STERILE WATER FOR INJECTION IJ SOLN
INTRAMUSCULAR | Status: AC
  Administered 2022-02-20: 2 mL
  Filled 2022-02-20: qty 10

## 2022-02-20 MED ORDER — EPINEPHRINE 0.3 MG/0.3ML IJ SOAJ
0.3000 mg | Freq: Once | INTRAMUSCULAR | Status: AC
  Administered 2022-02-20: 0.3 mg via INTRAMUSCULAR
  Filled 2022-02-20: qty 0.3

## 2022-02-20 MED ORDER — IOHEXOL 300 MG/ML  SOLN
80.0000 mL | Freq: Once | INTRAMUSCULAR | Status: AC | PRN
  Administered 2022-02-20: 80 mL via INTRAVENOUS

## 2022-02-20 MED ORDER — PANTOPRAZOLE SODIUM 20 MG PO TBEC: 40.0000 mg | DELAYED_RELEASE_TABLET | Freq: Every day | ORAL | 0 refills | Status: AC

## 2022-02-20 MED ORDER — HYDROCHLOROTHIAZIDE 12.5 MG PO TABS: 12.5000 mg | ORAL_TABLET | Freq: Every day | ORAL | 0 refills | Status: DC

## 2022-02-20 MED ORDER — DIPHENHYDRAMINE HCL 50 MG/ML IJ SOLN
25.0000 mg | Freq: Once | INTRAMUSCULAR | Status: AC
  Administered 2022-02-20: 25 mg via INTRAVENOUS
  Filled 2022-02-20: qty 1

## 2022-02-20 NOTE — ED Provider Triage Note (Signed)
Emergency Medicine Provider Triage Evaluation Note  Tamara Chambers , a 71 y.o. female  was evaluated in triage.  Pt complains of possible allergic reaction.  Had blood pressure medication switched from lisinopril to ARB.  Her last week she has had some shortness of breath, bumps on the inside of her lips, she feels like her tongue and lips have been intermittently swelling.  She feels like when she eats things get stuck in her throat.  Symptoms are intermittent.  Also notes that she has had a sore throat over the last 2 weeks and seeing a dentist.  She is concerned about strep.  No sick contacts.  Review of Systems  Positive: Shortness of breath, facial swelling Negative:   Physical Exam  BP (!) 149/84   Pulse 71   Temp 98.1 F (36.7 C) (Oral)   Resp 20   Ht 5\' 7"  (1.702 m)   Wt 104.3 kg   SpO2 97%   BMI 36.02 kg/m  Gen:   Awake, no distress   Face:  No angioedema, tongue midline, tolerating secretions Resp:  Normal effort, speaks in full sentences without difficulty MSK:   Moves extremities without difficulty  Other:    Medical Decision Making  Medically screening exam initiated at 5:10 PM.  Appropriate orders placed.  Tamara Chambers was informed that the remainder of the evaluation will be completed by another provider, this initial triage assessment does not replace that evaluation, and the importance of remaining in the ED until their evaluation is complete.  SOB, sore throat   Tamara Chambers A, PA-C 02/20/22 1713

## 2022-02-20 NOTE — ED Provider Notes (Signed)
[facility]-EMERGENCY DEPT Provider Note   CSN: [id number] Arrival date & time: 02/20/22  1636     History {Add pertinent medical, surgical, social history, OB history to HPI:1} Chief Complaint  Patient presents with   Allergic Reaction    Tamara Chambers is a 71 y.o. female.  HPI     71 year old female with a history of seizures, hypothyroidism, fibromyalgia     Per her neurology note she started Lamictal 25 mg at night with plan to increase to 2 twice daily on July 21--but has not started it because of these other changes. In middle of November had kidney pain thought was from that before when she was on it October.  Feet swelling since last November  One week, maybe longer was started on norvasc, switched from lisinopril to norvasc--appt was 6/6 but has only been taking it intermittently due to this   An hour after taking dilantin  Feels like having tongue swelling, throat swelling, difficulty swelling Feels like it is getting worse  Will lean back and stretch esophagus to see if that helps (had prior hiatal hernia) Can swallow liquids Will need to chew up food. Throat feels swollen, like muscles getting   When walk or talk feels a little dyspnea  No rash, nausea, vomiting      Past Medical History:  Diagnosis Date   Anemia    Anxiety    Arthritis    Asthma    Diabetes mellitus    Emphysema of lung (HCC)    Fibromyalgia    GERD (gastroesophageal reflux disease)    Hyperlipidemia    Hypertension    Neuromuscular disorder (HCC)    Polio    Seizures (HCC)    Thyroid disease     Past Surgical History:  Procedure Laterality Date   ABDOMINAL HYSTERECTOMY  1992   partial   APPENDECTOMY     BREAST EXCISIONAL BIOPSY Bilateral 01/19/1998   CESAREAN SECTION  1984   CHOLECYSTECTOMY N/A 09/13/2015   Procedure: LAPAROSCOPIC CHOLECYSTECTOMY;  Surgeon: Harriette Bouillon, MD;  Location: [facility] OR;  Service: General;  Laterality: N/A;   ERCP N/A  09/12/2015   Procedure: ENDOSCOPIC RETROGRADE CHOLANGIOPANCREATOGRAPHY (ERCP);  Surgeon: Dorena Cookey, MD;  Location: [facility] ENDOSCOPY;  Service: Endoscopy;  Laterality: N/A;   FEMUR DISTAL LOCKING SCREW INSERTION  2004   left foot   LAPAROSCOPIC NISSEN FUNDOPLICATION  05/15/11   lap nissen and niatal hernia    OOPHORECTOMY  2004   TONSILLECTOMY AND ADENOIDECTOMY  1968    Home Medications Prior to Admission medications   Medication Sig Start Date End Date Taking? Authorizing Provider  albuterol (PROVENTIL HFA;VENTOLIN HFA) 108 (90 Base) MCG/ACT inhaler Inhale 1-2 puffs into the lungs every 6 (six) hours as needed for wheezing or shortness of breath. 10/09/18   Zadie Rhine, MD  docusate sodium (COLACE) 100 MG capsule Take 100 mg by mouth 2 (two) times daily.    [provider]  lisinopril (ZESTRIL) 20 MG tablet Take 20 mg by mouth daily.    [provider]  nitroGLYCERIN (NITROSTAT) 0.4 MG SL tablet Place 0.4 mg under the tongue as directed.    [provider]  oxyCODONE (ROXICODONE) 5 MG immediate release tablet Take 1 tablet (5 mg total) by mouth every 6 (six) hours as needed for severe pain. Patient taking differently: Take 5 mg by mouth every 8 (eight) hours. 11/15/15   Ghimire, Werner Lean, MD  phenytoin (DILANTIN) 100 MG ER capsule Take 100 mg  by mouth 3 (three) times daily.    [provider]  polyethylene glycol (MIRALAX / GLYCOLAX) packet Take 17 g by mouth daily.    [provider]      Allergies    Aripiprazole, Divalproex sodium, Tramadol, Duloxetine, Codeine, Neurontin [gabapentin], and Wellbutrin [bupropion hcl]    Review of Systems   Review of Systems  Physical Exam Updated Vital Signs BP (!) 149/84   Pulse 71   Temp 98.1 F (36.7 C) (Oral)   Resp 20   Ht 5\' 7"  (1.702 m)   Wt 104.3 kg   SpO2 97%   BMI 36.02 kg/m  Physical Exam  ED Results / Procedures / Treatments   Labs (all labs ordered are listed, but only abnormal  results are displayed) Labs Reviewed  GROUP A STREP BY PCR  SARS CORONAVIRUS 2 BY RT PCR  CBC WITH DIFFERENTIAL/PLATELET  COMPREHENSIVE METABOLIC PANEL  URINALYSIS, ROUTINE W REFLEX MICROSCOPIC  TROPONIN I (HIGH SENSITIVITY)    EKG None  Radiology DG Chest 2 View  Result Date: 02/20/2022 CLINICAL DATA:  Pt presents with c/o allergic reaction. Pt reports that she started taking Norvasc approx one week ago and has been having some lip and mouth swelling over the last week. Pt reports she feels short of breath, no distress noted. EXAM: CHEST - 2 VIEW COMPARISON:  Or 06/19/2019 FINDINGS: Lungs are clear. Heart size and mediastinal contours are within normal limits. No effusion. Anterior vertebral endplate spurring at multiple levels in the mid thoracic spine. IMPRESSION: No acute cardiopulmonary disease. Electronically Signed   By: 06/21/2019 M.D.   On: 02/20/2022 17:25    Procedures Procedures  {Document cardiac monitor, telemetry assessment procedure when appropriate:1}  Medications Ordered in ED Medications - No data to display  ED Course/ Medical Decision Making/ A&P                           Medical Decision Making Amount and/or Complexity of Data Reviewed Labs: ordered.  Risk Prescription drug management.   ***  {Document critical care time when appropriate:1} {Document review of labs and clinical decision tools ie heart score, Chads2Vasc2 etc:1}  {Document your independent review of radiology images, and any outside records:1} {Document your discussion with family members, caretakers, and with consultants:1} {Document social determinants of health affecting pt's care:1} {Document your decision making why or why not admission, treatments were needed:1} Final Clinical Impression(s) / ED Diagnoses Final diagnoses:  None    Rx / DC Orders ED Discharge Orders     None

## 2022-02-20 NOTE — Discharge Instructions (Addendum)
Please stop taking the norvasc.

## 2022-02-20 NOTE — ED Triage Notes (Signed)
Pt presents with c/o allergic reaction. Pt reports that she started taking Norvasc approx one week ago and has been having some lip and mouth swelling over the last week. Pt reports she feels short of breath, no distress noted. Pt reports she has been choking on her food because she feels that her throat is swelling. Pt able to speak in complete sentences.

## 2022-05-01 ENCOUNTER — Emergency Department (HOSPITAL_COMMUNITY): Payer: Medicare Other

## 2022-05-01 ENCOUNTER — Emergency Department (HOSPITAL_COMMUNITY)
Admission: EM | Admit: 2022-05-01 | Discharge: 2022-05-01 | Disposition: A | Discharge status: Home / Self Care | Payer: Medicare Other | Attending: Emergency Medicine | Admitting: Emergency Medicine

## 2022-05-01 ENCOUNTER — Encounter (HOSPITAL_COMMUNITY): Payer: Self-pay

## 2022-05-01 DIAGNOSIS — I10 Essential (primary) hypertension: Secondary | ICD-10-CM | POA: Insufficient documentation

## 2022-05-01 DIAGNOSIS — Z7982 Long term (current) use of aspirin: Secondary | ICD-10-CM | POA: Diagnosis not present

## 2022-05-01 DIAGNOSIS — R0789 Other chest pain: Secondary | ICD-10-CM | POA: Diagnosis not present

## 2022-05-01 DIAGNOSIS — R079 Chest pain, unspecified: Secondary | ICD-10-CM | POA: Diagnosis present

## 2022-05-01 DIAGNOSIS — Z79899 Other long term (current) drug therapy: Secondary | ICD-10-CM | POA: Insufficient documentation

## 2022-05-01 LAB — CBC
HCT: 41.8 % (ref 36.0–46.0)
Hemoglobin: 14.1 g/dL (ref 12.0–15.0)
MCH: 31.8 pg (ref 26.0–34.0)
MCHC: 33.7 g/dL (ref 30.0–36.0)
MCV: 94.1 fL (ref 80.0–100.0)
Platelets: 144 10*3/uL — ABNORMAL LOW (ref 150–400)
RBC: 4.44 MIL/uL (ref 3.87–5.11)
RDW: 12 % (ref 11.5–15.5)
WBC: 6.5 10*3/uL (ref 4.0–10.5)
nRBC: 0 % (ref 0.0–0.2)

## 2022-05-01 LAB — BASIC METABOLIC PANEL
Anion gap: 8 (ref 5–15)
BUN: 16 mg/dL (ref 8–23)
CO2: 25 mmol/L (ref 22–32)
Calcium: 8.6 mg/dL — ABNORMAL LOW (ref 8.9–10.3)
Chloride: 106 mmol/L (ref 98–111)
Creatinine, Ser: 0.62 mg/dL (ref 0.44–1.00)
GFR, Estimated: >60 mL/min (ref >60)
Glucose, Bld: 95 mg/dL (ref 70–99)
Potassium: 3.6 mmol/L (ref 3.5–5.1)
Sodium: 139 mmol/L (ref 135–145)

## 2022-05-01 LAB — TROPONIN I (HIGH SENSITIVITY)
Troponin I (High Sensitivity): 6 ng/L (ref <18)
Troponin I (High Sensitivity): 8 ng/L (ref <18)

## 2022-05-01 NOTE — ED Triage Notes (Signed)
Pt states that today at approximately 1430 she began having mid-sternal non-radiating CP. Pt states that she self-administered one SL NTG with relief. EMS administered 324 mg ASA.

## 2022-05-01 NOTE — ED Provider Triage Note (Signed)
Emergency Medicine Provider Triage Evaluation Note  Tamara Chambers , a 71 y.o. female  was evaluated in triage.  Pt complains of chest pain.  Approximately 1:30 PM today she was started to feel very upset going on at work when she suddenly noticed midsternal nonradiating chest pain.  She says it was getting worse, so about 10 minutes following this she decided take her nitroglycerin which immediately relieved the pain.  She also was given 325 aspirin with EMS.  She denies any numbness, tingling, dizziness, syncope, shortness of breath, abdominal pain, nausea, or vomiting  Review of Systems  Positive:  Negative:   Physical Exam  BP (!) 154/81 (BP Location: Right Arm)   Pulse 67   Temp 99 F (37.2 C)   Resp 18   Ht 5\' 7"  (1.702 m)   Wt 104.3 kg   SpO2 98%   BMI 36.02 kg/m  Gen:   Awake, no distress   Resp:  Normal effort  MSK:   Moves extremities without difficulty  Other:  Lungs Clear. Pulses Equal. No LE swelling.  Medical Decision Making  Medically screening exam initiated at 4:05 PM.  Appropriate orders placed.  ANGELYNE TERWILLIGER was informed that the remainder of the evaluation will be completed by another provider, this initial triage assessment does not replace that evaluation, and the importance of remaining in the ED until their evaluation is complete.     Adolphus Birchwood, PA-C 05/01/22 1606

## 2022-05-01 NOTE — Discharge Instructions (Addendum)
Your workup today was reassuring. Please schedule follow up visit with Dr. Percival Spanish. Please return with worsening symptoms.

## 2022-05-01 NOTE — ED Provider Notes (Signed)
[facility] DEPT Provider Note   CSN: [id number] Arrival date & time: 05/01/22  1550     History PMH: DM2, GERD, Tobacco use history, HLD, anxiety Chief Complaint  Patient presents with   Chest Pain    Tamara Chambers is a 71 y.o. female.  Pt complains of chest pain.  Approximately 1:30 PM today she was started to feel very upset going on at work when she suddenly noticed midsternal nonradiating chest pain.  She says it was getting worse, so about 10 minutes following this she decided take her nitroglycerin which immediately relieved the pain.  She also was given 325 aspirin with EMS.  She denies any numbness, tingling, dizziness, syncope, shortness of breath, abdominal pain, nausea, or vomiting   Chest Pain      Home Medications Prior to Admission medications   Medication Sig Start Date End Date Taking? Authorizing Provider  albuterol (PROVENTIL HFA;VENTOLIN HFA) 108 (90 Base) MCG/ACT inhaler Inhale 1-2 puffs into the lungs every 6 (six) hours as needed for wheezing or shortness of breath. 10/09/18   Ripley Fraise, MD  docusate sodium (COLACE) 100 MG capsule Take 100 mg by mouth 2 (two) times daily.    [provider]  hydrochlorothiazide (HYDRODIURIL) 12.5 MG tablet Take 1 tablet (12.5 mg total) by mouth daily. 02/20/22 03/22/22  Gareth Morgan, MD  nitroGLYCERIN (NITROSTAT) 0.4 MG SL tablet Place 0.4 mg under the tongue as directed.    [provider]  oxyCODONE (ROXICODONE) 5 MG immediate release tablet Take 1 tablet (5 mg total) by mouth every 6 (six) hours as needed for severe pain. Patient taking differently: Take 5 mg by mouth every 8 (eight) hours. 11/15/15   Ghimire, Henreitta Leber, MD  pantoprazole (PROTONIX) 20 MG tablet Take 2 tablets (40 mg total) by mouth daily for 14 days. 02/20/22 03/06/22  Gareth Morgan, MD  phenytoin (DILANTIN) 100 MG ER capsule Take 100 mg by mouth 3 (three) times daily.    [provider]   polyethylene glycol (MIRALAX / GLYCOLAX) packet Take 17 g by mouth daily.    [provider]      Allergies    Aripiprazole, Divalproex sodium, Tramadol, Duloxetine, Codeine, Neurontin [gabapentin], and Wellbutrin [bupropion hcl]    Review of Systems   Review of Systems  Cardiovascular:  Positive for chest pain.  All other systems reviewed and are negative.   Physical Exam Updated Vital Signs BP (!) 148/92   Pulse 79   Temp 98.4 F (36.9 C) (Oral)   Resp 16   Ht 5\' 7"  (1.702 m)   Wt 104.3 kg   SpO2 95%   BMI 36.02 kg/m  Physical Exam Vitals and nursing note reviewed.  Constitutional:      General: She is not in acute distress.    Appearance: Normal appearance. She is not ill-appearing, toxic-appearing or diaphoretic.  HENT:     Head: Normocephalic and atraumatic.     Nose: No nasal deformity.     Mouth/Throat:     Lips: Pink. No lesions.     Mouth: Mucous membranes are moist. No injury, lacerations, oral lesions or angioedema.     Pharynx: Oropharynx is clear. Uvula midline. No pharyngeal swelling, oropharyngeal exudate, posterior oropharyngeal erythema or uvula swelling.  Eyes:     General: Gaze aligned appropriately. No scleral icterus.       Right eye: No discharge.        Left eye: No discharge.     Conjunctiva/sclera:  Conjunctivae normal.     Right eye: Right conjunctiva is not injected. No exudate or hemorrhage.    Left eye: Left conjunctiva is not injected. No exudate or hemorrhage. Cardiovascular:     Rate and Rhythm: Normal rate and regular rhythm.     Pulses: Normal pulses.          Radial pulses are 2+ on the right side and 2+ on the left side.       Dorsalis pedis pulses are 2+ on the right side and 2+ on the left side.     Heart sounds: Normal heart sounds, S1 normal and S2 normal. Heart sounds not distant. No murmur heard.    No friction rub. No gallop. No S3 or S4 sounds.  Pulmonary:     Effort: Pulmonary effort is normal. No accessory  muscle usage or respiratory distress.     Breath sounds: Normal breath sounds. No stridor. No decreased breath sounds, wheezing, rhonchi or rales.  Chest:     Chest wall: No tenderness.  Abdominal:     General: Abdomen is flat. Bowel sounds are normal. There is no distension.     Palpations: Abdomen is soft. There is no mass or pulsatile mass.     Tenderness: There is no abdominal tenderness. There is no guarding or rebound.  Musculoskeletal:     Right lower leg: No tenderness. No edema.     Left lower leg: No tenderness. No edema.  Skin:    General: Skin is warm and dry.     Coloration: Skin is not jaundiced or pale.     Findings: No bruising, erythema, lesion or rash.  Neurological:     General: No focal deficit present.     Mental Status: She is alert and oriented to person, place, and time.     GCS: GCS eye subscore is 4. GCS verbal subscore is 5. GCS motor subscore is 6.  Psychiatric:        Mood and Affect: Mood normal.        Behavior: Behavior normal. Behavior is cooperative.     ED Results / Procedures / Treatments   Labs (all labs ordered are listed, but only abnormal results are displayed) Labs Reviewed  BASIC METABOLIC PANEL - Abnormal; Notable for the following components:      Result Value   Calcium 8.6 (*)    All other components within normal limits  CBC - Abnormal; Notable for the following components:   Platelets 144 (*)    All other components within normal limits  TROPONIN I (HIGH SENSITIVITY)  TROPONIN I (HIGH SENSITIVITY)    EKG EKG Interpretation  Date/Time:  Tuesday May 01 2022 15:57:50 EDT Ventricular Rate:  67 PR Interval:  152 QRS Duration: 104 QT Interval:  390 QTC Calculation: 412 R Axis:   49 Text Interpretation: Sinus rhythm Low voltage, precordial leads Borderline T abnormalities, anterior leads Artifact Abnormal ECG Confirmed by Gerhard Munch [phone]) on 05/01/2022 9:08:36 PM  Radiology DG Chest Port 1 View  Result Date:  05/01/2022 CLINICAL DATA:  Chest pain. EXAM: PORTABLE CHEST 1 VIEW COMPARISON:  February 20, 2022. FINDINGS: The heart size and mediastinal contours are within normal limits. Both lungs are clear. The visualized skeletal structures are unremarkable. IMPRESSION: No active disease. Electronically Signed   By: Lupita Raider M.D.   On: 05/01/2022 16:56    Procedures Procedures  This patient was on telemetry or cardiac monitoring during their time in the ED.  Medications Ordered in ED Medications - No data to display  ED Course/ Medical Decision Making/ A&P                           Medical Decision Making Amount and/or Complexity of Data Reviewed Labs: ordered. Radiology: ordered.    MDM  This is a 71 y.o. female who presents to the ED with chest pain The differential of this patient includes but is not limited to ACS/NSTEMI, Aortic Dissection, PE, PTX, GERD, MSK, Pericarditis, PNA, and Dermatomal Rash   Initial Impression  Well appearing, no acute distress, not diaphoretic Normotensive, afebrile, HDS Asymptomatic now Chest pain workup initiated  I personally ordered, reviewed, and interpreted all laboratory work and imaging and agree with radiologist interpretation. Results interpreted below:  CBC reassuring, BMP with stable renal function and electrolytes, troponin negative x 2 Chest XR normal EKG unchanged from prior and nonischemic pattern  Assessment/Plan:  Reassuring cardiac workup. Low suspicion for ACS.  HEART score 5. With shared decision making, we have decided the best course of action would be to follow up with cardiologist outpatient.  Low suspicion for PE, dissection. No further workup indicated.   Charting Requirements Additional history is obtained from:  Independent historian External Records from outside source obtained and reviewed including: Prior Cards note from 2022 Social Determinants of Health:  none Pertinant PMH that complicates patient's illness:  HTN  Patient Care Problems that were addressed during this visit: - Atypical Chest pain: Acute illness with systemic symptoms This patient was maintained on a cardiac monitor/telemetry. I personally viewed and interpreted the cardiac monitor which reveals an underlying rhythm of NSR Medications given in ED: n/a Reevaluation of the patient after these medicines showed that the patient resolved I have reviewed home medications and made changes accordingly.  Critical Care Interventions: n/a Consultations: n/a Disposition: discharge  This is a supervised visit with my attending physician, Dr. Jeraldine Loots. We have discussed this patient and they have altered the plan as needed.  Portions of this note were generated with Scientist, clinical (histocompatibility and immunogenetics). Dictation errors may occur despite best attempts at proofreading.     Final Clinical Impression(s) / ED Diagnoses Final diagnoses:  Atypical chest pain    Rx / DC Orders ED Discharge Orders     None         Therese Sarah 05/01/22 2120    Gerhard Munch, MD 05/01/22 2245

## 2022-05-03 DIAGNOSIS — I517 Cardiomegaly: Secondary | ICD-10-CM | POA: Insufficient documentation

## 2022-05-03 NOTE — Progress Notes (Signed)
She has not      Cardiology Office Note   Date:  05/04/2022   ID:  Tamara Chambers, Tamara Chambers February 11, 1951, MRN [id number]  PCP:  Leonard Downing, MD  Cardiologist:   None Referring:  Leonard Downing, MD  Chief Complaint  Patient presents with   Chest Pain        History of Present Illness: Tamara Chambers is a 71 y.o. female who presents for evaluation of chest pain.  She has had no prior cardiac diagnosis although there was a catheterization that I see from 2008 with normal coronaries.  She had syncope in 2017.  I looked at these results and she had a negative work-up including an echocardiogram with an EF of 55% and no significant wall motion abnormalities.   She had chest pain in May.  This was thought to not be angina but she had continued pain .  She had a CTA which demonstrated minimal calcium and minimal non obstructive coronary plaque.   She had palpitations in Nov 2022 and she had no evidence of arrhythmia on a monitor.    Echo to look at a previous diagnosis of LVH did not confirm LVH.    She called the ambulance the day that she went to the emergency room.  Her blood pressure was 180/100.  She has been taking 12 and half of HCTZ since July.  Her blood pressures are still running in the 810F systolic.  She has been intolerant of ACE inhibitor and amlodipine.  She has been getting some pinching chest discomfort.  She is not getting any substernal pain.  She is not getting any new shortness of breath, PND or orthopnea.  She has a lot of stress and anxiety.        Past Medical History:  Diagnosis Date   Anemia    Anxiety    Arthritis    Asthma    Diabetes mellitus    Emphysema of lung (HCC)    Fibromyalgia    GERD (gastroesophageal reflux disease)    Hyperlipidemia    Hypertension    Neuromuscular disorder ([place])    Polio    Seizures ([place])    Thyroid disease     Past Surgical History:  Procedure Laterality Date   ABDOMINAL HYSTERECTOMY  1992   partial    APPENDECTOMY     BREAST EXCISIONAL BIOPSY Bilateral 01/19/1998   CESAREAN SECTION  1984   CHOLECYSTECTOMY N/A 09/13/2015   Procedure: LAPAROSCOPIC CHOLECYSTECTOMY;  Surgeon: Erroll Luna, MD;  Location: [facility];  Service: General;  Laterality: N/A;   ERCP N/A 09/12/2015   Procedure: ENDOSCOPIC RETROGRADE CHOLANGIOPANCREATOGRAPHY (ERCP);  Surgeon: Teena Irani, MD;  Location: [facility] END[facility]COPY;  Service: Endoscopy;  Laterality: N/A;   FEMUR DISTAL LOCKING SCREW INSERTION  2004   left foot   HIATAL HERNIA REPAIR     2012   LAPAROSCOPIC NISSEN FUNDOPLICATION  75/04/2584   lap nissen and niatal hernia    OOPHORECTOMY  2004   TONSILLECTOMY AND ADENOIDECTOMY  1968     Current Outpatient Medications  Medication Sig Dispense Refill   albuterol (PROVENTIL HFA;VENTOLIN HFA) 108 (90 Base) MCG/ACT inhaler Inhale 1-2 puffs into the lungs every 6 (six) hours as needed for wheezing or shortness of breath. 1 Inhaler 0   docusate sodium (COLACE) 100 MG capsule Take 100 mg by mouth 2 (two) times daily.     nitroGLYCERIN (NITROSTAT) 0.4 MG SL tablet Place 0.4 mg under the tongue as directed.  oxyCODONE (ROXICODONE) 5 MG immediate release tablet Take 1 tablet (5 mg total) by mouth every 6 (six) hours as needed for severe pain. (Patient taking differently: Take 5 mg by mouth every 8 (eight) hours.) 20 tablet 0   phenytoin (DILANTIN) 100 MG ER capsule Take 100 mg by mouth 3 (three) times daily.     polyethylene glycol (MIRALAX / GLYCOLAX) packet Take 17 g by mouth daily.     hydrochlorothiazide (HYDRODIURIL) 25 MG tablet Take 1 tablet (25 mg total) by mouth daily. 90 tablet 3   pantoprazole (PROTONIX) 20 MG tablet Take 2 tablets (40 mg total) by mouth daily for 14 days. 28 tablet 0   No current facility-administered medications for this visit.    Allergies:   Aripiprazole, Divalproex sodium, Tramadol, Amlodipine, Duloxetine, Codeine, Meloxicam, Neurontin [gabapentin], and Wellbutrin [bupropion hcl]    ROS:   Please see the history of present illness.   Otherwise, review of systems are positive for none.   All other systems are reviewed and negative.    PHYSICAL EXAM: VS:  BP (!) 168/82   Pulse 99   Ht 5\' 7"  (1.702 m)   Wt 227 lb 3.2 oz (103.1 kg)   SpO2 96%   BMI 35.58 kg/m  , BMI Body mass index is 35.58 kg/m. GENERAL:  Well appearing NECK:  No jugular venous distention, waveform within normal limits, carotid upstroke brisk and symmetric, no bruits, no thyromegaly LUNGS:  Clear to auscultation bilaterally CHEST:  Unremarkable HEART:  PMI not displaced or sustained,S1 and S2 within normal limits, no S3, no S4, no clicks, no rubs, no murmurs ABD:  Flat, positive bowel sounds normal in frequency in pitch, no bruits, no rebound, no guarding, no midline pulsatile mass, no hepatomegaly, no splenomegaly EXT:  2 plus pulses throughout, no edema, no cyanosis no clubbing  EKG:  EKG is  not ordered today. NA  Recent Labs: 02/20/2022: ALT 18 05/01/2022: BUN 16; Creatinine, Ser 0.62; Hemoglobin 14.1; Platelets 144; Potassium 3.6; Sodium 139    Lipid Panel    Component Value Date/Time   CHOL 165 11/15/2015 0640   TRIG 165 (H) 11/15/2015 0640   HDL 35 (L) 11/15/2015 0640   CHOLHDL 4.7 11/15/2015 0640   VLDL 33 11/15/2015 0640   LDLCALC 97 11/15/2015 0640      Wt Readings from Last 3 Encounters:  05/04/22 227 lb 3.2 oz (103.1 kg)  05/01/22 230 lb (104.3 kg)  02/20/22 230 lb (104.3 kg)      Other studies Reviewed: Additional studies/ records that were reviewed today include: ED records Review of the above records demonstrates:  Please see elsewhere in the note.     ASSESSMENT AND PLAN:  CHEST PAIN:     Precordial chest pain is nonanginal.  She has had an extensive work-up.  She is not having any substernal chest discomfort today.  No change in therapy.   HTN: Her blood pressure is not controlled currently but she has been sensitive to medications.  I will creep up on her medications  by increasing her HCTZ to 25 mg daily.  PALPITATIONS:     She had no significant arrhythmias.  She has occasional palpitations we talked about exercise and stress and anxiety management.  LVH:  There was no evidence of this on echo in October.  No further work-up.  Current medicines are reviewed at length with the patient today.  The patient does not have concerns regarding medicines.  The following changes have been made:  As above  Labs/ tests ordered today include:    None    No orders of the defined types were placed in this encounter.    Disposition:   FU with APP in 6 months   Signed, Rollene Rotunda, MD  05/04/2022 12:10 PM    [facility]

## 2022-05-04 ENCOUNTER — Encounter: Payer: Self-pay | Admitting: Cardiology

## 2022-05-04 ENCOUNTER — Ambulatory Visit: Payer: Medicare Other | Attending: Cardiology | Admitting: Cardiology

## 2022-05-04 VITALS — BP 168/82 | HR 99 | Ht 67.0 in | Wt 227.2 lb

## 2022-05-04 DIAGNOSIS — I517 Cardiomegaly: Secondary | ICD-10-CM | POA: Insufficient documentation

## 2022-05-04 DIAGNOSIS — R002 Palpitations: Secondary | ICD-10-CM | POA: Diagnosis not present

## 2022-05-04 DIAGNOSIS — I10 Essential (primary) hypertension: Secondary | ICD-10-CM | POA: Diagnosis not present

## 2022-05-04 DIAGNOSIS — R072 Precordial pain: Secondary | ICD-10-CM | POA: Diagnosis not present

## 2022-05-04 MED ORDER — HYDROCHLOROTHIAZIDE 25 MG PO TABS: 25.0000 mg | ORAL_TABLET | Freq: Every day | ORAL | 3 refills | Status: DC

## 2022-05-04 NOTE — Patient Instructions (Signed)
Medication Instructions:   INCREASE HCTZ TO 25 MG ONCE DAILY= 2 OF THE 12.5 MG TABLETS ONCE DAILY  *If you need a refill on your cardiac medications before your next appointment, please call your pharmacy*   Follow-Up: At [facility], you and your health needs are our priority.  As part of our continuing mission to provide you with exceptional heart care, we have created designated Provider Care Teams.  These Care Teams include your primary Cardiologist (physician) and Advanced Practice Providers (APPs -  Physician Assistants and Nurse Practitioners) who all work together to provide you with the care you need, when you need it.  We recommend signing up for the patient portal called "MyChart".  Sign up information is provided on this After Visit Summary.  MyChart is used to connect with patients for Virtual Visits (Telemedicine).  Patients are able to view lab/test results, encounter notes, upcoming appointments, etc.  Non-urgent messages can be sent to your provider as well.   To learn more about what you can do with MyChart, go to [email or web address].    Your next appointment:   6 month(s)  The format for your next appointment:   In Person  Provider:   ANY APP

## 2022-05-07 ENCOUNTER — Telehealth: Payer: Self-pay | Admitting: Cardiology

## 2022-05-07 NOTE — Telephone Encounter (Signed)
Spoke with patient who reported the following:  Yesterday 10:53pm   163/96  HR 80 6:38 pm   137/99  HR 65 1:15 pm   154/100  HR 86  Today 3:52pm 172/87   HR 81 2:19pm  135/82  HR 84 1:30pm   130/72  HR 66  Patient reports pain/pressure at the base of her skull. She has not done any physical activity today. While on phone, BP 161/85, P 77. Since the increase in her HCTZ, she has been splitting her dose 12.5mg  twice daily. I informed her that she is to take the 25mg  once daily. She has many 12.5mg  tablets and stated she will take two of the 12.5mg  each morning. Recommended that she check her BP 1-2 hours after taking the 25mg  dose. She stated she will call clinic in 1-2 days to report BP/P.

## 2022-05-07 NOTE — Telephone Encounter (Signed)
Pt c/o BP issue: STAT if pt c/o blurred vision, one-sided weakness or slurred speech  1. What are your last 5 BP readings?   Today 3:52pm 172/87   HR 81 2:19pm  135/82  HR 84 1:30pm   130/72  HR 66  Yesterday 10:53pm   163/96  HR 80 6:38 pm   137/99  HR 65 1:15 pm   154/100  HR 86  2. Are you having any other symptoms (ex. Dizziness, headache, blurred vision, passed out)?   Slight pressure at the back of her head  3. What is your BP issue?   Patient stated her blood pressure medication was increased but her BP is not coming down.  Patient stated she recently did an T4 and T3 and Antibody blood test and result showed auto-immune hypothyroid showed positive.  Patient would like to know next steps.

## 2022-05-08 ENCOUNTER — Telehealth: Payer: Self-pay | Admitting: Cardiology

## 2022-05-08 NOTE — Telephone Encounter (Signed)
Patient reported BP from last night at 9:49 pm 140/125, P 91; at 10:31 BP 152/91, P 84. This am 11:15 146/87, P 69, and while on phone BP 141/91, P 73. Patient stated she is under a a lot of stress: fibromyalgia, care broke down, staying with family at present. She will call tomorrow to provide more readings.

## 2022-05-08 NOTE — Telephone Encounter (Signed)
Pt returning nurses call as requested with BP reading. Please advise  146/87 HR 69 - This morning at 11:15 am 140/125 HR 91 - Last night

## 2022-05-09 ENCOUNTER — Telehealth: Payer: Self-pay | Admitting: Cardiology

## 2022-05-09 NOTE — Telephone Encounter (Signed)
Pt c/o BP issue: STAT if pt c/o blurred vision, one-sided weakness or slurred speech  1. What are your last 5 BP readings?  6:15am: 140/90  h74 7:09am: 114/73  hr76  1:14pm:   119/80 hr84 1:32pm:  139/87  hr80  Pt took bp medication (HTZ) at 1:33pm   3:36pm:  139/84  hr83 4:48pm:  147/87  hr78   2. Are you having any other symptoms (ex. Dizziness, headache, blurred vision, passed out)? No   3. What is your BP issue? Pt was told to call back with bp readings

## 2022-05-10 NOTE — Telephone Encounter (Signed)
Pt updated and verbalized understanding  Message sent to scheduling to arrange appointment   Minus Breeding, MD  You 18 hours ago (6:49 PM)    Please arrange follow up in the Pharm D hypertension clinic in one month.  We will review further BP readings at that time.  She should record them on paper and bring them to her appt.

## 2022-06-12 ENCOUNTER — Ambulatory Visit: Payer: Medicare Other | Attending: Internal Medicine | Admitting: Pharmacist

## 2022-06-12 VITALS — BP 139/67 | HR 70

## 2022-06-12 DIAGNOSIS — I517 Cardiomegaly: Secondary | ICD-10-CM | POA: Insufficient documentation

## 2022-06-12 DIAGNOSIS — I10 Essential (primary) hypertension: Secondary | ICD-10-CM | POA: Diagnosis not present

## 2022-06-12 MED ORDER — OLMESARTAN MEDOXOMIL 5 MG PO TABS: 5.0000 mg | ORAL_TABLET | Freq: Every day | ORAL | 5 refills | Status: AC

## 2022-06-12 MED ORDER — BLOOD PRESSURE MONITOR KIT: PACK | 0 refills | Status: AC

## 2022-06-12 NOTE — Patient Instructions (Addendum)
It was nice meeting you today  We would like your blood pressure to be less than 130/80  Please continue your hydrochlorothiazide 25mg  once a day in the morning  We will start a new medication called olmesartan 5mg  once a day  Please continue to monitor your blood pressure at home  Please call or message with any questions  , PharmD, BCACP, CDCES, CPP [address] [place], [address], [place] Phone: [phone], Fax: [phone]
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
## 2022-08-02 ENCOUNTER — Ambulatory Visit: Payer: Medicare Other

## 2023-03-12 ENCOUNTER — Ambulatory Visit
Admission: RE | Admit: 2023-03-12 | Discharge: 2023-03-12 | Disposition: A | Discharge status: Home / Self Care | Payer: Medicare Other | Source: Ambulatory Visit | Attending: Family Medicine | Admitting: Family Medicine

## 2023-03-12 ENCOUNTER — Other Ambulatory Visit: Payer: Self-pay | Admitting: Family Medicine

## 2023-03-12 DIAGNOSIS — R079 Chest pain, unspecified: Secondary | ICD-10-CM

## 2023-06-25 ENCOUNTER — Other Ambulatory Visit: Payer: Self-pay | Admitting: Cardiology

## 2023-07-29 ENCOUNTER — Other Ambulatory Visit: Payer: Self-pay | Admitting: Cardiology

## 2024-01-16 ENCOUNTER — Telehealth: Payer: Self-pay | Admitting: Cardiology

## 2024-01-16 NOTE — Telephone Encounter (Signed)
 Spoke with pt reports CP started last night after encounter with neighbors.  Per pt feels like a gripping /pinching feeling to chest.  Took NTG X1 at 7:30 am, 7:42 am and 8:38 am.  Reports NTG is at least 73 years old.  NTG improved CP some but not all the way.  Has been praying for relief.  Reports left arm/ hand pain/numbness, right shoulder pain, neck stiffness and SOB.  Is currently having CP.  No recent BP/HR.  Advised pt of need for ED evaluation. Pt would like to be seen in office.  Advised pt active CP needs ED care not office.  Pt expresses will go to ED.

## 2024-01-16 NOTE — Telephone Encounter (Signed)
 Pt c/o of Chest Pain: STAT if active (IN THIS MOMENT) CP, including tightness, pressure, jaw pain, shoulder/upper arm/back pain, SOB, nausea, and vomiting.  1. Are you having CP right now (tightness, pressure, or discomfort)? Yes  2. Are you experiencing any other symptoms (ex. SOB, nausea, vomiting, sweating)? No   3. How long have you been experiencing CP? Since last night   4. Is your CP continuous or coming and going? Continuous   5. Have you taken Nitroglycerin ? Yes   6. If CP returns before callback, please consider calling 911. ?

## 2024-01-17 ENCOUNTER — Telehealth: Payer: Self-pay

## 2024-01-17 ENCOUNTER — Encounter (HOSPITAL_COMMUNITY): Payer: Self-pay

## 2024-01-17 ENCOUNTER — Emergency Department (HOSPITAL_COMMUNITY)

## 2024-01-17 ENCOUNTER — Emergency Department (HOSPITAL_COMMUNITY)
Admission: EM | Admit: 2024-01-17 | Discharge: 2024-01-17 | Disposition: A | Discharge status: Home / Self Care | Attending: Emergency Medicine | Admitting: Emergency Medicine

## 2024-01-17 ENCOUNTER — Other Ambulatory Visit: Payer: Self-pay

## 2024-01-17 DIAGNOSIS — R079 Chest pain, unspecified: Secondary | ICD-10-CM | POA: Diagnosis present

## 2024-01-17 DIAGNOSIS — R0789 Other chest pain: Secondary | ICD-10-CM | POA: Diagnosis not present

## 2024-01-17 LAB — CBC WITH DIFFERENTIAL/PLATELET
Abs Immature Granulocytes: 0.04 10*3/uL (ref 0.00–0.07)
Basophils Absolute: 0 10*3/uL (ref 0.0–0.1)
Basophils Relative: 1 %
Eosinophils Absolute: 0 10*3/uL (ref 0.0–0.5)
Eosinophils Relative: 1 %
HCT: 43.3 % (ref 36.0–46.0)
Hemoglobin: 14.3 g/dL (ref 12.0–15.0)
Immature Granulocytes: 1 %
Lymphocytes Relative: 31 %
Lymphs Abs: 2.4 10*3/uL (ref 0.7–4.0)
MCH: 31.9 pg (ref 26.0–34.0)
MCHC: 33 g/dL (ref 30.0–36.0)
MCV: 96.7 fL (ref 80.0–100.0)
Monocytes Absolute: 0.7 10*3/uL (ref 0.1–1.0)
Monocytes Relative: 9 %
Neutro Abs: 4.5 10*3/uL (ref 1.7–7.7)
Neutrophils Relative %: 57 %
Platelets: 162 10*3/uL (ref 150–400)
RBC: 4.48 MIL/uL (ref 3.87–5.11)
RDW: 12.4 % (ref 11.5–15.5)
WBC: 7.7 10*3/uL (ref 4.0–10.5)
nRBC: 0 % (ref 0.0–0.2)

## 2024-01-17 LAB — BASIC METABOLIC PANEL WITH GFR
Anion gap: 11 (ref 5–15)
BUN: 11 mg/dL (ref 8–23)
CO2: 28 mmol/L (ref 22–32)
Calcium: 8.9 mg/dL (ref 8.9–10.3)
Chloride: 99 mmol/L (ref 98–111)
Creatinine, Ser: 0.75 mg/dL (ref 0.44–1.00)
GFR, Estimated: >60 mL/min (ref >60)
Glucose, Bld: 114 mg/dL — ABNORMAL HIGH (ref 70–99)
Potassium: 3.3 mmol/L — ABNORMAL LOW (ref 3.5–5.1)
Sodium: 138 mmol/L (ref 135–145)

## 2024-01-17 LAB — TROPONIN I (HIGH SENSITIVITY): Troponin I (High Sensitivity): 5 ng/L (ref <18)

## 2024-01-17 LAB — CBG MONITORING, ED: Glucose-Capillary: 120 mg/dL — ABNORMAL HIGH (ref 70–99)

## 2024-01-17 NOTE — ED Triage Notes (Signed)
 Patient presents to ER with chest pain radiating to back and neck for 7 days. Patient took nitroglycerin  at home yesterday for chest pain with no relief. Patient had recent reaction to amoxicillin causing blisters and rash in mouth.

## 2024-01-17 NOTE — Telephone Encounter (Signed)
 Patient walked in from being in the ED. Informed patient that her chart was reviewed and we saw the ED note. Informed patient that ED would not have let her go if something was concerning or urgent. Scheduled her with the next available provider, DOD on Tuesday. Patient is concerned about her last echo. Informed patient that she should request for an up to dated echo when she has her appointment. Since patient's potassium was low in ED labs, encouraged patient to increase her potassium rich foods. Patient was agreeable to plan and will come back on Tuesday.

## 2024-01-17 NOTE — ED Provider Notes (Signed)
 [place] EMERGENCY DEPARTMENT AT [facility] Provider Note   CSN: [id number] Arrival date & time: 01/17/24  1224     Patient presents with: Chest Pain   Tamara Chambers is a 73 y.o. female.   73 year old female with prior medical history as detailed below presents for evaluation.  Patient complains of atypical chest discomfort.  She reports constant discomfort to the mid chest.  This has been present for 7 days.  It does not go away completely at any time.  She denies associated nausea, vomiting, shortness of breath.  She denies fevers.  She is somewhat scattered when asked specific questions about her prior cardiac history.  She is known to Dr. Lavonne Prairie.  She was last evaluated by Dr. Lavonne Prairie in his office in 2023.   See excerpt from Dr. Atlas Lea last office note below:  73 y.o. female who presents for evaluation of chest pain.  She has had no prior cardiac diagnosis although there was a catheterization that I see from 2008 with normal coronaries.  She had syncope in 2017.  I looked at these results and she had a negative work-up including an echocardiogram with an EF of 55% and no significant wall motion abnormalities.   She had chest pain in May.  This was thought to not be angina but she had continued pain .  She had a CTA which demonstrated minimal calcium and minimal non obstructive coronary plaque.   She had palpitations in Nov 2022 and she had no evidence of arrhythmia on a monitor.    Echo to look at a previous diagnosis of LVH did not confirm LVH.  The history is provided by the patient and medical records.       Prior to Admission medications   Medication Sig Start Date End Date Taking? Authorizing Provider  albuterol  (PROVENTIL  HFA;VENTOLIN  HFA) 108 (90 Base) MCG/ACT inhaler Inhale 1-2 puffs into the lungs every 6 (six) hours as needed for wheezing or shortness of breath. 10/09/18   Eldon Greenland, MD  Blood Pressure Monitor KIT Use to check blood  pressure at least once daily 06/12/22   Eilleen Grates, MD  docusate sodium  (COLACE) 100 MG capsule Take 100 mg by mouth 2 (two) times daily.    [provider]  hydrochlorothiazide  (HYDRODIURIL ) 25 MG tablet TAKE 1 TABLET (25 MG TOTAL) BY MOUTH DAILY. CALL TO SCHEDULE OFFICE VISIT FOR REFILLS 07/29/23   Eilleen Grates, MD  nitroGLYCERIN  (NITROSTAT ) 0.4 MG SL tablet Place 0.4 mg under the tongue as directed.    [provider]  olmesartan  (BENICAR ) 5 MG tablet Take 1 tablet (5 mg total) by mouth daily. 06/12/22   Eilleen Grates, MD  oxyCODONE -acetaminophen  (PERCOCET/ROXICET) 5-325 MG tablet Take 1 tablet by mouth 3 (three) times daily as needed. 06/07/22   [provider]  pantoprazole  (PROTONIX ) 20 MG tablet Take 2 tablets (40 mg total) by mouth daily for 14 days. 02/20/22 03/06/22  Scarlette Currier, MD  phenytoin (DILANTIN) 100 MG ER capsule Take 100 mg by mouth 3 (three) times daily.    [provider]  polyethylene glycol (MIRALAX  / GLYCOLAX ) packet Take 17 g by mouth daily.    [provider]    Allergies: Aripiprazole, Divalproex sodium, Tramadol , Amlodipine, Duloxetine, Codeine, Meloxicam, Neurontin [gabapentin], and Wellbutrin [bupropion hcl]    Review of Systems  All other systems reviewed and are negative.   Updated Vital Signs BP (!) 162/91   Pulse 83   Temp 98 F (36.7 C) (Oral)  Resp 20   Ht 5' 7 (1.702 m)   Wt 103 kg   SpO2 98%   BMI 35.56 kg/m   Physical Exam Vitals and nursing note reviewed.  Constitutional:      General: She is not in acute distress.    Appearance: Normal appearance. She is well-developed.  HENT:     Head: Normocephalic and atraumatic.   Eyes:     Conjunctiva/sclera: Conjunctivae normal.     Pupils: Pupils are equal, round, and reactive to light.    Cardiovascular:     Rate and Rhythm: Normal rate and regular rhythm.     Heart sounds: Normal heart sounds.  Pulmonary:     Effort: Pulmonary  effort is normal. No respiratory distress.     Breath sounds: Normal breath sounds.  Abdominal:     General: There is no distension.     Palpations: Abdomen is soft.     Tenderness: There is no abdominal tenderness.   Musculoskeletal:        General: No deformity. Normal range of motion.     Cervical back: Normal range of motion and neck supple.   Skin:    General: Skin is warm and dry.   Neurological:     General: No focal deficit present.     Mental Status: She is alert and oriented to person, place, and time.     (all labs ordered are listed, but only abnormal results are displayed) Labs Reviewed  BASIC METABOLIC PANEL WITH GFR  CBC WITH DIFFERENTIAL/PLATELET  CBG MONITORING, ED  TROPONIN I (HIGH SENSITIVITY)    EKG: EKG Interpretation Date/Time:  Friday January 17 2024 12:32:23 EDT Ventricular Rate:  80 PR Interval:  160 QRS Duration:  108 QT Interval:  375 QTC Calculation: 433 R Axis:   61  Text Interpretation: Sinus rhythm Confirmed by Angela Kell [phone]) on 01/17/2024 12:41:22 PM  Radiology: No results found.   Procedures   Medications Ordered in the ED - No data to display                                  Medical Decision Making   Medical Screen Complete  This patient presented to the ED with complaint of chest pain.  This complaint involves an extensive number of treatment options. The initial differential diagnosis includes, but is not limited to, ACS, muscular pain, GERD, anxiety, metabolic abnormality, etc.  This presentation is: Acute, Self-Limited, Previously Undiagnosed, Uncertain Prognosis, Complicated, Systemic Symptoms, and Threat to Life/Bodily Function  Patient presents with highly atypical chest discomfort.  Symptoms been ongoing for the last 1 to 2 weeks.  She reports that the symptoms present every day.  She denies current chest pain.  Patient is without documented history of significant cardiac disease.  Obtained EKG is without  acute ischemia.  Screening labs are without significant abnormality.  Patient is reassured by workup findings.  She desires discharge.  Patient has established prior cardiology care with Dr. Lavonne Prairie.  Patient understands need to follow-up closely with cardiology.  Importance of close follow-up was stressed.  Strict return precautions given and understood.   Additional history obtained: External records from outside sources obtained and reviewed including prior ED visits and prior Inpatient records.    Problem List / ED Course:  Atypical chest pain   Reevaluation:  After the interventions noted above, I reevaluated the patient and found that they have: resolved  Disposition:  After consideration of the diagnostic results and the patients response to treatment, I feel that the patent would benefit from close outpatient follow-up.       Final diagnoses:  Atypical chest pain    ED Discharge Orders     None          Burnette Carte, MD 01/17/24 1459

## 2024-01-17 NOTE — ED Provider Triage Note (Cosign Needed)
 Emergency Medicine Provider Triage Evaluation Note  RENITA BROCKS , a 73 y.o. female  was evaluated in triage.  Pt complains of substernal chest discomfort that has been present for multiple months, has used nitroglycerin  multiple times over the last 2 weeks without improvement in her symptoms.  Most notably she has a previous medical history of of hyperlipidemia, LVH, type 2 diabetes, and partial symptomatic epilepsy.  Has multiple concerns ranging from a enlargement of the right clavicle to concern for neck masses.  Review of Systems  Positive: Substernal chest pain, radiation to the left arm, radiation to right arm Negative: Nausea, vomiting, fatigue  Physical Exam  BP (!) 162/91   Pulse 83   Temp 98 F (36.7 C) (Oral)   Resp 20   Ht 5' 7 (1.702 m)   Wt 103 kg   SpO2 98%   BMI 35.56 kg/m  Gen:   Awake, no distress   Resp:  Normal effort  MSK:   Moves extremities without difficulty  Other:  No thyromegaly appreciated, there is mild elevation of the right clavicle noted at the junction of the clavicle and manubrium.  No tenderness noted to the area.  Pulmonary auscultation is unremarkable with normal lung sounds full air entry into all lung fields.  Cardiac auscultation is unremarkable.  Medical Decision Making  Medically screening exam initiated at 12:40 PM.  Appropriate orders placed.  LAYALI FREUND was informed that the remainder of the evaluation will be completed by another provider, this initial triage assessment does not replace that evaluation, and the importance of remaining in the ED until their evaluation is complete.  Based on presentation and patient history, will begin workup for cardiac rule out, orders placed for EKG, CBC, BMP, serial troponins, and chest x-ray.   Juanetta Nordmann, [place] 01/17/24 1243

## 2024-01-17 NOTE — Discharge Instructions (Signed)
 Return for any problem.  ?

## 2024-01-20 NOTE — Progress Notes (Unsigned)
  Cardiology Office Note   Date:  01/21/2024  ID:  Tamara, Chambers 07-Jan-1951, MRN [id number] PCP: Loring Tanda Mae, MD  [facility] Providers Cardiologist:  Hochrein   January 21, 2024 Tamara Chambers is a 73 y.o. female with hx of chest pain She was seen by Dr. Lavona in Oct. 2023 She was diagnosed with noncardiac CP  She had an extensive work up   Coronary CTA showed a cAC score of 22.3 (61st percentile for age / sex matched controls ) Mild , nonobstructive CAD   She was seen in the ER on June 20 for episodes of CP  The pain was constant for 7 days   She has fibromyalgia Does not exercise  Does not sleep well, Advised her to continue to work on sleeping better and continue to exercise.  No longer on this will help her fibromyalgia.  Has been taking some vitamins and has been feeling better     Troponins were negative      Hx of HTN, Palpitations   ROS:  Studies Reviewed       Risk Assessment/Calculations     Physical Exam VS:  BP (!) 144/70   Pulse 88   Ht 5' 7 (1.702 m)   Wt 231 lb 12.8 oz (105.1 kg)   SpO2 95%   BMI 36.31 kg/m        Wt Readings from Last 3 Encounters:  01/21/24 231 lb 12.8 oz (105.1 kg)  01/17/24 227 lb 1.2 oz (103 kg)  05/04/22 227 lb 3.2 oz (103.1 kg)    GEN: moderately obese, middle age female  in no acute distress NECK: No JVD; No carotid bruits CARDIAC: RRR, no murmurs, rubs, gallops RESPIRATORY:  Clear to auscultation without rales, wheezing or rhonchi  ABDOMEN: Soft, non-tender, non-distended EXTREMITIES:  No edema; No deformity   ASSESSMENT AND PLAN 1.  Noncardiac chest pain: [name] presents for further evaluation of what appears to be noncardiac chest pain.  She has had episodes of chest pain in the past and has had a very thorough workup including an unremarkable coronary CT angiogram and echocardiogram.  She presented to the emergency room with 1 week of nonstop chest pain.  Despite this, her  troponin levels were negative.  I reassured her that her workup has been fairly complete and that her symptoms do not sound like coronary artery disease.  She will keep her appointment with Dr. Lavona on July 31.       Dispo: keep appt on July 31 with Dr. Lavona .  Signed, Aleene Passe, MD

## 2024-01-21 ENCOUNTER — Encounter: Payer: Self-pay | Admitting: Cardiovascular Disease

## 2024-01-21 ENCOUNTER — Ambulatory Visit: Attending: Cardiovascular Disease | Admitting: Cardiovascular Disease

## 2024-01-21 VITALS — BP 144/70 | HR 88 | Ht 67.0 in | Wt 231.8 lb

## 2024-01-21 DIAGNOSIS — R079 Chest pain, unspecified: Secondary | ICD-10-CM | POA: Diagnosis not present

## 2024-01-21 DIAGNOSIS — M797 Fibromyalgia: Secondary | ICD-10-CM | POA: Diagnosis not present

## 2024-01-21 NOTE — Patient Instructions (Addendum)
 Follow-Up: At [facility], you and your health needs are our priority.  As part of our continuing mission to provide you with exceptional heart care, our providers are all part of one team.  This team includes your primary Cardiologist (physician) and Advanced Practice Providers or APPs (Physician Assistants and Nurse Practitioners) who all work together to provide you with the care you need, when you need it.  Your next appointment:   AS SCHEDULED  Provider:   Damien Braver  We recommend signing up for the patient portal called MyChart.  Sign up information is provided on this After Visit Summary.  MyChart is used to connect with patients for Virtual Visits (Telemedicine).  Patients are able to view lab/test results, encounter notes, upcoming appointments, etc.  Non-urgent messages can be sent to your provider as well.   To learn more about what you can do with MyChart, go to [email or web address].

## 2024-02-22 ENCOUNTER — Emergency Department (HOSPITAL_COMMUNITY): Admission: EM | Admit: 2024-02-22 | Discharge: 2024-02-22 | Disposition: A | Discharge status: Home / Self Care

## 2024-02-22 ENCOUNTER — Other Ambulatory Visit: Payer: Self-pay

## 2024-02-22 ENCOUNTER — Encounter (HOSPITAL_COMMUNITY): Payer: Self-pay | Admitting: Emergency Medicine

## 2024-02-22 DIAGNOSIS — S1086XA Insect bite of other specified part of neck, initial encounter: Secondary | ICD-10-CM | POA: Insufficient documentation

## 2024-02-22 DIAGNOSIS — R21 Rash and other nonspecific skin eruption: Secondary | ICD-10-CM

## 2024-02-22 DIAGNOSIS — S1096XA Insect bite of unspecified part of neck, initial encounter: Secondary | ICD-10-CM

## 2024-02-22 DIAGNOSIS — W57XXXA Bitten or stung by nonvenomous insect and other nonvenomous arthropods, initial encounter: Secondary | ICD-10-CM | POA: Insufficient documentation

## 2024-02-22 LAB — CBC WITH DIFFERENTIAL/PLATELET
Abs Immature Granulocytes: 0.02 K/uL (ref 0.00–0.07)
Basophils Absolute: 0 K/uL (ref 0.0–0.1)
Basophils Relative: 0 %
Eosinophils Absolute: 0.1 K/uL (ref 0.0–0.5)
Eosinophils Relative: 1 %
HCT: 40.9 % (ref 36.0–46.0)
Hemoglobin: 13.7 g/dL (ref 12.0–15.0)
Immature Granulocytes: 0 %
Lymphocytes Relative: 38 %
Lymphs Abs: 3.4 K/uL (ref 0.7–4.0)
MCH: 31.8 pg (ref 26.0–34.0)
MCHC: 33.5 g/dL (ref 30.0–36.0)
MCV: 94.9 fL (ref 80.0–100.0)
Monocytes Absolute: 0.6 K/uL (ref 0.1–1.0)
Monocytes Relative: 7 %
Neutro Abs: 4.8 K/uL (ref 1.7–7.7)
Neutrophils Relative %: 54 %
Platelets: 170 K/uL (ref 150–400)
RBC: 4.31 MIL/uL (ref 3.87–5.11)
RDW: 12.6 % (ref 11.5–15.5)
WBC: 9 K/uL (ref 4.0–10.5)
nRBC: 0 % (ref 0.0–0.2)

## 2024-02-22 LAB — COMPREHENSIVE METABOLIC PANEL WITH GFR
ALT: 22 U/L (ref 0–44)
AST: 28 U/L (ref 15–41)
Albumin: 4.3 g/dL (ref 3.5–5.0)
Alkaline Phosphatase: 126 U/L (ref 38–126)
Anion gap: 11 (ref 5–15)
BUN: 19 mg/dL (ref 8–23)
CO2: 25 mmol/L (ref 22–32)
Calcium: 9.1 mg/dL (ref 8.9–10.3)
Chloride: 100 mmol/L (ref 98–111)
Creatinine, Ser: 0.77 mg/dL (ref 0.44–1.00)
GFR, Estimated: >60 mL/min (ref >60)
Glucose, Bld: 109 mg/dL — ABNORMAL HIGH (ref 70–99)
Potassium: 3.4 mmol/L — ABNORMAL LOW (ref 3.5–5.1)
Sodium: 136 mmol/L (ref 135–145)
Total Bilirubin: 0.9 mg/dL (ref 0.0–1.2)
Total Protein: 8 g/dL (ref 6.5–8.1)

## 2024-02-22 MED ORDER — DOXYCYCLINE HYCLATE 100 MG PO CAPS: 100.0000 mg | ORAL_CAPSULE | Freq: Two times a day (BID) | ORAL | 0 refills | Status: AC

## 2024-02-22 NOTE — ED Provider Notes (Signed)
 [place] EMERGENCY DEPARTMENT AT [facility] Provider Note   CSN: [id number] Arrival date & time: 02/22/24  0501     Patient presents with: Insect Bite   Tamara Chambers is a 73 y.o. female.   HPI    Presents  because of concern for insect bite.  Patient states that she was bitten the back of the neck by a tick approximately 2 to 3 weeks ago.  Not sure how it had been attached.  Since then, because of having episodic chills.  Endorses fatigue.  No neck pain.  No headaches.  No vision changes.  No chest pain or shortness of breath.  Patient states she just feels fatigued in nature.  No exertional chest pain.  No loose stools.  No nausea vomit diarrhea.  She for like she noticed the rash over the past couple days the back of her neck.   Previous medical history reviewed : Patient was seen on June 20.  Was seen because of concern for chest pain.  Had negative workup at that time.   Prior to Admission medications   Medication Sig Start Date End Date Taking? Authorizing Provider  doxycycline  (VIBRAMYCIN ) 100 MG capsule Take 1 capsule (100 mg total) by mouth 2 (two) times daily for 15 days. 02/22/24 03/08/24 Yes Simon Lavonia SAILOR, MD  albuterol  (PROVENTIL  HFA;VENTOLIN  HFA) 108 (90 Base) MCG/ACT inhaler Inhale 1-2 puffs into the lungs every 6 (six) hours as needed for wheezing or shortness of breath. 10/09/18   Midge Golas, MD  Blood Pressure Monitor KIT Use to check blood pressure at least once daily 06/12/22   Lavona Agent, MD  docusate sodium  (COLACE) 100 MG capsule Take 100 mg by mouth 2 (two) times daily.    [provider]  hydrochlorothiazide  (HYDRODIURIL ) 25 MG tablet TAKE 1 TABLET (25 MG TOTAL) BY MOUTH DAILY. CALL TO SCHEDULE OFFICE VISIT FOR REFILLS 07/29/23   Lavona Agent, MD  lamoTRIgine (LAMICTAL) 100 MG tablet Take 100 mg by mouth 2 (two) times daily.    [provider]  nitroGLYCERIN  (NITROSTAT ) 0.4 MG SL tablet Place 0.4 mg under the  tongue as directed.    [provider]  olmesartan  (BENICAR ) 5 MG tablet Take 1 tablet (5 mg total) by mouth daily. Patient not taking: Reported on 01/21/2024 06/12/22   Lavona Agent, MD  oxyCODONE -acetaminophen  (PERCOCET/ROXICET) 5-325 MG tablet Take 1 tablet by mouth 3 (three) times daily as needed. 06/07/22   [provider]  pantoprazole  (PROTONIX ) 20 MG tablet Take 2 tablets (40 mg total) by mouth daily for 14 days. Patient not taking: Reported on 01/21/2024 02/20/22 03/06/22  Dreama Longs, MD  phenytoin (DILANTIN) 100 MG ER capsule Take 100 mg by mouth 3 (three) times daily.    [provider]  polyethylene glycol (MIRALAX  / GLYCOLAX ) packet Take 17 g by mouth daily.    [provider]    Allergies: Aripiprazole, Divalproex sodium, Tramadol , Amlodipine, Duloxetine, Codeine, Meloxicam, Neurontin [gabapentin], and Wellbutrin [bupropion hcl]    Review of Systems  Constitutional:  Negative for chills and fever.  HENT:  Negative for ear pain and sore throat.   Eyes:  Negative for pain and visual disturbance.  Respiratory:  Negative for cough and shortness of breath.   Cardiovascular:  Negative for chest pain and palpitations.  Gastrointestinal:  Negative for abdominal pain and vomiting.  Genitourinary:  Negative for dysuria and hematuria.  Musculoskeletal:  Negative for arthralgias and back pain.  Skin:  Negative for color  change and rash.  Neurological:  Negative for seizures and syncope.  All other systems reviewed and are negative.   Updated Vital Signs BP (!) 170/76 (BP Location: Right Arm)   Pulse 81   Temp 98 F (36.7 C) (Oral)   Resp 18   Ht 5' 7 (1.702 m)   Wt 104.3 kg   SpO2 95%   BMI 36.02 kg/m   Physical Exam Vitals and nursing note reviewed.  Constitutional:      General: She is not in acute distress.    Appearance: She is well-developed.  HENT:     Head: Normocephalic and atraumatic.  Eyes:     Conjunctiva/sclera:  Conjunctivae normal.  Cardiovascular:     Rate and Rhythm: Normal rate and regular rhythm.     Heart sounds: No murmur heard. Pulmonary:     Effort: Pulmonary effort is normal. No respiratory distress.     Breath sounds: Normal breath sounds.  Abdominal:     Palpations: Abdomen is soft.     Tenderness: There is no abdominal tenderness.  Musculoskeletal:        General: No swelling.     Cervical back: Neck supple.  Skin:    General: Skin is warm and dry.     Capillary Refill: Capillary refill takes less than 2 seconds.      Neurological:     Mental Status: She is alert.  Psychiatric:        Mood and Affect: Mood normal.     (all labs ordered are listed, but only abnormal results are displayed) Labs Reviewed  COMPREHENSIVE METABOLIC PANEL WITH GFR - Abnormal; Notable for the following components:      Result Value   Potassium 3.4 (*)    Glucose, Bld 109 (*)    All other components within normal limits  CBC WITH DIFFERENTIAL/PLATELET  LYME DISEASE SEROLOGY W/REFLEX    EKG: EKG Interpretation Date/Time:  Saturday February 22 2024 08:36:43 EDT Ventricular Rate:  57 PR Interval:  161 QRS Duration:  110 QT Interval:  409 QTC Calculation: 399 R Axis:   56  Text Interpretation: Sinus rhythm Confirmed by Simon Rea [phone]) on 02/22/2024 8:54:15 AM  Radiology: No results found.   Procedures   Medications Ordered in the ED - No data to display                                  Medical Decision Making Amount and/or Complexity of Data Reviewed Labs: ordered.  Risk Prescription drug management.   Previous medical history reviewed : Patient was seen on June 20.  Was seen because of concern for chest pain.  Had negative workup at that time.       Presents  because of concern for insect bite.  Patient states that she was bitten the back of the neck by a tick approximately 2 to 3 weeks ago.  Not sure how it had been attached.  Since then, because of having episodic  chills.  Endorses fatigue.  No neck pain.  No headaches.  No vision changes.  No chest pain or shortness of breath.  Patient states she just feels fatigued in nature.  No exertional chest pain.  No loose stools.  No nausea vomit diarrhea.  She for like she noticed the rash over the past couple days the back of her neck.   Previous medical history reviewed : Patient was seen on June  20.  Was seen because of concern for chest pain.  Had negative workup at that time.    On exam, patient Impeklo stable.  ANO x 3 GCS 15.  No pain with neck flexion.  Negative Kernig's Brudzinski's.  No concerns for meningitis or encephalitis.  ANO x 3 with a GCS of 15.   Obtain basic laboratory workup as well as EKG.  KG showed sinus rhythm.  No arrhythmia.  No third-degree heart block or other heart block that would be concerning for any kind of advanced Lyme disease.  Laboratory workup unremarkable.  No LFT derangements that might be seen with other tickborne illnesses.  I did obtain a Lyme disease serology with reflex.  This will take a couple days to come back.  Given my high degree suspicion for possible Lyme disease, I did start patient on doxycycline  for the next 15 days.  She can follow-up with her primary care physician following this.   Strict return precautions.   Final diagnoses:  Tick bite of neck, initial encounter  Rash    ED Discharge Orders          Ordered    doxycycline  (VIBRAMYCIN ) 100 MG capsule  2 times daily        02/22/24 0926               Simon Lavonia SAILOR, MD 02/22/24 [phone]

## 2024-02-22 NOTE — ED Triage Notes (Signed)
  Patient comes in stating she pulled a tick off of her neck about 2 weeks ago.  Patient states she did not think much of it but noticed a rash over the past few days around where the bite was.  Patient concerned for possible tick borne illness.  Pain 2/10, soreness.  Has fibromyalgia so is unsure if she has joint pain.

## 2024-02-22 NOTE — Discharge Instructions (Signed)
 Given your rash in the setting of this tick bite, I will cover you for Lyme disease.  Will start you on doxycycline .  Take this as prescribed.  Follow-up with your primary care physician make sure this resolves.  If he have any kind of worsening neck pain, headaches, fever, please come back to the ED for further evaluation.  Did send off laboratory testing to test for Lyme but this will take some time to come back.

## 2024-02-24 LAB — LYME DISEASE SEROLOGY W/REFLEX: Lyme Total Antibody EIA: NEGATIVE

## 2024-02-27 ENCOUNTER — Ambulatory Visit: Admitting: Nurse Practitioner

## 2024-03-27 ENCOUNTER — Ambulatory Visit: Attending: Nurse Practitioner | Admitting: Nurse Practitioner

## 2024-03-27 NOTE — Progress Notes (Deleted)
 Office Visit    Patient Name: Tamara Chambers Date of Encounter: 03/27/2024  Primary Care Provider:  Loring Tanda Mae, MD Primary Cardiologist:  Lynwood Schilling, MD  Chief Complaint    73 year old female with a history of mild nonobstructive CAD, hypertension, hyperlipidemia, type 2 diabetes, seizure disorder, hypothyroidism, asthma, fibromyalgia, and GERD who presents for follow-up related to CAD and chest pain.  Past Medical History    Past Medical History:  Diagnosis Date   Anemia    Anxiety    Arthritis    Asthma    Diabetes mellitus    Emphysema of lung (HCC)    Fibromyalgia    GERD (gastroesophageal reflux disease)    Hyperlipidemia    Hypertension    Neuromuscular disorder (HCC)    Polio    Seizures (HCC)    Thyroid disease    Past Surgical History:  Procedure Laterality Date   ABDOMINAL HYSTERECTOMY  1992   partial   APPENDECTOMY     BREAST EXCISIONAL BIOPSY Bilateral 01/19/1998   CESAREAN SECTION  1984   CHOLECYSTECTOMY N/A 09/13/2015   Procedure: LAPAROSCOPIC CHOLECYSTECTOMY;  Surgeon: Debby Shipper, MD;  Location: [facility] OR;  Service: General;  Laterality: N/A;   ERCP N/A 09/12/2015   Procedure: ENDOSCOPIC RETROGRADE CHOLANGIOPANCREATOGRAPHY (ERCP);  Surgeon: Norleen Hint, MD;  Location: [facility] ENDOSCOPY;  Service: Endoscopy;  Laterality: N/A;   FEMUR DISTAL LOCKING SCREW INSERTION  2004   left foot   HIATAL HERNIA REPAIR     2012   LAPAROSCOPIC NISSEN FUNDOPLICATION  05/15/2011   lap nissen and niatal hernia    OOPHORECTOMY  2004   TONSILLECTOMY AND ADENOIDECTOMY  1968    Allergies  Allergies  Allergen Reactions   Aripiprazole Hives   Divalproex Sodium Anaphylaxis   Tramadol  Other (See Comments)    Seizures.   Amlodipine Swelling    Tongue, lips   Duloxetine     Other reaction(s): Delusions (intolerance)   Codeine Nausea And Vomiting   Meloxicam Rash and Swelling   Neurontin [Gabapentin] Other (See Comments)    Cannot remember reaction.    Wellbutrin [Bupropion Hcl] Other (See Comments)    seizure     Labs/Other Studies Reviewed    The following studies were reviewed today: *** Cardiac Studies & Procedures   ______________________________________________________________________________________________     ECHOCARDIOGRAM  ECHOCARDIOGRAM COMPLETE 05/19/2021  Narrative ECHOCARDIOGRAM REPORT    Patient Name:   Tamara Chambers Date of Exam: 05/19/2021 Medical Rec #:  [id number]        Height:       67.0 in Accession #:    [id number]       Weight:       224.0 lb Date of Birth:  Dec 15, 1950       BSA:          2.122 m Patient Age:    77 years         BP:           140/60 mmHg Patient Gender: F                HR:           58 bpm. Exam Location:  [address]  Procedure: 3D Echo, 2D Echo, Cardiac Doppler and Color Doppler  Indications:    R07.9 Chest Pain  History:        Patient has prior history of Echocardiogram examinations, most recent 11/14/2015. Signs/Symptoms:Chest Pain and Syncope; Risk Factors:Family History of Coronary Artery Disease,  Diabetes, Hypertension, Dyslipidemia and Former Smoker. Palpitations, Emphysema, Left Ventricular Hypertrophy.  Sonographer:    Heather Hawks RDCS Referring Phys: LYNWOOD SCHILLING  IMPRESSIONS   1. Left ventricular ejection fraction by 3D volume is 64 %. The left ventricle has normal function. The left ventricle has no regional wall motion abnormalities. Left ventricular diastolic parameters are consistent with Grade I diastolic dysfunction (impaired relaxation). 2. Right ventricular systolic function is normal. The right ventricular size is normal. There is normal pulmonary artery systolic pressure. 3. The mitral valve is normal in structure. Trivial mitral valve regurgitation. No evidence of mitral stenosis. 4. The aortic valve is tricuspid. Aortic valve regurgitation is not visualized. No aortic stenosis is present. 5. The inferior vena cava is normal in size  with greater than 50% respiratory variability, suggesting right atrial pressure of 3 mmHg.  FINDINGS Left Ventricle: Left ventricular ejection fraction by 3D volume is 64 %. The left ventricle has normal function. The left ventricle has no regional wall motion abnormalities. The left ventricular internal cavity size was normal in size. There is no left ventricular hypertrophy. Left ventricular diastolic parameters are consistent with Grade I diastolic dysfunction (impaired relaxation).  Right Ventricle: The right ventricular size is normal. No increase in right ventricular wall thickness. Right ventricular systolic function is normal. There is normal pulmonary artery systolic pressure. The tricuspid regurgitant velocity is 2.49 m/s, and with an assumed right atrial pressure of 3 mmHg, the estimated right ventricular systolic pressure is 27.8 mmHg.  Left Atrium: Left atrial size was normal in size.  Right Atrium: Right atrial size was normal in size.  Pericardium: There is no evidence of pericardial effusion.  Mitral Valve: The mitral valve is normal in structure. Trivial mitral valve regurgitation. No evidence of mitral valve stenosis.  Tricuspid Valve: The tricuspid valve is normal in structure. Tricuspid valve regurgitation is mild . No evidence of tricuspid stenosis.  Aortic Valve: The aortic valve is tricuspid. Aortic valve regurgitation is not visualized. No aortic stenosis is present.  Pulmonic Valve: The pulmonic valve was normal in structure. Pulmonic valve regurgitation is not visualized. No evidence of pulmonic stenosis.  Aorta: The aortic root is normal in size and structure.  Venous: The inferior vena cava is normal in size with greater than 50% respiratory variability, suggesting right atrial pressure of 3 mmHg.  IAS/Shunts: No atrial level shunt detected by color flow Doppler.   LEFT VENTRICLE PLAX 2D LVIDd:         4.60 cm         Diastology LVIDs:         2.70 cm          LV e' medial:    10.40 cm/s LV PW:         1.00 cm         LV E/e' medial:  8.9 LV IVS:        0.95 cm         LV e' lateral:   10.40 cm/s LVOT diam:     2.30 cm         LV E/e' lateral: 8.9 LV SV:         119 LV SV Index:   56 LVOT Area:     4.15 cm        3D Volume EF LV 3D EF:    Left ventricul ar ejection fraction by 3D volume is 64 %.  3D Volume EF: 3D EF:  64 % LV EDV:       142 ml LV ESV:       51 ml LV SV:        90 ml  RIGHT VENTRICLE RV S prime:     12.70 cm/s TAPSE (M-mode): 2.3 cm  LEFT ATRIUM           Index        RIGHT ATRIUM           Index LA diam:      4.60 cm 2.17 cm/m   RA Area:     11.90 cm LA Vol (A4C): 49.6 ml 23.37 ml/m  RA Volume:   25.10 ml  11.83 ml/m AORTIC VALVE LVOT Vmax:   140.50 cm/s LVOT Vmean:  85.450 cm/s LVOT VTI:    0.286 m  AORTA Ao Root diam: 3.40 cm Ao Asc diam:  3.55 cm  MITRAL VALVE               TRICUSPID VALVE MV Area (PHT)  cm         TR Peak grad:   24.8 mmHg MV Decel Time: 177 msec    TR Vmax:        249.00 cm/s MV E velocity: 92.50 cm/s MV A velocity: 76.00 cm/s  SHUNTS MV E/A ratio:  1.22        Systemic VTI:  0.29 m Systemic Diam: 2.30 cm  Oneil Parchment MD Electronically signed by Oneil Parchment MD Signature Date/Time: 05/19/2021/3:14:38 PM    Final    MONITORS  CARDIAC EVENT MONITOR 06/14/2021  Narrative The predominant rhythm was sinus.  There were no sustained arrhythmias. Rare atrial and ventricular ectopy Symptoms of fluttering and fatigue did not correlate with a sustained arrhythmia   CT SCANS  CT CORONARY MORPH W/CTA COR W/SCORE 03/30/2021  Addendum 03/30/2021  8:16 PM ADDENDUM REPORT: 03/30/2021 20:14  HISTORY: Chest pain, nonspecific  EXAM: Cardiac/Coronary CTA  TECHNIQUE: The patient was scanned on a [employer].  PROTOCOL: A 110 kV prospective scan was triggered in the descending thoracic aorta at 111 HU's. Axial non-contrast 3 mm slices were carried  out through the heart. The data set was analyzed on a dedicated work station and scored using the Agatson method. Gantry rotation speed was 250 msecs and collimation was .6 mm. Beta blockade and 0.8 mg of sl NTG was given. The 3D data set was reconstructed in 5% intervals of the 35-75 % of the R-R cycle. Diastolic phases were analyzed on a dedicated work station using MPR, MIP and VRT modes. The patient received 95mL OMNIPAQUE  IOHEXOL  350 MG/ML SOLN of contrast.  FINDINGS: Quality: Very good, HR 50  Coronary calcium score: The patient's coronary artery calcium score is 22.3, which places the patient in the 61st percentile.  Coronary arteries: Normal coronary origins.  Right dominance.  Right Coronary Artery: Dominant.  No disease.  Left Main Coronary Artery: Normal. Bifurcates into the LAD and LCx arteries.  Left Anterior Descending Coronary Artery: Anterior vessel that wraps around the apex. Small high D1 branch has a mild mixed 25-49% ostial calcified stenosis (CADRADS2). Larger D2 branch is without disease.  Left Circumflex Artery: AV groove branch, minimal proximal 1-24% mixed stenosis (CADRADS1). Large high OM1 branch without disease.  Aorta: Normal size, 33 mm at the mid ascending aorta (level of the PA bifurcation) measured double oblique. No calcifications. No dissection.  Aortic Valve: Trileaflet.  No calcifications.  Other findings:  Normal pulmonary vein drainage into  the left atrium.  Normal left atrial appendage without a thrombus.  Dilated main pulmonary artery at 30 mm, suggestive of pulmonary hypertension.  IMPRESSION: 1. Minimal to mild non-obstructive mixed CAD, CADRADS = 2.  2. Coronary calcium score of 22.3. This was 61st percentile for age and sex matched control.  3. Normal coronary origin with right dominance.  4. Dilated main pulmonary artery at 30 mm, suggestive of pulmonary hypertension.  5. Aggressive cardiovascular risk reduction is  recommended. Consider non-coronary causes of chest pain.   Electronically Signed By: Vinie JAYSON Maxcy M.D. On: 03/30/2021 20:14  Narrative EXAM: OVER-READ INTERPRETATION  CT CHEST  The following report is an over-read performed by radiologist Dr. Franky Crease of [facility] on 03/30/2021. This over-read does not include interpretation of cardiac or coronary anatomy or pathology. The coronary CTA interpretation by the cardiologist is attached.  COMPARISON:  None.  FINDINGS: Vascular: Heart is normal size.  Aorta normal caliber.  Mediastinum/Nodes: No adenopathy  Lungs/Pleura: No confluent opacities or effusions.  Upper Abdomen: Insert for abdomen  Musculoskeletal: Chest wall soft tissues are unremarkable. No acute bony abnormality.  IMPRESSION: No acute or significant extracardiac abnormality.  Electronically Signed: By: Franky Crease M.D. On: 03/30/2021 15:04     ______________________________________________________________________________________________     Recent Labs: 02/22/2024: ALT 22; BUN 19; Creatinine, Ser 0.77; Hemoglobin 13.7; Platelets 170; Potassium 3.4; Sodium 136  Recent Lipid Panel    Component Value Date/Time   CHOL 165 11/15/2015 0640   TRIG 165 (H) 11/15/2015 0640   HDL 35 (L) 11/15/2015 0640   CHOLHDL 4.7 11/15/2015 0640   VLDL 33 11/15/2015 0640   LDLCALC 97 11/15/2015 0640    History of Present Illness   73 year old female with the above past medical history including mild nonobstructive CAD, hypertension, hyperlipidemia, type 2 diabetes, seizure disorder, hypothyroidism, asthma, fibromyalgia, and GERD.   Coronary CT angiogram in 2022 revealed coronary calcium score of 22.3 (61st percentile), mild nonobstructive CAD.  Echocardiogram in 04/2021 showed EF 64%, normal LV function, no RWMA, G1 DD, normal RV systolic function, normal PASP, no significant valvular abnormalities.  Cardiac monitor in 05/2021 showed predominantly normal  sinus rhythm, no significant arrhythmia.  She was evaluated in the ED in 12/2023 in the setting of chest pain.  Workup was unremarkable.  She was last seen in the office on 01/21/2024 and was stable from a cardiac standpoint.  It was noted that her chest pain was noncardiac.  He was evaluated in the ED in 01/2024 following a tick bite.  She was treated with doxycycline .  She presents today for follow-up.  Since her last visit  Nonobstructive CAD: Hypertension: Hyperlipidemia: Type 2 diabetes: Seizure disorder: Hypothyroidism: Disposition:   Home Medications    Current Outpatient Medications  Medication Sig Dispense Refill   albuterol  (PROVENTIL  HFA;VENTOLIN  HFA) 108 (90 Base) MCG/ACT inhaler Inhale 1-2 puffs into the lungs every 6 (six) hours as needed for wheezing or shortness of breath. 1 Inhaler 0   Blood Pressure Monitor KIT Use to check blood pressure at least once daily 1 kit 0   docusate sodium  (COLACE) 100 MG capsule Take 100 mg by mouth 2 (two) times daily.     hydrochlorothiazide  (HYDRODIURIL ) 25 MG tablet TAKE 1 TABLET (25 MG TOTAL) BY MOUTH DAILY. CALL TO SCHEDULE OFFICE VISIT FOR REFILLS 15 tablet 0   lamoTRIgine (LAMICTAL) 100 MG tablet Take 100 mg by mouth 2 (two) times daily.     nitroGLYCERIN  (NITROSTAT ) 0.4 MG SL tablet  Place 0.4 mg under the tongue as directed.     olmesartan  (BENICAR ) 5 MG tablet Take 1 tablet (5 mg total) by mouth daily. (Patient not taking: Reported on 01/21/2024) 30 tablet 5   oxyCODONE -acetaminophen  (PERCOCET/ROXICET) 5-325 MG tablet Take 1 tablet by mouth 3 (three) times daily as needed.     pantoprazole  (PROTONIX ) 20 MG tablet Take 2 tablets (40 mg total) by mouth daily for 14 days. (Patient not taking: Reported on 01/21/2024) 28 tablet 0   phenytoin (DILANTIN) 100 MG ER capsule Take 100 mg by mouth 3 (three) times daily.     polyethylene glycol (MIRALAX  / GLYCOLAX ) packet Take 17 g by mouth daily.     No current facility-administered medications for  this visit.     Review of Systems    ***.  All other systems reviewed and are otherwise negative except as noted above.    Physical Exam    VS:  There were no vitals taken for this visit. , BMI There is no height or weight on file to calculate BMI.     GEN: Well nourished, well developed, in no acute distress. HEENT: normal. Neck: Supple, no JVD, carotid bruits, or masses. Cardiac: RRR, no murmurs, rubs, or gallops. No clubbing, cyanosis, edema.  Radials/DP/PT 2+ and equal bilaterally.  Respiratory:  Respirations regular and unlabored, clear to auscultation bilaterally. GI: Soft, nontender, nondistended, BS + x 4. MS: no deformity or atrophy. Skin: warm and dry, no rash. Neuro:  Strength and sensation are intact. Psych: Normal affect.  Accessory Clinical Findings    ECG personally reviewed by me today -    - no acute changes.   Lab Results  Component Value Date   WBC 9.0 02/22/2024   HGB 13.7 02/22/2024   HCT 40.9 02/22/2024   MCV 94.9 02/22/2024   PLT 170 02/22/2024   Lab Results  Component Value Date   CREATININE 0.77 02/22/2024   BUN 19 02/22/2024   NA 136 02/22/2024   K 3.4 (L) 02/22/2024   CL 100 02/22/2024   CO2 25 02/22/2024   Lab Results  Component Value Date   ALT 22 02/22/2024   AST 28 02/22/2024   ALKPHOS 126 02/22/2024   BILITOT 0.9 02/22/2024   Lab Results  Component Value Date   CHOL 165 11/15/2015   HDL 35 (L) 11/15/2015   LDLCALC 97 11/15/2015   TRIG 165 (H) 11/15/2015   CHOLHDL 4.7 11/15/2015    Lab Results  Component Value Date   HGBA1C 5.6 09/11/2015    Assessment & Plan    1.  ***  No BP recorded.  {Refresh Note OR Click here to enter BP  :1}***   Damien JAYSON Braver, NP 03/27/2024, 12:45 PM
